# Patient Record
Sex: Male | Born: 1940 | Race: White | Hispanic: No
Health system: Southern US, Community
[De-identification: ages and names within clinical notes are randomized; demographics above are authoritative.]

## PROBLEM LIST (undated history)

## (undated) ENCOUNTER — Emergency Department (HOSPITAL_COMMUNITY): Admission: EM | Payer: Medicare Other

## (undated) DIAGNOSIS — I509 Heart failure, unspecified: Secondary | ICD-10-CM

## (undated) DIAGNOSIS — I82409 Acute embolism and thrombosis of unspecified deep veins of unspecified lower extremity: Secondary | ICD-10-CM

## (undated) DIAGNOSIS — I70229 Atherosclerosis of native arteries of extremities with rest pain, unspecified extremity: Secondary | ICD-10-CM

## (undated) DIAGNOSIS — I998 Other disorder of circulatory system: Secondary | ICD-10-CM

## (undated) DIAGNOSIS — I739 Peripheral vascular disease, unspecified: Secondary | ICD-10-CM

## (undated) DIAGNOSIS — R351 Nocturia: Secondary | ICD-10-CM

## (undated) DIAGNOSIS — M2540 Effusion, unspecified joint: Secondary | ICD-10-CM

## (undated) DIAGNOSIS — I779 Disorder of arteries and arterioles, unspecified: Secondary | ICD-10-CM

## (undated) DIAGNOSIS — I10 Essential (primary) hypertension: Secondary | ICD-10-CM

## (undated) DIAGNOSIS — E785 Hyperlipidemia, unspecified: Secondary | ICD-10-CM

## (undated) DIAGNOSIS — I2699 Other pulmonary embolism without acute cor pulmonale: Secondary | ICD-10-CM

## (undated) HISTORY — DX: Peripheral vascular disease, unspecified: I73.9

## (undated) HISTORY — DX: Disorder of arteries and arterioles, unspecified: I77.9

## (undated) HISTORY — PX: OTHER SURGICAL HISTORY: SHX169

## (undated) HISTORY — DX: Acute embolism and thrombosis of unspecified deep veins of unspecified lower extremity: I82.409

## (undated) HISTORY — DX: Other pulmonary embolism without acute cor pulmonale: I26.99

## (undated) HISTORY — DX: Other disorder of circulatory system: I99.8

## (undated) HISTORY — DX: Atherosclerosis of native arteries of extremities with rest pain, unspecified extremity: I70.229

## (undated) HISTORY — PX: BACK SURGERY: SHX140

---

## 2012-06-13 ENCOUNTER — Ambulatory Visit: Payer: Self-pay

## 2013-07-02 ENCOUNTER — Emergency Department (HOSPITAL_COMMUNITY)
Admission: EM | Admit: 2013-07-02 | Discharge: 2013-07-02 | Disposition: A | Discharge status: Home / Self Care | Payer: Medicare Other | Attending: Emergency Medicine | Admitting: Emergency Medicine

## 2013-07-02 ENCOUNTER — Encounter (HOSPITAL_COMMUNITY): Payer: Self-pay | Admitting: Emergency Medicine

## 2013-07-02 DIAGNOSIS — R42 Dizziness and giddiness: Secondary | ICD-10-CM | POA: Insufficient documentation

## 2013-07-02 DIAGNOSIS — R5381 Other malaise: Secondary | ICD-10-CM | POA: Insufficient documentation

## 2013-07-02 DIAGNOSIS — R5383 Other fatigue: Secondary | ICD-10-CM

## 2013-07-02 DIAGNOSIS — I10 Essential (primary) hypertension: Secondary | ICD-10-CM | POA: Insufficient documentation

## 2013-07-02 DIAGNOSIS — Z79899 Other long term (current) drug therapy: Secondary | ICD-10-CM | POA: Insufficient documentation

## 2013-07-02 DIAGNOSIS — K529 Noninfective gastroenteritis and colitis, unspecified: Secondary | ICD-10-CM

## 2013-07-02 DIAGNOSIS — Z87891 Personal history of nicotine dependence: Secondary | ICD-10-CM | POA: Insufficient documentation

## 2013-07-02 DIAGNOSIS — K5289 Other specified noninfective gastroenteritis and colitis: Secondary | ICD-10-CM | POA: Insufficient documentation

## 2013-07-02 HISTORY — DX: Essential (primary) hypertension: I10

## 2013-07-02 LAB — CBC WITH DIFFERENTIAL/PLATELET
BASOS ABS: 0 10*3/uL (ref 0.0–0.1)
Basophils Relative: 0 % (ref 0–1)
EOS ABS: 0 10*3/uL (ref 0.0–0.7)
EOS PCT: 0 % (ref 0–5)
HCT: 46.1 % (ref 39.0–52.0)
Hemoglobin: 15.3 g/dL (ref 13.0–17.0)
LYMPHS PCT: 5 % — AB (ref 12–46)
Lymphs Abs: 0.8 10*3/uL (ref 0.7–4.0)
MCH: 32.3 pg (ref 26.0–34.0)
MCHC: 33.2 g/dL (ref 30.0–36.0)
MCV: 97.3 fL (ref 78.0–100.0)
Monocytes Absolute: 0.7 10*3/uL (ref 0.1–1.0)
Monocytes Relative: 5 % (ref 3–12)
NEUTROS PCT: 90 % — AB (ref 43–77)
Neutro Abs: 13.9 10*3/uL — ABNORMAL HIGH (ref 1.7–7.7)
PLATELETS: 201 10*3/uL (ref 150–400)
RBC: 4.74 MIL/uL (ref 4.22–5.81)
RDW: 12.7 % (ref 11.5–15.5)
WBC: 15.5 10*3/uL — ABNORMAL HIGH (ref 4.0–10.5)

## 2013-07-02 LAB — URINALYSIS, ROUTINE W REFLEX MICROSCOPIC
BILIRUBIN URINE: NEGATIVE
Glucose, UA: NEGATIVE mg/dL
HGB URINE DIPSTICK: NEGATIVE
Ketones, ur: NEGATIVE mg/dL
Leukocytes, UA: NEGATIVE
Nitrite: NEGATIVE
Protein, ur: 100 mg/dL — AB
Specific Gravity, Urine: 1.028 (ref 1.005–1.030)
Urobilinogen, UA: 0.2 mg/dL (ref 0.0–1.0)
pH: 5 (ref 5.0–8.0)

## 2013-07-02 LAB — COMPREHENSIVE METABOLIC PANEL
ALK PHOS: 49 U/L (ref 39–117)
ALT: 18 U/L (ref 0–53)
AST: 18 U/L (ref 0–37)
Albumin: 4.4 g/dL (ref 3.5–5.2)
BUN: 28 mg/dL — ABNORMAL HIGH (ref 6–23)
CO2: 21 meq/L (ref 19–32)
Calcium: 9.6 mg/dL (ref 8.4–10.5)
Chloride: 105 mEq/L (ref 96–112)
Creatinine, Ser: 1.16 mg/dL (ref 0.50–1.35)
GFR calc Af Amer: 71 mL/min — ABNORMAL LOW (ref >90)
GFR calc non Af Amer: 61 mL/min — ABNORMAL LOW (ref >90)
GLUCOSE: 132 mg/dL — AB (ref 70–99)
POTASSIUM: 4.3 meq/L (ref 3.7–5.3)
Sodium: 141 mEq/L (ref 137–147)
TOTAL PROTEIN: 7.8 g/dL (ref 6.0–8.3)
Total Bilirubin: 0.5 mg/dL (ref 0.3–1.2)

## 2013-07-02 LAB — LIPASE, BLOOD: Lipase: 22 U/L (ref 11–59)

## 2013-07-02 LAB — URINE MICROSCOPIC-ADD ON

## 2013-07-02 MED ORDER — DIPHENOXYLATE-ATROPINE 2.5-0.025 MG PO TABS: 1.0000 | ORAL_TABLET | Freq: Four times a day (QID) | ORAL | Status: DC | PRN

## 2013-07-02 MED ORDER — ONDANSETRON 4 MG PO TBDP: 4.0000 mg | ORAL_TABLET | Freq: Three times a day (TID) | ORAL | Status: DC | PRN

## 2013-07-02 MED ORDER — DIPHENOXYLATE-ATROPINE 2.5-0.025 MG PO TABS
2.0000 | ORAL_TABLET | Freq: Once | ORAL | Status: AC
  Administered 2013-07-02: 2 via ORAL
  Filled 2013-07-02: qty 2
  Filled 2013-07-02: qty 1

## 2013-07-02 MED ORDER — SODIUM CHLORIDE 0.9 % IV BOLUS (SEPSIS)
1000.0000 mL | Freq: Once | INTRAVENOUS | Status: AC
  Administered 2013-07-02: 1000 mL via INTRAVENOUS

## 2013-07-02 MED ORDER — ONDANSETRON HCL 4 MG/2ML IJ SOLN
4.0000 mg | Freq: Once | INTRAMUSCULAR | Status: AC
  Administered 2013-07-02: 4 mg via INTRAVENOUS
  Filled 2013-07-02: qty 2

## 2013-07-02 NOTE — Discharge Instructions (Signed)
Viral Gastroenteritis °Viral gastroenteritis is also called stomach flu. This illness is caused by a certain type of germ (virus). It can cause sudden watery poop (diarrhea) and throwing up (vomiting). This can cause you to lose body fluids (dehydration). This illness usually lasts for 3 to 8 days. It usually goes away on its own. °HOME CARE  °· Drink enough fluids to keep your pee (urine) clear or pale yellow. Drink small amounts of fluids often. °· Ask your doctor how to replace body fluid losses (rehydration). °· Avoid: °· Foods high in sugar. °· Alcohol. °· Bubbly (carbonated) drinks. °· Tobacco. °· Juice. °· Caffeine drinks. °· Very hot or cold fluids. °· Fatty, greasy foods. °· Eating too much at one time. °· Dairy products until 24 to 48 hours after your watery poop stops. °· You may eat foods with active cultures (probiotics). They can be found in some yogurts and supplements. °· Wash your hands well to avoid spreading the illness. °· Only take medicines as told by your doctor. Do not give aspirin to children. Do not take medicines for watery poop (antidiarrheals). °· Ask your doctor if you should keep taking your regular medicines. °· Keep all doctor visits as told. °GET HELP RIGHT AWAY IF:  °· You cannot keep fluids down. °· You do not pee at least once every 6 to 8 hours. °· You are short of breath. °· You see blood in your poop or throw up. This may look like coffee grounds. °· You have belly (abdominal) pain that gets worse or is just in one small spot (localized). °· You keep throwing up or having watery poop. °· You have a fever. °· The patient is a child younger than 3 months, and he or she has a fever. °· The patient is a child older than 3 months, and he or she has a fever and problems that do not go away. °· The patient is a child older than 3 months, and he or she has a fever and problems that suddenly get worse. °· The patient is a baby, and he or she has no tears when crying. °MAKE SURE YOU:    °· Understand these instructions. °· Will watch your condition. °· Will get help right away if you are not doing well or get worse. °Document Released: 09/15/2007 Document Revised: 06/21/2011 Document Reviewed: 01/13/2011 °[employer]® Patient Information ©2014 [employer]. ° °

## 2013-07-02 NOTE — ED Provider Notes (Signed)
CSN: [id number]     Arrival date & time 07/02/13  1247 History   First MD Initiated Contact with Patient 07/02/13 1407     Chief Complaint  Patient presents with  . Vomiting  . Diarrhea  . Weakness  . Dizziness      HPI  Patient presents with nausea vomiting diarrhea. His wife went to a similar episode of the last 2 days. He started last night with nausea vomiting diarrhea. No blood pus or mucus in his stools. Heme-negative nonbilious emesis. No abdominal pain. Minimal abdominal cramping. No travel. He lives in town. He runs a burger joint.  Past Medical History  Diagnosis Date  . Hypertension    History reviewed. No pertinent past surgical history. No family history on file. History  Substance Use Topics  . Smoking status: Former Games developermoker  . Smokeless tobacco: Not on file  . Alcohol Use: No    Review of Systems  Constitutional: Negative for fever, chills, diaphoresis, appetite change and fatigue.  HENT: Negative for mouth sores, sore throat and trouble swallowing.   Eyes: Negative for visual disturbance.  Respiratory: Negative for cough, chest tightness, shortness of breath and wheezing.   Cardiovascular: Negative for chest pain.  Gastrointestinal: Positive for nausea, vomiting and diarrhea. Negative for abdominal distention.  Endocrine: Negative for polydipsia, polyphagia and polyuria.  Genitourinary: Negative for dysuria, frequency and hematuria.  Musculoskeletal: Negative for gait problem.  Skin: Negative for color change, pallor and rash.  Neurological: Negative for dizziness, syncope, light-headedness and headaches.  Hematological: Does not bruise/bleed easily.  Psychiatric/Behavioral: Negative for behavioral problems and confusion.      Allergies  Sulfur  Home Medications   Current Outpatient Rx  Name  Route  Sig  Dispense  Refill  . acetaminophen (TYLENOL) 500 MG tablet   Oral   Take 1,500 mg by mouth every 6 (six) hours as needed.         . ramipril  (ALTACE) 10 MG capsule   Oral   Take 10 mg by mouth daily.         . diphenoxylate-atropine (LOMOTIL) 2.5-0.025 MG per tablet   Oral   Take 1 tablet by mouth 4 (four) times daily as needed for diarrhea or loose stools.   30 tablet   0   . ondansetron (ZOFRAN ODT) 4 MG disintegrating tablet   Oral   Take 1 tablet (4 mg total) by mouth every 8 (eight) hours as needed for nausea.   6 tablet   0    BP 172/55  Pulse 67  Temp(Src) 97.5 F (36.4 C) (Oral)  SpO2 99% Physical Exam  Constitutional: He is oriented to person, place, and time. He appears well-developed and well-nourished. No distress.  HENT:  Head: Normocephalic.  Eyes: Conjunctivae are normal. Pupils are equal, round, and reactive to light. No scleral icterus.  Neck: Normal range of motion. Neck supple. No thyromegaly present.  Cardiovascular: Normal rate and regular rhythm.  Exam reveals no gallop and no friction rub.   No murmur heard. Pulmonary/Chest: Effort normal and breath sounds normal. No respiratory distress. He has no wheezes. He has no rales.  Abdominal: Soft. Bowel sounds are normal. He exhibits no distension. There is no tenderness. There is no rebound.  Abdomen soft and benign.  Musculoskeletal: Normal range of motion.  Neurological: He is alert and oriented to person, place, and time.  Skin: Skin is warm and dry. No rash noted.  Psychiatric: He has a normal mood and affect.  His behavior is normal.    ED Course  Procedures (including critical care time) Labs Review Labs Reviewed  CBC WITH DIFFERENTIAL - Abnormal; Notable for the following:    WBC 15.5 (*)    Neutrophils Relative % 90 (*)    Neutro Abs 13.9 (*)    Lymphocytes Relative 5 (*)    All other components within normal limits  COMPREHENSIVE METABOLIC PANEL - Abnormal; Notable for the following:    Glucose, Bld 132 (*)    BUN 28 (*)    GFR calc non Af Amer 61 (*)    GFR calc Af Amer 71 (*)    All other components within normal limits   URINALYSIS, ROUTINE W REFLEX MICROSCOPIC - Abnormal; Notable for the following:    APPearance CLOUDY (*)    Protein, ur 100 (*)    All other components within normal limits  URINE MICROSCOPIC-ADD ON - Abnormal; Notable for the following:    Bacteria, UA FEW (*)    Casts GRANULAR CAST (*)    All other components within normal limits  LIPASE, BLOOD   Imaging Review No results found.   EKG Interpretation None      MDM   Final diagnoses:  Gastroenteritis    Vision feeling much improved after some hydration, Lomotil, Zofran. Taking by mouth. No additional episodes of diarrhea. Benign abdomen. Plan be home. Clear liquids. Symptomatic treatment Zofran Lomotil. Recheck with bloody stools. Emesis. Decreased urine output. Abdominal pain.    Rolland Porter, MD 07/02/13 [phone]

## 2013-07-02 NOTE — ED Notes (Signed)
Pt c/o n/v/d, weakness and dizziness that started yesterday. Pt states he always has HTN. Pt states that "all I need is an antibiotic and Ill be good".

## 2013-12-12 ENCOUNTER — Ambulatory Visit: Payer: Medicare Other | Admitting: Cardiovascular Disease

## 2013-12-24 ENCOUNTER — Ambulatory Visit (INDEPENDENT_AMBULATORY_CARE_PROVIDER_SITE_OTHER): Payer: Medicare Other | Admitting: Podiatry

## 2013-12-24 ENCOUNTER — Encounter: Payer: Self-pay | Admitting: Podiatry

## 2013-12-24 ENCOUNTER — Telehealth (HOSPITAL_COMMUNITY): Payer: Self-pay | Admitting: *Deleted

## 2013-12-24 ENCOUNTER — Ambulatory Visit (INDEPENDENT_AMBULATORY_CARE_PROVIDER_SITE_OTHER): Payer: Medicare Other

## 2013-12-24 VITALS — BP 179/91 | HR 84 | Resp 16

## 2013-12-24 DIAGNOSIS — L97509 Non-pressure chronic ulcer of other part of unspecified foot with unspecified severity: Secondary | ICD-10-CM

## 2013-12-24 DIAGNOSIS — I739 Peripheral vascular disease, unspecified: Secondary | ICD-10-CM

## 2013-12-24 DIAGNOSIS — M216X9 Other acquired deformities of unspecified foot: Secondary | ICD-10-CM

## 2013-12-24 MED ORDER — HYDROCODONE-ACETAMINOPHEN 10-325 MG PO TABS: 1.0000 | ORAL_TABLET | Freq: Three times a day (TID) | ORAL | Status: DC | PRN

## 2013-12-24 NOTE — Progress Notes (Signed)
   Subjective:    Patient ID: Kevin Reilly, male    DOB: June 21, 1940, 73 y.o.   MRN: [id number]  HPI Comments: "My feet hurt so bad"  Patient c/o throbbing sub 5th MPJ right for about 3 weeks. He had a callus there initially and saw Dr. Elvin So and he numbed and shaved down. Patient states that it was extremely sore afterwards and went back and he cut more skin without numbing. Now red and open sore. Patient asked for antibiotic cream but doc said didn't need it. He hasn't been using anything at home. Both feet get real sore plantar forefoot when walking.     Review of Systems  Musculoskeletal: Positive for gait problem.  All other systems reviewed and are negative.      Objective:   Physical Exam        Assessment & Plan:

## 2013-12-25 NOTE — Progress Notes (Signed)
Subjective:     Patient ID: Kevin Reilly, male   DOB: [birth year], 73 y.o.   MRN: [id number]  HPI patient states that he had a lesion underneath the fifth metatarsal right that has been painful for about 4 weeks and was treated by another physician with injection and trimming and that seems to have made it somewhat worse. Also complains that the forefoot of both feet are hurting him and that it seems like it is getting worse. It can wake him up at night   Review of Systems  All other systems reviewed and are negative.      Objective:   Physical Exam  Nursing note and vitals reviewed. Constitutional: He is oriented to person, place, and time.  Musculoskeletal: Normal range of motion.  Neurological: He is oriented to person, place, and time.  Skin: Skin is warm and dry.   I noted circulatory status to be diminished both DP and PT pulses bilateral with mild clonus to the forefoot of both feet and diminished hair growth. Patient is noted to have normal neurological sensation I did note that there is a small breakdown of tissue around the fifth metatarsal head right measuring about 3 x 3 mm localized in nature with no proximal edema erythema or drainage noted. Patient has irritation around the first metatarsal heads plantar both feet and slightly on the fifth metatarsal head left    Assessment:     Possibility for PVD creating breakdown of tissue versus possibilities of some kind of inflammatory complex    Plan:     H&P and x-rays reviewed. Today I cleaned the area on the fifth metatarsal right did not note any drainage flushed the area and applied a small amount of Iodosorb with padding and gave him pads instructed on padding usage around the areas that are irritated along with soaks and Neosporin for the right fifth metatarsal head. I am sending for vascular evaluation to see whether or not this may be a circulatory issue creating the problems he is experiencing and I gave him strict  instructions if any increased redness swelling or drainage should occur he is to go straight to the emergency room and contact us. If not we will get the results of vascular studies and see him back in 2 weeks

## 2013-12-28 ENCOUNTER — Telehealth: Payer: Self-pay | Admitting: *Deleted

## 2013-12-28 MED ORDER — CEPHALEXIN 500 MG PO CAPS: 500.0000 mg | ORAL_CAPSULE | Freq: Three times a day (TID) | ORAL | Status: DC

## 2013-12-28 NOTE — Telephone Encounter (Signed)
Kevin Reilly, thank you.

## 2013-12-28 NOTE — Telephone Encounter (Signed)
I'm trying to get in touch with Dr. Charlsie Merles.  Thank you!  I attempted to call the patient with no success, he wasn't at work and voicemail was full on his mobile.

## 2013-12-28 NOTE — Telephone Encounter (Signed)
Kevin Reilly, call me back.

## 2013-12-28 NOTE — Telephone Encounter (Signed)
I called and informed the patient that I tried to call him back around 2pm.  He stated, "I had to step out a minute."  I informed him that Dr. Charlsie Merles said he gave you a prescription for pain medication already.  He said he can give you an antibiotic.  I will send it to your pharmacy.  He stated, "So he's cutting me off huh.  I been taking 2 pills at a time sometime because the pain is so bad.  Hopefully the antibiotic will help."

## 2013-12-28 NOTE — Telephone Encounter (Signed)
I'm trying to get in touch with Dr. Charlsie Merles.  What it is, I need an antibiotic and some pain medicine.  My foot is still hurting.  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  Thank you.

## 2013-12-31 ENCOUNTER — Telehealth: Payer: Self-pay | Admitting: *Deleted

## 2013-12-31 NOTE — Telephone Encounter (Signed)
I'm trying to get in touch with Dr. Charlsie Merles to find out if you called something in for me.  I'm fixing to leave from over here.  It don't make no difference to call a doctor.  Call was taken care of on Friday.

## 2014-01-01 ENCOUNTER — Encounter: Payer: Self-pay | Admitting: Cardiovascular Disease

## 2014-01-01 ENCOUNTER — Ambulatory Visit (HOSPITAL_COMMUNITY)
Admission: RE | Admit: 2014-01-01 | Discharge: 2014-01-01 | Disposition: A | Discharge status: Home / Self Care | Payer: Medicare Other | Source: Ambulatory Visit | Attending: Cardiovascular Disease | Admitting: Cardiovascular Disease

## 2014-01-01 ENCOUNTER — Ambulatory Visit (INDEPENDENT_AMBULATORY_CARE_PROVIDER_SITE_OTHER): Payer: Medicare Other | Admitting: Cardiovascular Disease

## 2014-01-01 VITALS — BP 210/67 | HR 50 | Ht 71.0 in | Wt 186.3 lb

## 2014-01-01 DIAGNOSIS — I70229 Atherosclerosis of native arteries of extremities with rest pain, unspecified extremity: Secondary | ICD-10-CM

## 2014-01-01 DIAGNOSIS — I739 Peripheral vascular disease, unspecified: Secondary | ICD-10-CM | POA: Diagnosis not present

## 2014-01-01 DIAGNOSIS — I10 Essential (primary) hypertension: Secondary | ICD-10-CM | POA: Insufficient documentation

## 2014-01-01 DIAGNOSIS — M79609 Pain in unspecified limb: Secondary | ICD-10-CM | POA: Insufficient documentation

## 2014-01-01 DIAGNOSIS — R0989 Other specified symptoms and signs involving the circulatory and respiratory systems: Secondary | ICD-10-CM

## 2014-01-01 DIAGNOSIS — R600 Localized edema: Secondary | ICD-10-CM | POA: Insufficient documentation

## 2014-01-01 DIAGNOSIS — I999 Unspecified disorder of circulatory system: Secondary | ICD-10-CM

## 2014-01-01 DIAGNOSIS — I998 Other disorder of circulatory system: Secondary | ICD-10-CM | POA: Insufficient documentation

## 2014-01-01 DIAGNOSIS — L97509 Non-pressure chronic ulcer of other part of unspecified foot with unspecified severity: Secondary | ICD-10-CM | POA: Diagnosis not present

## 2014-01-01 MED ORDER — HYDROCHLOROTHIAZIDE 12.5 MG PO CAPS: 12.5000 mg | ORAL_CAPSULE | Freq: Every day | ORAL | Status: DC

## 2014-01-01 NOTE — Assessment & Plan Note (Signed)
On Altace. His blood pressure today is 210/67. He does have 2-3+ pitting lower extremity edema. I'm going to start him on low-dose hydrochlorothiazide and with recheck lab work in 2-3 weeks.

## 2014-01-01 NOTE — Telephone Encounter (Signed)
He should be scheduled for vascular study. Confirm that he has appointment

## 2014-01-01 NOTE — Progress Notes (Signed)
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 He was referred by Dr. Charlsie Merles for evaluation of critical limb ischemia. He has a history of 30-60 pack years of tobacco abuse having quit 20 years ago. History of hypertension as well. Never had a heart attack or stroke, denies chest pain or shortness of breath. He developed chronic pain in his right foot up until a month ago with erythema. He had instrumentation of the foot now has a nonhealing ulcer on the lateral plantar surface. Lower extremity arterial Doppler studies performed in our office today revealed an occluded right common femoral artery and right SFA. His left ABI is 0.43 with high-grade left common femoral artery stenosis, common iliac artery stenosis and an occluded SFA at the origin.   Current Outpatient Prescriptions  Medication Sig Dispense Refill  . acetaminophen (TYLENOL) 500 MG tablet Take 1,500 mg by mouth every 6 (six) hours as needed.      . cephALEXin (KEFLEX) 500 MG capsule Take 1 capsule (500 mg total) by mouth 3 (three) times daily.  30 capsule  0  . HYDROcodone-acetaminophen (NORCO) 10-325 MG per tablet Take 1 tablet by mouth every 8 (eight) hours as needed.  30 tablet  0  . ramipril (ALTACE) 10 MG capsule Take 10 mg by mouth daily.      . hydrochlorothiazide (MICROZIDE) 12.5 MG capsule Take 1 capsule (12.5 mg total) by mouth daily.  90 capsule  3   No current facility-administered medications for this visit.    Allergies  Allergen Reactions  . Sulfur Rash    History   Social History  . Marital Status: Married    Spouse Name: N/A    Number of Children: N/A  . Years of Education: N/A   Occupational History  . Not on file.   Social History Main Topics  . Smoking status: Never Smoker    . Smokeless tobacco: Not on file  . Alcohol Use: No  . Drug Use: Not on file  . Sexual Activity: Not on file   Other Topics Concern  . Not on file   Social History Narrative  . No narrative on file     Review of Systems: General: negative for chills, fever, night sweats or weight changes.  Cardiovascular: negative for chest pain, dyspnea on exertion, edema, orthopnea, palpitations, paroxysmal nocturnal dyspnea or shortness of breath Dermatological: negative for rash Respiratory: negative for cough or wheezing Urologic: negative for hematuria Abdominal: negative for nausea, vomiting, diarrhea, bright red blood per rectum, melena, or hematemesis Neurologic: negative for visual changes, syncope, or dizziness All other systems reviewed and are otherwise negative except as noted above.    Blood pressure 210/67, pulse 50, height 5\' 11"  (1.803 m), weight 186 lb 4.8 oz (84.505 kg).  General appearance: alert and no distress Neck: no adenopathy, no JVD, supple, symmetrical, trachea midline, thyroid not enlarged, symmetric, no tenderness/mass/nodules and bilateral carotid bruits Lungs: clear to auscultation bilaterally Heart: regular rate and rhythm, S1, S2 normal, no murmur, click, rub or gallop Extremities: 2+ pitting edema, erythema right foot, ulcer lateral plantar aspect right foot  EKG not performed today  ASSESSMENT AND PLAN:   Essential hypertension On Altace. His blood pressure today is 210/67. He does have 2-3+ pitting lower extremity edema. I'm  going to start him on low-dose hydrochlorothiazide and with recheck lab work in 2-3 weeks.  Critical lower limb ischemia Patient was referred to Dr. Verlan Friends for critical limb ischemia. He is an ulcer on the plantar aspect of his right fifth toe after instrumentation. He has erythema of his right foot and chronic pain for the last one month. Lower extremity until Doppler studies were office today reveal an occluded right common  femoral artery and SFA. He'll be angiography and potential intervention.      Runell Gess MD FACP,FACC,FAHA, FSCAI 01/01/2014 4:09 PM '

## 2014-01-01 NOTE — Patient Instructions (Signed)
  We will see you back in follow up in 6 weeks with Dr Allyson Sabal.   Dr Allyson Sabal has ordered: Carotid Duplex- This test is an ultrasound of the carotid arteries in your neck. It looks at blood flow through these arteries that supply the brain with blood. Allow one hour for this exam. There are no restrictions or special instructions.   Start Hydrocholorothiazide 12.5mg  daily for your swelling and high blood pressure.

## 2014-01-01 NOTE — Assessment & Plan Note (Signed)
Patient was referred to Dr. Verlan Friends for critical limb ischemia. He is an ulcer on the plantar aspect of his right fifth toe after instrumentation. He has erythema of his right foot and chronic pain for the last one month. Lower extremity until Doppler studies were office today reveal an occluded right common femoral artery and SFA. He'll be angiography and potential intervention.

## 2014-01-01 NOTE — Progress Notes (Signed)
Lower Extremity Arterial Duplex Completed. Evidence for moderate to severe arterial insufficiency bilaterally. Brianna L Mazza,RVT

## 2014-01-03 ENCOUNTER — Telehealth: Payer: Self-pay | Admitting: *Deleted

## 2014-01-03 NOTE — Telephone Encounter (Signed)
I called the patient and asked how he was doing per Dr. Charlsie Merles.  He stated, "I'm canceling all of my appointments right now all of them.  I told him that Dr. Charlsie Merles wants to stress the importance of following through with Dr. Allyson Sabal, he's concerned about you losing a foot or leg.  He stated, "I'm canceling all my appointments right now!"  I tried to stress the importance again but he cut me off and said, "I'm canceling all my appointments for now, thanks for calling, good-bye."  I informed Dr. Charlsie Merles.

## 2014-01-04 ENCOUNTER — Telehealth: Payer: Self-pay | Admitting: *Deleted

## 2014-01-04 NOTE — Telephone Encounter (Signed)
I'm going to cancel my appointment for Monday.  Thank you.  I called and asked the patient if he wanted to reschedule his appointment.  He stated, "No I'm scheduled to see Dr. Allyson Sabal on Tuesday so I thought I'd take care of everything with him.  But I would like a refill for my pain medication.  I'm out of it and my feet are killing me plus I got some issues with my legs.  If he can call into my pharmacy."  I told him Dr. Charlsie Merles is not in the office.  I will try to get in touch with him but can't guarantee.  I told him if Dr. Leitha Bleak a prescription it can't be called into the pharmacy, it will have to be picked up from the office because it is a controlled substance.  He stated, "Okay."

## 2014-01-04 NOTE — Telephone Encounter (Signed)
I already left my name and everything.  Y'all ain't call me back back.  I called and informed him I wasn't able to get in touch with Dr. Charlsie Merles not able to prescribe any pain medication.  He stated, "Okay, bye."

## 2014-01-07 ENCOUNTER — Ambulatory Visit: Payer: Medicare Other | Admitting: Podiatry

## 2014-01-07 ENCOUNTER — Telehealth: Payer: Self-pay | Admitting: Cardiovascular Disease

## 2014-01-07 NOTE — Telephone Encounter (Signed)
Closed encounter °

## 2014-01-08 ENCOUNTER — Ambulatory Visit (HOSPITAL_COMMUNITY)
Admission: RE | Admit: 2014-01-08 | Discharge: 2014-01-08 | Disposition: A | Discharge status: Home / Self Care | Payer: Medicare Other | Source: Ambulatory Visit | Attending: Cardiovascular Disease | Admitting: Cardiovascular Disease

## 2014-01-08 DIAGNOSIS — R0989 Other specified symptoms and signs involving the circulatory and respiratory systems: Secondary | ICD-10-CM | POA: Diagnosis present

## 2014-01-08 NOTE — Progress Notes (Signed)
Carotid Duplex Completed. °Brianna L Mazza,RVT °

## 2014-01-18 ENCOUNTER — Telehealth: Payer: Self-pay | Admitting: *Deleted

## 2014-01-18 NOTE — Telephone Encounter (Signed)
I can't remember the doctor's name.  I'm trying to see if he'll put me on an antibiotic because my foot is turning red.  I'd like to get on an antibiotic.  I attempted to call the patient, his line was busy.

## 2014-01-22 NOTE — Telephone Encounter (Signed)
This patient needs to be seen

## 2014-01-22 NOTE — Telephone Encounter (Signed)
I called and informed the patient that Dr. Charlsie Merles wants you to schedule an appointment to see him.  He asked, "When, what do you have?  I told him I was going to transfer him to a scheduler.

## 2014-12-20 ENCOUNTER — Other Ambulatory Visit: Payer: Self-pay | Admitting: *Deleted

## 2014-12-20 DIAGNOSIS — L97513 Non-pressure chronic ulcer of other part of right foot with necrosis of muscle: Secondary | ICD-10-CM

## 2015-01-13 ENCOUNTER — Encounter: Payer: Medicare Other | Admitting: Surgery

## 2015-01-13 ENCOUNTER — Encounter (HOSPITAL_COMMUNITY): Payer: Medicare Other

## 2015-01-21 ENCOUNTER — Encounter: Payer: Self-pay | Admitting: Vascular Surgery

## 2015-01-23 ENCOUNTER — Encounter: Payer: Self-pay | Admitting: Vascular Surgery

## 2015-01-23 ENCOUNTER — Other Ambulatory Visit: Payer: Self-pay | Admitting: *Deleted

## 2015-01-23 ENCOUNTER — Ambulatory Visit (INDEPENDENT_AMBULATORY_CARE_PROVIDER_SITE_OTHER): Payer: Medicare Other | Admitting: Vascular Surgery

## 2015-01-23 ENCOUNTER — Ambulatory Visit (HOSPITAL_COMMUNITY)
Admission: RE | Admit: 2015-01-23 | Discharge: 2015-01-23 | Disposition: A | Discharge status: Home / Self Care | Payer: Medicare Other | Source: Ambulatory Visit | Attending: Vascular Surgery | Admitting: Vascular Surgery

## 2015-01-23 ENCOUNTER — Encounter: Payer: Self-pay | Admitting: *Deleted

## 2015-01-23 VITALS — BP 199/66 | HR 53 | Temp 98.2°F | Ht 71.0 in | Wt 179.0 lb

## 2015-01-23 DIAGNOSIS — I739 Peripheral vascular disease, unspecified: Secondary | ICD-10-CM | POA: Diagnosis not present

## 2015-01-23 DIAGNOSIS — M79671 Pain in right foot: Secondary | ICD-10-CM

## 2015-01-23 DIAGNOSIS — R0989 Other specified symptoms and signs involving the circulatory and respiratory systems: Secondary | ICD-10-CM

## 2015-01-23 DIAGNOSIS — I10 Essential (primary) hypertension: Secondary | ICD-10-CM | POA: Diagnosis not present

## 2015-01-23 DIAGNOSIS — L97513 Non-pressure chronic ulcer of other part of right foot with necrosis of muscle: Secondary | ICD-10-CM | POA: Insufficient documentation

## 2015-01-23 MED ORDER — HYDROCODONE-ACETAMINOPHEN 10-325 MG PO TABS: 1.0000 | ORAL_TABLET | Freq: Three times a day (TID) | ORAL | Status: DC | PRN

## 2015-01-23 NOTE — Progress Notes (Signed)
[facility] [place] HISTORY AND PHYSICAL   History of Present Illness:  Patient is a 74 y.o. year old male who presents for evaluation of a chronic painful right fifth metatarsal ulcer. The patient states he has had this ulcer for several years. It was initially followed by a podiatrist. They told him he may have osteomyelitis. At that point he wished to see Dr. Lajoyce Corners for second opinion. Dr. Lajoyce Corners recently saw him in the office and referred him to [place] due to concerns over decreased perfusion in the right leg. The patient currently controls the pain in his right foot with a Lidoderm patch. He has pain right over the right fifth metatarsal. He does not really complain of drainage from this. He actively works daily at [employer]. He denies claudication symptoms. He denies prior myocardial infarction or stroke. He does have a history of hypertension which is controlled. He is a former tobacco abuser but quit 30 years ago. He also has chronic lower extremity edema. 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    Past Medical History  Diagnosis Date  . Hypertension   . Critical lower limb ischemia   . Peripheral arterial disease [facility])     Past Surgical History  Procedure Laterality Date  . Back surgery      Social History Social History  Substance Use Topics  . Smoking status: Former Smoker    Types: Cigarettes    Quit date: 01/22/1985  . Smokeless tobacco: None  . Alcohol Use: No    Family History No family history on file.  Allergies  Allergies  Allergen Reactions  . Sulfur Rash     Current Outpatient Prescriptions  Medication Sig Dispense Refill  . acetaminophen (TYLENOL) 500 MG tablet Take 1,500 mg by mouth every 6 (six) hours as needed.    Marland Kitchen HYDROcodone-acetaminophen (NORCO) 10-325 MG per tablet Take 1 tablet by mouth every 8 (eight) hours as needed. 30 tablet 0  . ramipril (ALTACE) 10 MG capsule Take 10 mg by mouth daily.    . cephALEXin (KEFLEX) 500  MG capsule Take 1 capsule (500 mg total) by mouth 3 (three) times daily. (Patient not taking: Reported on 01/23/2015) 30 capsule 0  . hydrochlorothiazide (MICROZIDE) 12.5 MG capsule Take 1 capsule (12.5 mg total) by mouth daily. (Patient not taking: Reported on 01/23/2015) 90 capsule 3   No current facility-administered medications for this visit.    ROS:   General:  No weight loss, Fever, chills  HEENT: No recent headaches, no nasal bleeding, no visual changes, no sore throat  Neurologic: No dizziness, blackouts, seizures. No recent symptoms of stroke or mini- stroke. No recent episodes of slurred speech, or temporary blindness.  Cardiac: No recent episodes of chest pain/pressure, no shortness of breath at rest.  No shortness of breath with exertion.  Denies history of atrial fibrillation or irregular heartbeat  Vascular: No history of rest pain in feet.  No history of claudication.  + history of non-healing ulcer, No history of DVT   Pulmonary: No home oxygen, no productive cough, no hemoptysis,  No asthma or wheezing  Musculoskeletal:  [x ] Arthritis, [ ]  Low back pain,  [ ]  Joint pain  Hematologic:No history of hypercoagulable state.  No history of easy bleeding.  No history of anemia  Gastrointestinal: No hematochezia or melena,  No gastroesophageal reflux, no trouble swallowing  Urinary: [ ]  chronic Kidney disease, [ ]  on HD - [ ]  MWF or [ ]   TTHS, [ ] Burning with urination, [ ] Frequent urination, [ ] Difficulty urinating;   Skin: No rashes  Psychological: No history of anxiety,  No history of depression   Physical Examination  Filed Vitals:   01/23/15 1517 01/23/15 1521  BP: 198/73 199/66  Pulse: 53   Temp: 98.2 F (36.8 C)   TempSrc: Oral   Height: 5' 11" (1.803 m)   Weight: 179 lb (81.194 kg)   SpO2: 99%     Body mass index is 24.98 kg/(m^2).  General:  Alert and oriented, no acute distress HEENT: Normal Neck: Bilateral carotid bruits left greater than  right Pulmonary: Clear to auscultation bilaterally Cardiac: Regular Rate and Rhythm without murmur Abdomen: Soft, non-tender, non-distended, no mass Skin: Diffuse circumferential hemosiderin staining gaiter area bilateral lower extremities, 2+ edema right lower extremity 1+ edema left lower extremity, punctate ulcer over right fifth metatarsal head plantar aspect of foot no drainage no erythema Extremity Pulses:  2+ radial, brachial, femoral, absent popliteal dorsalis pedis, posterior tibial pulses bilaterally Musculoskeletal:  edema as mentioned above  Neurologic: Upper and lower extremity motor 5/5 and symmetric  DATA:   Patient had bilateral ABIs performed today which were 0.3 on the right 0.52 on the left. I reviewed and interpreted this study. The patient also recently had a duplex ultrasound on September 22 at [facility]. This showed bilateral superficial femoral artery occlusions. There was also common femoral disease on the right side as well as the left   ASSESSMENT:  Severe bilateral lower extremity arterial occlusive disease. Possible osteomyelitis right foot #2 bilateral asymptomatic carotid bruits   PLAN:  #1 aortogram with bilateral lower extremity runoff possible intervention tomorrow. Risks benefits possible complications and procedure details of the arteriogram or intervention were discussed with the patient today. #2 possible osteomyelitis right foot. We will try to obtain an MRI exam of his right foot tomorrow while he is on his bed rest. #3 bilateral carotid bruits the patient will need a carotid duplex scan to evaluate to make sure he does not have high-grade carotid stenosis. Patient was given a prescription today for Vicodin No. 40 dispensed. He will also need to start aspirin daily. He would also benefit from a statin.  Charles Fields, MD [facility] [place] Office: [phone] Pager: [phone]   

## 2015-01-24 ENCOUNTER — Other Ambulatory Visit: Payer: Self-pay | Admitting: *Deleted

## 2015-01-24 ENCOUNTER — Ambulatory Visit (HOSPITAL_BASED_OUTPATIENT_CLINIC_OR_DEPARTMENT_OTHER): Payer: Medicare Other

## 2015-01-24 ENCOUNTER — Ambulatory Visit (HOSPITAL_COMMUNITY)
Admission: RE | Admit: 2015-01-24 | Discharge: 2015-01-24 | Disposition: A | Discharge status: Home / Self Care | Payer: Medicare Other | Source: Ambulatory Visit | Attending: Vascular Surgery | Admitting: Vascular Surgery

## 2015-01-24 ENCOUNTER — Encounter (HOSPITAL_COMMUNITY)
Admission: RE | Disposition: A | Discharge status: Home / Self Care | Payer: Self-pay | Source: Ambulatory Visit | Attending: Vascular Surgery

## 2015-01-24 DIAGNOSIS — I10 Essential (primary) hypertension: Secondary | ICD-10-CM | POA: Insufficient documentation

## 2015-01-24 DIAGNOSIS — I70229 Atherosclerosis of native arteries of extremities with rest pain, unspecified extremity: Secondary | ICD-10-CM

## 2015-01-24 DIAGNOSIS — Z01818 Encounter for other preprocedural examination: Secondary | ICD-10-CM

## 2015-01-24 DIAGNOSIS — I998 Other disorder of circulatory system: Secondary | ICD-10-CM

## 2015-01-24 DIAGNOSIS — I6523 Occlusion and stenosis of bilateral carotid arteries: Secondary | ICD-10-CM | POA: Diagnosis not present

## 2015-01-24 DIAGNOSIS — I739 Peripheral vascular disease, unspecified: Secondary | ICD-10-CM

## 2015-01-24 DIAGNOSIS — Z882 Allergy status to sulfonamides status: Secondary | ICD-10-CM | POA: Insufficient documentation

## 2015-01-24 DIAGNOSIS — R600 Localized edema: Secondary | ICD-10-CM | POA: Diagnosis not present

## 2015-01-24 DIAGNOSIS — L97819 Non-pressure chronic ulcer of other part of right lower leg with unspecified severity: Secondary | ICD-10-CM | POA: Diagnosis not present

## 2015-01-24 DIAGNOSIS — Z87891 Personal history of nicotine dependence: Secondary | ICD-10-CM | POA: Diagnosis not present

## 2015-01-24 DIAGNOSIS — L97519 Non-pressure chronic ulcer of other part of right foot with unspecified severity: Secondary | ICD-10-CM | POA: Diagnosis not present

## 2015-01-24 HISTORY — PX: PERIPHERAL VASCULAR CATHETERIZATION: SHX172C

## 2015-01-24 LAB — POCT I-STAT, CHEM 8
BUN: 27 mg/dL — ABNORMAL HIGH (ref 6–20)
CREATININE: 0.9 mg/dL (ref 0.61–1.24)
Calcium, Ion: 1.26 mmol/L (ref 1.13–1.30)
Chloride: 106 mmol/L (ref 101–111)
Glucose, Bld: 102 mg/dL — ABNORMAL HIGH (ref 65–99)
HEMATOCRIT: 39 % (ref 39.0–52.0)
HEMOGLOBIN: 13.3 g/dL (ref 13.0–17.0)
POTASSIUM: 4 mmol/L (ref 3.5–5.1)
Sodium: 142 mmol/L (ref 135–145)
TCO2: 24 mmol/L (ref 0–100)

## 2015-01-24 SURGERY — ABDOMINAL AORTOGRAM
Anesthesia: LOCAL

## 2015-01-24 MED ORDER — HEPARIN (PORCINE) IN NACL 2-0.9 UNIT/ML-% IJ SOLN
INTRAMUSCULAR | Status: DC | PRN
  Administered 2015-01-24: 11:00:00

## 2015-01-24 MED ORDER — OXYCODONE HCL 5 MG PO TABS
ORAL_TABLET | ORAL | Status: AC
  Filled 2015-01-24: qty 2

## 2015-01-24 MED ORDER — ACETAMINOPHEN 325 MG PO TABS: 325.0000 mg | ORAL_TABLET | ORAL | Status: DC | PRN

## 2015-01-24 MED ORDER — HEPARIN (PORCINE) IN NACL 2-0.9 UNIT/ML-% IJ SOLN
INTRAMUSCULAR | Status: AC
  Filled 2015-01-24: qty 1000

## 2015-01-24 MED ORDER — MORPHINE SULFATE (PF) 10 MG/ML IV SOLN: 2.0000 mg | INTRAVENOUS | Status: DC | PRN

## 2015-01-24 MED ORDER — ACETAMINOPHEN 325 MG RE SUPP: 325.0000 mg | RECTAL | Status: DC | PRN

## 2015-01-24 MED ORDER — MORPHINE SULFATE (PF) 2 MG/ML IV SOLN
INTRAVENOUS | Status: AC
  Administered 2015-01-24: 2 mg via INTRAVENOUS
  Filled 2015-01-24: qty 1

## 2015-01-24 MED ORDER — OXYCODONE HCL 5 MG PO TABS
5.0000 mg | ORAL_TABLET | ORAL | Status: DC | PRN
  Administered 2015-01-24: 10 mg via ORAL

## 2015-01-24 MED ORDER — HYDRALAZINE HCL 20 MG/ML IJ SOLN
INTRAMUSCULAR | Status: AC
  Filled 2015-01-24: qty 1

## 2015-01-24 MED ORDER — HYDRALAZINE HCL 20 MG/ML IJ SOLN: 5.0000 mg | INTRAMUSCULAR | Status: DC | PRN

## 2015-01-24 MED ORDER — SODIUM CHLORIDE 0.9 % IV SOLN
INTRAVENOUS | Status: DC
  Administered 2015-01-24: 10:00:00 via INTRAVENOUS

## 2015-01-24 MED ORDER — LABETALOL HCL 5 MG/ML IV SOLN: 10.0000 mg | INTRAVENOUS | Status: DC | PRN

## 2015-01-24 MED ORDER — SODIUM CHLORIDE 0.45 % IV SOLN
INTRAVENOUS | Status: DC
  Administered 2015-01-24: 12:00:00 via INTRAVENOUS

## 2015-01-24 MED ORDER — LIDOCAINE HCL (PF) 1 % IJ SOLN
INTRAMUSCULAR | Status: AC
  Filled 2015-01-24: qty 30

## 2015-01-24 MED ORDER — METOPROLOL TARTRATE 1 MG/ML IV SOLN: 2.0000 mg | INTRAVENOUS | Status: DC | PRN

## 2015-01-24 MED ORDER — HYDRALAZINE HCL 20 MG/ML IJ SOLN
INTRAMUSCULAR | Status: DC | PRN
  Administered 2015-01-24: 10 mg via INTRAVENOUS

## 2015-01-24 MED ORDER — IODIXANOL 320 MG/ML IV SOLN
INTRAVENOUS | Status: DC | PRN
  Administered 2015-01-24: 127 mL via INTRA_ARTERIAL

## 2015-01-24 MED ORDER — ONDANSETRON HCL 4 MG/2ML IJ SOLN: 4.0000 mg | Freq: Four times a day (QID) | INTRAMUSCULAR | Status: DC | PRN

## 2015-01-24 SURGICAL SUPPLY — 9 items
CATH ANGIO 5F PIGTAIL 65CM (CATHETERS) ×2 IMPLANT
COVER PRB 48X5XTLSCP FOLD TPE (BAG) ×1 IMPLANT
COVER PROBE 5X48 (BAG) ×1
KIT PV (KITS) ×2 IMPLANT
SHEATH PINNACLE 5F 10CM (SHEATH) ×2 IMPLANT
SYR MEDRAD MARK V 150ML (SYRINGE) ×2 IMPLANT
TRANSDUCER W/STOPCOCK (MISCELLANEOUS) ×2 IMPLANT
TRAY PV CATH (CUSTOM PROCEDURE TRAY) ×2 IMPLANT
WIRE HITORQ VERSACORE ST 145CM (WIRE) ×2 IMPLANT

## 2015-01-24 NOTE — Progress Notes (Signed)
VASCULAR LAB PRELIMINARY  PRELIMINARY  PRELIMINARY  PRELIMINARY  Carotid duplex completed.    Preliminary report:  Bilateral:  40-59% internal carotid artery stenosis upper end of scale. Acoustic shadowing may obscure higher velocities. Turbubelt flow noted. ECA stenosis. Vertebral artery flow is antegrade.  Kimya Mccahill, RVS 01/24/2015, 7:51 PM

## 2015-01-24 NOTE — Interval H&P Note (Signed)
History and Physical Interval Note:  01/24/2015 9:23 AM  Kevin Reilly  has presented today for surgery, with the diagnosis of pvd  The various methods of treatment have been discussed with the patient and family. After consideration of risks, benefits and other options for treatment, the patient has consented to  Procedure(s): Abdominal Aortogram (N/A) as a surgical intervention .  The patient's history has been reviewed, patient examined, no change in status, stable for surgery.  I have reviewed the patient's chart and labs.  Questions were answered to the patient's satisfaction.     Fabienne Bruns

## 2015-01-24 NOTE — Discharge Instructions (Signed)
Angiogram, Care After These instructions give you information about caring for yourself after your procedure. Your doctor may also give you more specific instructions. Call your doctor if you have any problems or questions after your procedure.  HOME CARE  Take medicines only as told by your doctor.  Follow your doctor's instructions about:  Care of the area where the tube was inserted.  Bandage (dressing) changes and removal.  You may shower 24-48 hours after the procedure or as told by your doctor.  Do not take baths, swim, or use a hot tub until your doctor approves.  Every day, check the area where the tube was inserted. Watch for:  Redness, swelling, or pain.  Fluid, blood, or pus.  Do not apply powder or lotion to the site.  Do not lift anything that is heavier than 10 lb (4.5 kg) for 5 days or as told by your doctor.  Do not drive or operate heavy machinery for 24 hours or as told by your doctor.  Have someone with you for the first 24 hours after the procedure.  Keep all follow-up visits as told by your doctor. This is important. GET HELP IF:  You have a fever.   You have chills.   You have more bleeding from the area where the tube was inserted. Hold pressure on the area and call 911.  You have redness, swelling, or pain in the area where the tube was inserted.  You have fluid or pus coming from the area. GET HELP RIGHT AWAY IF:   You have a lot of pain in the area where the tube was inserted.  The area where the tube was inserted is bleeding, and the bleeding does not stop after 30 minutes of holding steady pressure on the area.  The area near or just beyond the insertion site becomes pale, cool, tingly, or numb.   This information is not intended to replace advice given to you by your health care provider. Make sure you discuss any questions you have with your health care provider.   Document Released: 06/25/2008 Document Revised: 04/19/2014 Document  Reviewed: 08/30/2012 Elsevier Interactive Patient Education [employer]

## 2015-01-24 NOTE — Progress Notes (Signed)
Pt d/c'd from short stay. Taken to radiology via wheelchair for MRI and then to be taken to vascular lab for doppler studies.

## 2015-01-24 NOTE — H&P (View-Only) (Signed)
[facility] [place] HISTORY AND PHYSICAL   History of Present Illness:  Patient is a 74 y.o. year old male who presents for evaluation of a chronic painful right fifth metatarsal ulcer. The patient states he has had this ulcer for several years. It was initially followed by a podiatrist. They told him he may have osteomyelitis. At that point he wished to see Dr. Lajoyce Corners for second opinion. Dr. Lajoyce Corners recently saw him in the office and referred him to [place] due to concerns over decreased perfusion in the right leg. The patient currently controls the pain in his right foot with a Lidoderm patch. He has pain right over the right fifth metatarsal. He does not really complain of drainage from this. He actively works daily at [employer]. He denies claudication symptoms. He denies prior myocardial infarction or stroke. He does have a history of hypertension which is controlled. He is a former tobacco abuser but quit 30 years ago. He also has chronic lower extremity edema. 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    Past Medical History  Diagnosis Date  . Hypertension   . Critical lower limb ischemia   . Peripheral arterial disease [facility])     Past Surgical History  Procedure Laterality Date  . Back surgery      Social History Social History  Substance Use Topics  . Smoking status: Former Smoker    Types: Cigarettes    Quit date: 01/22/1985  . Smokeless tobacco: None  . Alcohol Use: No    Family History No family history on file.  Allergies  Allergies  Allergen Reactions  . Sulfur Rash     Current Outpatient Prescriptions  Medication Sig Dispense Refill  . acetaminophen (TYLENOL) 500 MG tablet Take 1,500 mg by mouth every 6 (six) hours as needed.    Marland Kitchen HYDROcodone-acetaminophen (NORCO) 10-325 MG per tablet Take 1 tablet by mouth every 8 (eight) hours as needed. 30 tablet 0  . ramipril (ALTACE) 10 MG capsule Take 10 mg by mouth daily.    . cephALEXin (KEFLEX) 500  MG capsule Take 1 capsule (500 mg total) by mouth 3 (three) times daily. (Patient not taking: Reported on 01/23/2015) 30 capsule 0  . hydrochlorothiazide (MICROZIDE) 12.5 MG capsule Take 1 capsule (12.5 mg total) by mouth daily. (Patient not taking: Reported on 01/23/2015) 90 capsule 3   No current facility-administered medications for this visit.    ROS:   General:  No weight loss, Fever, chills  HEENT: No recent headaches, no nasal bleeding, no visual changes, no sore throat  Neurologic: No dizziness, blackouts, seizures. No recent symptoms of stroke or mini- stroke. No recent episodes of slurred speech, or temporary blindness.  Cardiac: No recent episodes of chest pain/pressure, no shortness of breath at rest.  No shortness of breath with exertion.  Denies history of atrial fibrillation or irregular heartbeat  Vascular: No history of rest pain in feet.  No history of claudication.  + history of non-healing ulcer, No history of DVT   Pulmonary: No home oxygen, no productive cough, no hemoptysis,  No asthma or wheezing  Musculoskeletal:  [x ] Arthritis, [ ]  Low back pain,  [ ]  Joint pain  Hematologic:No history of hypercoagulable state.  No history of easy bleeding.  No history of anemia  Gastrointestinal: No hematochezia or melena,  No gastroesophageal reflux, no trouble swallowing  Urinary: [ ]  chronic Kidney disease, [ ]  on HD - [ ]  MWF or [ ]   TTHS,  Burning with urination,  Frequent urination,  Difficulty urinating;   Skin: No rashes  Psychological: No history of anxiety,  No history of depression   Physical Examination  Filed Vitals:   01/23/15 1517 01/23/15 1521  BP: 198/73 199/66  Pulse: 53   Temp: 98.2 F (36.8 C)   TempSrc: Oral   Height:  (1.803 m)   Weight: 179 lb (81.194 kg)   SpO2: 99%     Body mass index is 24.98 kg/(m^2).  General:  Alert and oriented, no acute distress HEENT: Normal Neck: Bilateral carotid bruits left greater than  right Pulmonary: Clear to auscultation bilaterally Cardiac: Regular Rate and Rhythm without murmur Abdomen: Soft, non-tender, non-distended, no mass Skin: Diffuse circumferential hemosiderin staining gaiter area bilateral lower extremities, 2+ edema right lower extremity 1+ edema left lower extremity, punctate ulcer over right fifth metatarsal head plantar aspect of foot no drainage no erythema Extremity Pulses:  2+ radial, brachial, femoral, absent popliteal dorsalis pedis, posterior tibial pulses bilaterally Musculoskeletal:  edema as mentioned above  Neurologic: Upper and lower extremity motor 5/5 and symmetric  DATA:   Patient had bilateral ABIs performed today which were 0.3 on the right 0.52 on the left. I reviewed and interpreted this study. The patient also recently had a duplex ultrasound on September 22 at [facility]. This showed bilateral superficial femoral artery occlusions. There was also common femoral disease on the right side as well as the left   ASSESSMENT:  Severe bilateral lower extremity arterial occlusive disease. Possible osteomyelitis right foot #2 bilateral asymptomatic carotid bruits   PLAN:  #1 aortogram with bilateral lower extremity runoff possible intervention tomorrow. Risks benefits possible complications and procedure details of the arteriogram or intervention were discussed with the patient today. #2 possible osteomyelitis right foot. We will try to obtain an MRI exam of his right foot tomorrow while he is on his bed rest. #3 bilateral carotid bruits the patient will need a carotid duplex scan to evaluate to make sure he does not have high-grade carotid stenosis. Patient was given a prescription today for Vicodin No. 40 dispensed. He will also need to start aspirin daily. He would also benefit from a statin.  Fabienne Bruns, MD [facility] [place] Office: [phone] Pager: [phone]

## 2015-01-24 NOTE — Progress Notes (Signed)
VASCULAR LAB PRELIMINARY  PRELIMINARY  PRELIMINARY  PRELIMINARY  Right Lower Extremity Vein Map    Right Great Saphenous Vein   Segment Diameter Comment  1. Origin 7.31mm   2. High Thigh 4.63mm   3. Mid Thigh 5.25mm   4. Low Thigh 6.4mm   5. At Knee 5.65mm   6. High Calf 4.3mm branch  7. Low Calf 2.19mm   8. Ankle 3.50mm varicosity    All areas of the Greater Saphenous Vein were compressible and patent    Eavan Gonterman, RVS 01/24/2015, 7:47 PM

## 2015-01-27 ENCOUNTER — Encounter (HOSPITAL_COMMUNITY): Payer: Self-pay | Admitting: Vascular Surgery

## 2015-01-28 ENCOUNTER — Telehealth: Payer: Self-pay | Admitting: Vascular Surgery

## 2015-01-28 NOTE — Telephone Encounter (Signed)
-----   Message from Sharee Pimple, RN sent at 01/24/2015 12:32 PM EDT ----- Regarding: schedule   ----- Message -----    From: Sherren Kerns, MD    Sent: 01/24/2015  11:28 AM      To: V[facility] Charge Pool  Aortogram with bilat runoff [place] left groin  He needs cardiology eval asap followed by Right femoral endarterectomy right fem pop.  Fabienne Bruns

## 2015-01-28 NOTE — Telephone Encounter (Signed)
Patient is established with Dr Erlene Quan- I have left a message for his scheduler Billie. I have also faxed the request for cardiac clearance as well.

## 2015-01-29 ENCOUNTER — Other Ambulatory Visit: Payer: Self-pay

## 2015-01-29 NOTE — Telephone Encounter (Signed)
-----   Message from Sharee Pimple, RN sent at 01/29/2015  9:28 AM EDT ----- Regarding: RE: schedule Per CEF, if the clearance is less than 4 weeks then no visit, if more than 4 weeks he wants to see him in the office.  ----- Message -----    From: Fredrich Birks    Sent: 01/28/2015  12:15 PM      To: Sharee Pimple, RN Subject: RE: schedule                                   Will Mr Lallo also need to see CEF after cardiac clearance?  Thanks, Annabelle Harman ----- Message -----    From: Sharee Pimple, RN    Sent: 01/24/2015  12:32 PM      To: [facility] Admin Pool Subject: schedule                                         ----- Message -----    From: Sherren Kerns, MD    Sent: 01/24/2015  11:28 AM      To: Vvs Charge Pool  Aortogram with bilat runoff [place] left groin  He needs cardiology eval asap followed by Right femoral endarterectomy right fem pop.  Fabienne Bruns

## 2015-01-29 NOTE — Telephone Encounter (Signed)
Spoke with Kevin Reilly to inform of appt with Dr Allyson Sabal on 02/05/15 @ 1:30 for cardiac clearance.

## 2015-01-30 ENCOUNTER — Telehealth: Payer: Self-pay

## 2015-01-30 NOTE — Telephone Encounter (Signed)
Kevin Reilly, patient's emergency contact, left message on this nurses voicemail yesterday afternoon stating that patient is unable to keep surgery date of 11/7. Called patient to confirm the need for date change. 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 Patient states he would like to move surgery date to the following Tuesday, 11/15. Instructed patient to call our office with any further questions and/or if he decided to move surgery up. Patient verbalized understanding. Left message on Heather Beane's voicemail to return this nurse's call regarding change in date and to give surgical instructions.

## 2015-02-05 ENCOUNTER — Ambulatory Visit (INDEPENDENT_AMBULATORY_CARE_PROVIDER_SITE_OTHER): Payer: Medicare Other | Admitting: Cardiovascular Disease

## 2015-02-05 ENCOUNTER — Other Ambulatory Visit: Payer: Self-pay | Admitting: Cardiovascular Disease

## 2015-02-05 ENCOUNTER — Encounter: Payer: Self-pay | Admitting: Cardiovascular Disease

## 2015-02-05 DIAGNOSIS — Z01818 Encounter for other preprocedural examination: Secondary | ICD-10-CM | POA: Diagnosis not present

## 2015-02-05 DIAGNOSIS — I10 Essential (primary) hypertension: Secondary | ICD-10-CM

## 2015-02-05 DIAGNOSIS — I739 Peripheral vascular disease, unspecified: Secondary | ICD-10-CM

## 2015-02-05 DIAGNOSIS — I779 Disorder of arteries and arterioles, unspecified: Secondary | ICD-10-CM | POA: Insufficient documentation

## 2015-02-05 MED ORDER — AMLODIPINE BESYLATE 5 MG PO TABS: 5.0000 mg | ORAL_TABLET | Freq: Every day | ORAL | Status: DC

## 2015-02-05 NOTE — Assessment & Plan Note (Signed)
Bilateral moderate internal carotid artery stenosis by duplex ultrasound 01/09/15. We will continue to follow this on an annual basis.

## 2015-02-05 NOTE — Progress Notes (Signed)
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 He was referred by Dr. Charlsie Merles for evaluation of critical limb ischemia. I last saw him in the office 01/01/14. He has a history of 30-60 pack years of tobacco abuse having quit 20 years ago. History of hypertension as well. Never had a heart attack or stroke, denies chest pain or shortness of breath. He developed chronic pain in his right foot up until a month ago with erythema. He had instrumentation of the foot now has a nonhealing ulcer on the lateral plantar surface. Lower extremity arterial Doppler studies performed in our office today revealed an occluded right common femoral artery and right SFA. His left ABI is 0.43 with high-grade left common femoral artery stenosis, common iliac artery stenosis and an occluded SFA at the origin. He underwent angiography by Dr. Leonette Most fields 01/24/15 revealing high-grade bilateral common femoral and SFA disease. The intent was to perform right common femoral endarterectomy with poor than the bypass grafting for limb salvage. The patient presents back today for cardiac clearance and risk stratification.   Current Outpatient Prescriptions  Medication Sig Dispense Refill  . acetaminophen (TYLENOL) 500 MG tablet Take 1,500 mg by mouth every 6 (six) hours as needed.    Marland Kitchen amLODipine (NORVASC) 5 MG tablet Take 1 tablet (5 mg total) by mouth daily. 180 tablet 3  . hydrochlorothiazide (MICROZIDE) 12.5 MG capsule Take 12.5 mg by mouth daily.  0  . HYDROcodone-acetaminophen (NORCO) 10-325 MG tablet Take 1 tablet by mouth every 8 (eight) hours as needed. 40 tablet 0  . ramipril (ALTACE) 10 MG capsule Take 10 mg by mouth daily.     No current facility-administered medications for this visit.     Allergies  Allergen Reactions  . Chocolate Other (See Comments)    "welps"  . Sulfur Rash    Social History   Social History  . Marital Status: Married    Spouse Name: N/A  . Number of Children: N/A  . Years of Education: N/A   Occupational History  . Not on file.   Social History Main Topics  . Smoking status: Former Smoker    Types: Cigarettes    Quit date: 01/22/1985  . Smokeless tobacco: Not on file  . Alcohol Use: No  . Drug Use: No  . Sexual Activity: Not on file   Other Topics Concern  . Not on file   Social History Narrative     Review of Systems: General: negative for chills, fever, night sweats or weight changes.  Cardiovascular: negative for chest pain, dyspnea on exertion, edema, orthopnea, palpitations, paroxysmal nocturnal dyspnea or shortness of breath Dermatological: negative for rash Respiratory: negative for cough or wheezing Urologic: negative for hematuria Abdominal: negative for nausea, vomiting, diarrhea, bright red blood per rectum, melena, or hematemesis Neurologic: negative for visual changes, syncope, or dizziness All other systems reviewed and are otherwise negative except as noted above.    Blood pressure 180/62, pulse 50, height 5\' 9"  (1.753 m), weight 184 lb 12.8 oz (83.825 kg).  General appearance: alert and no distress Neck: no adenopathy, no JVD, supple, symmetrical, trachea midline, thyroid not enlarged, symmetric, no tenderness/mass/nodules and bbilateral carotid bruits Lungs: clear to auscultation bilaterally Heart: regular rate and rhythm, S1, S2 normal, no murmur,  click, rub or gallop Extremities: extremities normal, atraumatic, no cyanosis or edema  EKG not performed today  ASSESSMENT AND PLAN:   Essential hypertension History of hypertension with blood pressure measured at 180/62. He is on ramipril. Continue current meds at current dosing  Critical lower limb ischemia History of peripheral arterial disease with  critical limb ischemia. The patient was initially sent to me by Dr. Charlsie Merles for this. Dopplers suggested occluded right common femoral artery and right SFA with high-grade left common femoral artery stenosis and, iliac artery stenosis with an occluded SFA as well. Patient also underwent angiography by Dr. [name] on 01/24/15 revealing high-grade common femoral and SFA disease with the plan to perform right common femoral endarterectomy with them below the knee popliteal bypass grafting. Patient is referred back for cardiac clearance prior to his vascular surgery.  Bilateral carotid artery disease (HCC) Bilateral moderate internal carotid artery stenosis by duplex ultrasound 01/09/15. We will continue to follow this on an annual basis.      Runell Gess MD FACP,FACC,FAHA, [facility] 02/05/2015 1:57 PM

## 2015-02-05 NOTE — Patient Instructions (Signed)
Medication Instructions:  Your physician has recommended you make the following change in your medication:  1) START Norvac 5 mg by mouth daily   Labwork: Your physician recommends that you return for lab work in: FASTING Lipid/Liver The lab can be found on the FIRST FLOOR of out building in [address]   Testing/Procedures: Your physician has requested that you have a lexiscan myoview. For further information please visit [email or web address] Please follow instruction sheet, as given. NEXT WEEK  Your physician has requested that you have a renal artery duplex. During this test, an ultrasound is used to evaluate blood flow to the kidneys. Allow one hour for this exam. Do not eat after midnight the day before and avoid carbonated beverages. Take your medications as you usually do. NEXT WEEK   Follow-Up: Your physician wants you to follow-up in: 6 months with Dr. Allyson Sabal. You will receive a reminder letter in the mail two months in advance. If you don't receive a letter, please call our office to schedule the follow-up appointment.   Any Other Special Instructions Will Be Listed Below (If Applicable).     If you need a refill on your cardiac medications before your next appointment, please call your pharmacy.

## 2015-02-05 NOTE — Assessment & Plan Note (Signed)
History of peripheral arterial disease with critical limb ischemia. The patient was initially sent to me by Dr. Charlsie Merles for this. Dopplers suggested occluded right common femoral artery and right SFA with high-grade left common femoral artery stenosis and, iliac artery stenosis with an occluded SFA as well. Patient also underwent angiography by Dr. [name] on 01/24/15 revealing high-grade common femoral and SFA disease with the plan to perform right common femoral endarterectomy with them below the knee popliteal bypass grafting. Patient is referred back for cardiac clearance prior to his vascular surgery.

## 2015-02-05 NOTE — Assessment & Plan Note (Signed)
History of hypertension with blood pressure measured at 180/62. He is on ramipril. Continue current meds at current dosing

## 2015-02-07 ENCOUNTER — Telehealth: Payer: Self-pay | Admitting: Cardiovascular Disease

## 2015-02-07 NOTE — Telephone Encounter (Signed)
[name] is calling to get cardiac clearance for Mr. Kevin Reilly for an upcoming procedure(Femoral Endarterectomy and right FEMPOP Bypass)  on 02/25/15 .Marland Kitchen  Thanks

## 2015-02-07 NOTE — Telephone Encounter (Signed)
Returned call to [place] with [facility].Dr.Berry out of office this afternoon will send message to him for surgical clearance.Patient scheduled for [facility] 02/20/15.

## 2015-02-08 NOTE — Telephone Encounter (Signed)
Clearance will be based on results of myoview

## 2015-02-10 NOTE — Telephone Encounter (Signed)
Returned call to [place] with [facility].Dr.Berry advised clearance will be based on myoview scheduled 02/20/15.

## 2015-02-11 ENCOUNTER — Other Ambulatory Visit (HOSPITAL_COMMUNITY): Payer: Medicare Other

## 2015-02-11 LAB — HEPATIC FUNCTION PANEL
ALBUMIN: 4.1 g/dL (ref 3.6–5.1)
ALK PHOS: 45 U/L (ref 40–115)
ALT: 11 U/L (ref 9–46)
AST: 13 U/L (ref 10–35)
Bilirubin, Direct: 0.1 mg/dL (ref <=0.2)
Indirect Bilirubin: 0.5 mg/dL (ref 0.2–1.2)
TOTAL PROTEIN: 7.2 g/dL (ref 6.1–8.1)
Total Bilirubin: 0.6 mg/dL (ref 0.2–1.2)

## 2015-02-11 LAB — LIPID PANEL
Cholesterol: 174 mg/dL (ref 125–200)
HDL: 42 mg/dL (ref >=40)
LDL Cholesterol: 105 mg/dL (ref <130)
Total CHOL/HDL Ratio: 4.1 Ratio (ref <=5.0)
Triglycerides: 137 mg/dL (ref <150)
VLDL: 27 mg/dL (ref <30)

## 2015-02-13 ENCOUNTER — Telehealth: Payer: Self-pay

## 2015-02-13 MED ORDER — ATORVASTATIN CALCIUM 20 MG PO TABS: 20.0000 mg | ORAL_TABLET | Freq: Every day | ORAL | Status: DC

## 2015-02-13 MED ORDER — CO Q10 100 MG PO CAPS: 200.0000 mg | ORAL_CAPSULE | Freq: Every day | ORAL | Status: DC

## 2015-02-13 NOTE — Telephone Encounter (Signed)
-----   Message from Runell Gess, MD sent at 02/11/2015  8:10 AM EDT ----- Not at goal for secondary prevention. We'll start atorvastatin 20 mg a day and recheck in 2 months. Also begin on Co Q 10 200 mg a day

## 2015-02-13 NOTE — Telephone Encounter (Signed)
Pt made aware of results no questions at this time.   

## 2015-02-17 ENCOUNTER — Ambulatory Visit (HOSPITAL_COMMUNITY)
Admission: RE | Admit: 2015-02-17 | Discharge: 2015-02-17 | Disposition: A | Discharge status: Home / Self Care | Payer: Medicare Other | Source: Ambulatory Visit | Attending: Cardiovascular Disease | Admitting: Cardiovascular Disease

## 2015-02-17 DIAGNOSIS — I10 Essential (primary) hypertension: Secondary | ICD-10-CM

## 2015-02-18 ENCOUNTER — Telehealth (HOSPITAL_COMMUNITY): Payer: Self-pay

## 2015-02-18 NOTE — Telephone Encounter (Signed)
Encounter complete. 

## 2015-02-20 ENCOUNTER — Inpatient Hospital Stay (HOSPITAL_COMMUNITY): Admission: RE | Admit: 2015-02-20 | Payer: Medicare Other | Source: Ambulatory Visit

## 2015-02-20 ENCOUNTER — Ambulatory Visit (HOSPITAL_COMMUNITY)
Admission: RE | Admit: 2015-02-20 | Discharge: 2015-02-20 | Disposition: A | Discharge status: Home / Self Care | Payer: Medicare Other | Source: Ambulatory Visit | Attending: Cardiology | Admitting: Cardiology

## 2015-02-20 DIAGNOSIS — Z79899 Other long term (current) drug therapy: Secondary | ICD-10-CM

## 2015-02-20 DIAGNOSIS — Z01818 Encounter for other preprocedural examination: Secondary | ICD-10-CM | POA: Diagnosis not present

## 2015-02-20 DIAGNOSIS — Z87891 Personal history of nicotine dependence: Secondary | ICD-10-CM | POA: Insufficient documentation

## 2015-02-20 DIAGNOSIS — I739 Peripheral vascular disease, unspecified: Secondary | ICD-10-CM | POA: Insufficient documentation

## 2015-02-20 DIAGNOSIS — I517 Cardiomegaly: Secondary | ICD-10-CM | POA: Insufficient documentation

## 2015-02-20 DIAGNOSIS — I779 Disorder of arteries and arterioles, unspecified: Secondary | ICD-10-CM | POA: Insufficient documentation

## 2015-02-20 DIAGNOSIS — I10 Essential (primary) hypertension: Secondary | ICD-10-CM | POA: Insufficient documentation

## 2015-02-20 MED ORDER — TECHNETIUM TC 99M SESTAMIBI GENERIC - CARDIOLITE
31.9000 | Freq: Once | INTRAVENOUS | Status: AC | PRN
  Administered 2015-02-20: 31.9 via INTRAVENOUS

## 2015-02-20 MED ORDER — TECHNETIUM TC 99M SESTAMIBI GENERIC - CARDIOLITE
10.4000 | Freq: Once | INTRAVENOUS | Status: AC | PRN
  Administered 2015-02-20: 10.4 via INTRAVENOUS

## 2015-02-20 MED ORDER — REGADENOSON 0.4 MG/5ML IV SOLN
0.4000 mg | Freq: Once | INTRAVENOUS | Status: AC
  Administered 2015-02-20: 0.4 mg via INTRAVENOUS

## 2015-02-21 ENCOUNTER — Encounter (HOSPITAL_COMMUNITY)
Admission: RE | Admit: 2015-02-21 | Discharge: 2015-02-21 | Disposition: A | Discharge status: Home / Self Care | Payer: Medicare Other | Source: Ambulatory Visit | Attending: Vascular Surgery | Admitting: Vascular Surgery

## 2015-02-21 ENCOUNTER — Encounter (HOSPITAL_COMMUNITY): Payer: Self-pay

## 2015-02-21 ENCOUNTER — Telehealth: Payer: Self-pay | Admitting: Cardiovascular Disease

## 2015-02-21 DIAGNOSIS — Z0183 Encounter for blood typing: Secondary | ICD-10-CM | POA: Insufficient documentation

## 2015-02-21 DIAGNOSIS — E785 Hyperlipidemia, unspecified: Secondary | ICD-10-CM | POA: Insufficient documentation

## 2015-02-21 DIAGNOSIS — Z01818 Encounter for other preprocedural examination: Secondary | ICD-10-CM | POA: Diagnosis not present

## 2015-02-21 DIAGNOSIS — I739 Peripheral vascular disease, unspecified: Secondary | ICD-10-CM | POA: Diagnosis not present

## 2015-02-21 DIAGNOSIS — Z87891 Personal history of nicotine dependence: Secondary | ICD-10-CM | POA: Insufficient documentation

## 2015-02-21 DIAGNOSIS — I10 Essential (primary) hypertension: Secondary | ICD-10-CM | POA: Insufficient documentation

## 2015-02-21 DIAGNOSIS — Z01812 Encounter for preprocedural laboratory examination: Secondary | ICD-10-CM | POA: Insufficient documentation

## 2015-02-21 HISTORY — DX: Nocturia: R35.1

## 2015-02-21 HISTORY — DX: Hyperlipidemia, unspecified: E78.5

## 2015-02-21 HISTORY — DX: Effusion, unspecified joint: M25.40

## 2015-02-21 LAB — COMPREHENSIVE METABOLIC PANEL
ALBUMIN: 4 g/dL (ref 3.5–5.0)
ALT: 14 U/L — AB (ref 17–63)
AST: 17 U/L (ref 15–41)
Alkaline Phosphatase: 41 U/L (ref 38–126)
Anion gap: 7 (ref 5–15)
BUN: 34 mg/dL — ABNORMAL HIGH (ref 6–20)
CALCIUM: 9.5 mg/dL (ref 8.9–10.3)
CHLORIDE: 110 mmol/L (ref 101–111)
CO2: 24 mmol/L (ref 22–32)
CREATININE: 1.34 mg/dL — AB (ref 0.61–1.24)
GFR calc Af Amer: 59 mL/min — ABNORMAL LOW (ref >60)
GFR calc non Af Amer: 51 mL/min — ABNORMAL LOW (ref >60)
GLUCOSE: 86 mg/dL (ref 65–99)
Potassium: 4.9 mmol/L (ref 3.5–5.1)
SODIUM: 141 mmol/L (ref 135–145)
Total Bilirubin: 0.7 mg/dL (ref 0.3–1.2)
Total Protein: 7.2 g/dL (ref 6.5–8.1)

## 2015-02-21 LAB — URINALYSIS, ROUTINE W REFLEX MICROSCOPIC
BILIRUBIN URINE: NEGATIVE
GLUCOSE, UA: NEGATIVE mg/dL
Hgb urine dipstick: NEGATIVE
KETONES UR: NEGATIVE mg/dL
NITRITE: NEGATIVE
PH: 5 (ref 5.0–8.0)
PROTEIN: NEGATIVE mg/dL
Specific Gravity, Urine: 1.021 (ref 1.005–1.030)
Urobilinogen, UA: 0.2 mg/dL (ref 0.0–1.0)

## 2015-02-21 LAB — MYOCARDIAL PERFUSION IMAGING
CSEPPHR: 80 {beats}/min
LVDIAVOL: 183 mL
LVSYSVOL: 93 mL
NUC STRESS TID: 1.06
Rest HR: 50 {beats}/min
SDS: 0
SRS: 1
SSS: 1

## 2015-02-21 LAB — URINE MICROSCOPIC-ADD ON

## 2015-02-21 LAB — TYPE AND SCREEN
ABO/RH(D): A POS
Antibody Screen: NEGATIVE

## 2015-02-21 LAB — CBC
HCT: 34.8 % — ABNORMAL LOW (ref 39.0–52.0)
Hemoglobin: 11.3 g/dL — ABNORMAL LOW (ref 13.0–17.0)
MCH: 31.8 pg (ref 26.0–34.0)
MCHC: 32.5 g/dL (ref 30.0–36.0)
MCV: 98 fL (ref 78.0–100.0)
PLATELETS: 208 10*3/uL (ref 150–400)
RBC: 3.55 MIL/uL — AB (ref 4.22–5.81)
RDW: 13.7 % (ref 11.5–15.5)
WBC: 6.6 10*3/uL (ref 4.0–10.5)

## 2015-02-21 LAB — ABO/RH: ABO/RH(D): A POS

## 2015-02-21 LAB — PROTIME-INR
INR: 1.23 (ref 0.00–1.49)
Prothrombin Time: 15.7 seconds — ABNORMAL HIGH (ref 11.6–15.2)

## 2015-02-21 LAB — SURGICAL PCR SCREEN
MRSA, PCR: NEGATIVE
STAPHYLOCOCCUS AUREUS: NEGATIVE

## 2015-02-21 LAB — APTT: APTT: 38 s — AB (ref 24–37)

## 2015-02-21 MED ORDER — CHLORHEXIDINE GLUCONATE CLOTH 2 % EX PADS: 6.0000 | MEDICATED_PAD | Freq: Once | CUTANEOUS | Status: DC

## 2015-02-21 NOTE — Progress Notes (Addendum)
Cardiologist is Dr.Berry with last visit in epic from 02/05/15  Medical MD denies having one  Stress test done 02/20/2015  Heart cath denies ever having one  Echo denies ever having one  EKG in epic from 01-24-15  Denies having a CXR in past yr

## 2015-02-21 NOTE — Telephone Encounter (Signed)
Pt need to know results of his stress test asap. If he can not have surgery,he needs to know this today before he goes to Pre-Admission at 2:00 please.

## 2015-02-21 NOTE — Telephone Encounter (Signed)
Will forward to Dr Hazle Coca nurse and Dr Allyson Sabal as this is time sensative

## 2015-02-21 NOTE — Telephone Encounter (Signed)
Cleared for surgery at low CV risk

## 2015-02-21 NOTE — Pre-Procedure Instructions (Signed)
Kevin Reilly  02/21/2015      [id number] [address] - [place], [place] - [address] [place] [address] Phone: [phone] Fax: [phone]  [id number] - [place], [place] - [address] [place] [address] Phone: [phone] Fax: [phone]    Your procedure is scheduled on Tues, Oct 15 @ 7:30 AM  Report to [facility] Admitting at 5:30 AM  Call this number if you have problems the morning of surgery:  [phone]   Remember:  Do not eat food or drink liquids after midnight.  Take these medicines the morning of surgery with A SIP OF WATER Amlodipine(Norvasc) and Pain Pill(if needed)               Stop taking your CO Q10. No Goody's,BC's,Aleve,Aspirin,Ibuprofen,Motrin,Advil,Fish Oil,or any Herbal Medications.    Do not wear jewelry.  Do not wear lotions, powders, or colognes.  You may wear deodorant.  Men may shave face and neck.  Do not bring valuables to the hospital.  [facility] is not responsible for any belongings or valuables.  Contacts, dentures or bridgework may not be worn into surgery.  Leave your suitcase in the car.  After surgery it may be brought to your room.  For patients admitted to the hospital, discharge time will be determined by your treatment team.  Patients discharged the day of surgery will not be allowed to drive home.    Special instructions:  [place] - Preparing for Surgery  Before surgery, you can play an important role.  Because skin is not sterile, your skin needs to be as free of germs as possible.  You can reduce the number of germs on you skin by washing with CHG (chlorahexidine gluconate) soap before surgery.  CHG is an antiseptic cleaner which kills germs and bonds with the skin to continue killing germs even after washing.  Please DO NOT use if you have an allergy to CHG or antibacterial soaps.  If your skin  becomes reddened/irritated stop using the CHG and inform your nurse when you arrive at Short Stay.  Do not shave (including legs and underarms) for at least 48 hours prior to the first CHG shower.  You may shave your face.  Please follow these instructions carefully:   1.  Shower with CHG Soap the night before surgery and the                                morning of Surgery.  2.  If you choose to wash your hair, wash your hair first as usual with your       normal shampoo.  3.  After you shampoo, rinse your hair and body thoroughly to remove the                      Shampoo.  4.  Use CHG as you would any other liquid soap.  You can apply chg directly       to the skin and wash gently with scrungie or a clean washcloth.  5.  Apply the CHG Soap to your body ONLY FROM THE NECK DOWN.        Do not use on open wounds or open sores.  Avoid contact with your eyes,       ears, mouth and genitals (private parts).  Wash genitals (private  parts)       with your normal soap.  6.  Wash thoroughly, paying special attention to the area where your surgery        will be performed.  7.  Thoroughly rinse your body with warm water from the neck down.  8.  DO NOT shower/wash with your normal soap after using and rinsing off       the CHG Soap.  9.  Pat yourself dry with a clean towel.            10.  Wear clean pajamas.            11.  Place clean sheets on your bed the night of your first shower and do not        sleep with pets.  Day of Surgery  Do not apply any lotions/deoderants the morning of surgery.  Please wear clean clothes to the hospital/surgery center.    Please read over the following fact sheets that you were given. Pain Booklet, Coughing and Deep Breathing, Blood Transfusion Information, MRSA Information and Surgical Site Infection Prevention

## 2015-02-21 NOTE — Telephone Encounter (Signed)
Spoke to [facility] Informed her, that  Patient's test was reviewed - low risk study-still awaiting Dr Allyson Sabal to sign off on test. But patient can go have pre-admission Work up done  SHE verbalized understanding

## 2015-02-21 NOTE — Telephone Encounter (Signed)
Pt is calling back to see if he is cleared for surgery that is scheduled on 11/15. He is scheduled to have pre admissions today at 2:00pm  Thanks

## 2015-02-24 MED ORDER — DEXTROSE 5 % IV SOLN
1.5000 g | INTRAVENOUS | Status: AC
  Administered 2015-02-25: 1.5 g via INTRAVENOUS
  Filled 2015-02-24: qty 1.5

## 2015-02-24 NOTE — Progress Notes (Signed)
Anesthesia Chart Review: Patient is a 74 year old male scheduled for right femoral endarterectomy, right femoral to popliteal artery bypass on 11/151/6 by Dr. [name].  History includes former smoker, PAD, HTN, HLD, carotid occlusive disease, back surgery.  No PCP.   Cardiologist is Dr. Allyson Sabal who cleared patient for surgery at "low CV risk."  01/24/15 EKG: Marked SB at 42 bpm, non-specific IVCD.  02/20/15 Nuclear stress test:  The left ventricular ejection fraction is mildly decreased (45-54%).  Nuclear stress EF: 49%.  There was no ST segment deviation noted during stress.  No T wave inversion was noted during stress.  The study is normal.  This is a low risk study. Low risk stress nuclear study with normal perfusion and mildly depressed left ventricular global systolic function. Findings suggest nonischemic cardiomyopathy. (Reviewed by Dr. Allyson Sabal, "Non ischemic/low risk MV. Cleared for Vasc surgery at low CV risk.")  01/24/15 Carotid duplex: Summary: - Technically diffiuclt due to tortuosity and turbulent flow. - Bilateral - 40% to 59% ICA stenosis upper end of scale. Acoustic  shadowing may obscure higher velocities. Turbulent flow noted.  ECA stenosis. vertebral artey flow is antegrade.  Preoperative labs noted.   If no acute changes then I anticipate that he can proceed as planned.  Velna Ochs [facility] Short Stay Center/Anesthesiology Phone [phone] 02/24/2015 9:46 AM

## 2015-02-25 ENCOUNTER — Encounter (HOSPITAL_COMMUNITY): Payer: Self-pay | Admitting: Certified Registered Nurse Anesthetist

## 2015-02-25 ENCOUNTER — Encounter (HOSPITAL_COMMUNITY)
Admission: RE | Disposition: A | Discharge status: Home Health | Payer: Self-pay | Source: Ambulatory Visit | Attending: Vascular Surgery

## 2015-02-25 ENCOUNTER — Inpatient Hospital Stay (HOSPITAL_COMMUNITY): Payer: Medicare Other

## 2015-02-25 ENCOUNTER — Inpatient Hospital Stay (HOSPITAL_COMMUNITY)
Admission: RE | Admit: 2015-02-25 | Discharge: 2015-02-27 | DRG: 253 | Disposition: A | Discharge status: Home Health | Payer: Medicare Other | Source: Ambulatory Visit | Attending: Vascular Surgery | Admitting: Vascular Surgery

## 2015-02-25 ENCOUNTER — Inpatient Hospital Stay (HOSPITAL_COMMUNITY): Payer: Medicare Other | Admitting: Vascular Surgery

## 2015-02-25 ENCOUNTER — Inpatient Hospital Stay (HOSPITAL_COMMUNITY): Payer: Medicare Other | Admitting: Anesthesiology

## 2015-02-25 DIAGNOSIS — I10 Essential (primary) hypertension: Secondary | ICD-10-CM | POA: Diagnosis present

## 2015-02-25 DIAGNOSIS — M869 Osteomyelitis, unspecified: Secondary | ICD-10-CM | POA: Diagnosis present

## 2015-02-25 DIAGNOSIS — R001 Bradycardia, unspecified: Secondary | ICD-10-CM | POA: Diagnosis not present

## 2015-02-25 DIAGNOSIS — L97429 Non-pressure chronic ulcer of left heel and midfoot with unspecified severity: Secondary | ICD-10-CM | POA: Diagnosis present

## 2015-02-25 DIAGNOSIS — Z95828 Presence of other vascular implants and grafts: Secondary | ICD-10-CM

## 2015-02-25 DIAGNOSIS — Z87891 Personal history of nicotine dependence: Secondary | ICD-10-CM

## 2015-02-25 DIAGNOSIS — L97519 Non-pressure chronic ulcer of other part of right foot with unspecified severity: Secondary | ICD-10-CM | POA: Diagnosis present

## 2015-02-25 DIAGNOSIS — Z419 Encounter for procedure for purposes other than remedying health state, unspecified: Secondary | ICD-10-CM

## 2015-02-25 DIAGNOSIS — I70244 Atherosclerosis of native arteries of left leg with ulceration of heel and midfoot: Secondary | ICD-10-CM | POA: Diagnosis present

## 2015-02-25 DIAGNOSIS — I739 Peripheral vascular disease, unspecified: Secondary | ICD-10-CM | POA: Diagnosis present

## 2015-02-25 DIAGNOSIS — M86571 Other chronic hematogenous osteomyelitis, right ankle and foot: Secondary | ICD-10-CM

## 2015-02-25 HISTORY — PX: ENDARTERECTOMY FEMORAL: SHX5804

## 2015-02-25 HISTORY — PX: FEMORAL-POPLITEAL BYPASS GRAFT: SHX937

## 2015-02-25 LAB — COMPREHENSIVE METABOLIC PANEL
ALBUMIN: 3.4 g/dL — AB (ref 3.5–5.0)
ALT: 13 U/L — ABNORMAL LOW (ref 17–63)
ANION GAP: 6 (ref 5–15)
AST: 19 U/L (ref 15–41)
Alkaline Phosphatase: 37 U/L — ABNORMAL LOW (ref 38–126)
BUN: 27 mg/dL — ABNORMAL HIGH (ref 6–20)
CHLORIDE: 112 mmol/L — AB (ref 101–111)
CO2: 22 mmol/L (ref 22–32)
Calcium: 9.1 mg/dL (ref 8.9–10.3)
Creatinine, Ser: 1 mg/dL (ref 0.61–1.24)
GFR calc non Af Amer: >60 mL/min (ref >60)
GLUCOSE: 107 mg/dL — AB (ref 65–99)
POTASSIUM: 4.5 mmol/L (ref 3.5–5.1)
SODIUM: 140 mmol/L (ref 135–145)
Total Bilirubin: 0.6 mg/dL (ref 0.3–1.2)
Total Protein: 6.2 g/dL — ABNORMAL LOW (ref 6.5–8.1)

## 2015-02-25 LAB — CBC
HEMATOCRIT: 31.1 % — AB (ref 39.0–52.0)
HEMOGLOBIN: 10 g/dL — AB (ref 13.0–17.0)
MCH: 31.3 pg (ref 26.0–34.0)
MCHC: 32.2 g/dL (ref 30.0–36.0)
MCV: 97.2 fL (ref 78.0–100.0)
Platelets: 191 10*3/uL (ref 150–400)
RBC: 3.2 MIL/uL — AB (ref 4.22–5.81)
RDW: 13.5 % (ref 11.5–15.5)
WBC: 10.5 10*3/uL (ref 4.0–10.5)

## 2015-02-25 LAB — TROPONIN I

## 2015-02-25 LAB — CK TOTAL AND CKMB (NOT AT ARMC)
CK TOTAL: 124 U/L (ref 49–397)
CK, MB: 2.7 ng/mL (ref 0.5–5.0)
Relative Index: 2.2 (ref 0.0–2.5)

## 2015-02-25 SURGERY — ENDARTERECTOMY, FEMORAL
Anesthesia: General | Site: Leg Upper | Laterality: Right

## 2015-02-25 MED ORDER — ALUM & MAG HYDROXIDE-SIMETH 200-200-20 MG/5ML PO SUSP: 15.0000 mL | ORAL | Status: DC | PRN

## 2015-02-25 MED ORDER — HYDRALAZINE HCL 20 MG/ML IJ SOLN
INTRAMUSCULAR | Status: AC
  Administered 2015-02-25: 5 mg
  Filled 2015-02-25: qty 1

## 2015-02-25 MED ORDER — DOCUSATE SODIUM 100 MG PO CAPS
100.0000 mg | ORAL_CAPSULE | Freq: Every day | ORAL | Status: DC
  Administered 2015-02-26 – 2015-02-27 (×2): 100 mg via ORAL
  Filled 2015-02-25 (×2): qty 1

## 2015-02-25 MED ORDER — GUAIFENESIN-DM 100-10 MG/5ML PO SYRP: 15.0000 mL | ORAL_SOLUTION | ORAL | Status: DC | PRN

## 2015-02-25 MED ORDER — OXYCODONE HCL 5 MG/5ML PO SOLN: 5.0000 mg | Freq: Once | ORAL | Status: DC | PRN

## 2015-02-25 MED ORDER — LIDOCAINE HCL (CARDIAC) 20 MG/ML IV SOLN
INTRAVENOUS | Status: DC | PRN
  Administered 2015-02-25: 40 mg via INTRAVENOUS

## 2015-02-25 MED ORDER — HEPARIN SODIUM (PORCINE) 1000 UNIT/ML IJ SOLN
INTRAMUSCULAR | Status: DC | PRN
  Administered 2015-02-25: 8000 [IU] via INTRAVENOUS
  Administered 2015-02-25: 5000 [IU] via INTRAVENOUS

## 2015-02-25 MED ORDER — GLYCOPYRROLATE 0.2 MG/ML IJ SOLN
INTRAMUSCULAR | Status: DC | PRN
  Administered 2015-02-25: 0.4 mg via INTRAVENOUS
  Administered 2015-02-25: .8 mg via INTRAVENOUS

## 2015-02-25 MED ORDER — FENTANYL CITRATE (PF) 250 MCG/5ML IJ SOLN
INTRAMUSCULAR | Status: AC
Start: 2015-02-25 — End: 2015-02-25
  Filled 2015-02-25: qty 5

## 2015-02-25 MED ORDER — MIDAZOLAM HCL 5 MG/5ML IJ SOLN
INTRAMUSCULAR | Status: DC | PRN
  Administered 2015-02-25: 1 mg via INTRAVENOUS

## 2015-02-25 MED ORDER — FENTANYL CITRATE (PF) 100 MCG/2ML IJ SOLN
INTRAMUSCULAR | Status: DC | PRN
  Administered 2015-02-25: 25 ug via INTRAVENOUS
  Administered 2015-02-25 (×2): 50 ug via INTRAVENOUS
  Administered 2015-02-25: 25 ug via INTRAVENOUS
  Administered 2015-02-25 (×3): 50 ug via INTRAVENOUS

## 2015-02-25 MED ORDER — HEPARIN SODIUM (PORCINE) 1000 UNIT/ML IJ SOLN
INTRAMUSCULAR | Status: AC
Start: 2015-02-25 — End: 2015-02-25
  Filled 2015-02-25: qty 1

## 2015-02-25 MED ORDER — POTASSIUM CHLORIDE CRYS ER 20 MEQ PO TBCR: 20.0000 meq | EXTENDED_RELEASE_TABLET | Freq: Every day | ORAL | Status: DC | PRN

## 2015-02-25 MED ORDER — ATORVASTATIN CALCIUM 20 MG PO TABS
20.0000 mg | ORAL_TABLET | Freq: Every day | ORAL | Status: DC
  Administered 2015-02-25 – 2015-02-27 (×3): 20 mg via ORAL
  Filled 2015-02-25 (×4): qty 1

## 2015-02-25 MED ORDER — MIDAZOLAM HCL 2 MG/2ML IJ SOLN
INTRAMUSCULAR | Status: AC
  Filled 2015-02-25: qty 4

## 2015-02-25 MED ORDER — FENTANYL CITRATE (PF) 250 MCG/5ML IJ SOLN
INTRAMUSCULAR | Status: AC
  Filled 2015-02-25: qty 5

## 2015-02-25 MED ORDER — HEPARIN SODIUM (PORCINE) 5000 UNIT/ML IJ SOLN
5000.0000 [IU] | Freq: Three times a day (TID) | INTRAMUSCULAR | Status: DC
  Administered 2015-02-26 – 2015-02-27 (×2): 5000 [IU] via SUBCUTANEOUS
  Filled 2015-02-25 (×6): qty 1

## 2015-02-25 MED ORDER — ATROPINE SULFATE 0.4 MG/ML IJ SOLN
INTRAMUSCULAR | Status: DC | PRN
  Administered 2015-02-25 (×2): 0.4 mg via INTRAVENOUS

## 2015-02-25 MED ORDER — EPHEDRINE SULFATE 50 MG/ML IJ SOLN
INTRAMUSCULAR | Status: DC | PRN
  Administered 2015-02-25 (×2): 5 mg via INTRAVENOUS
  Administered 2015-02-25: 10 mg via INTRAVENOUS
  Administered 2015-02-25: 5 mg via INTRAVENOUS

## 2015-02-25 MED ORDER — PROPOFOL 10 MG/ML IV BOLUS
INTRAVENOUS | Status: DC | PRN
  Administered 2015-02-25: 160 mg via INTRAVENOUS

## 2015-02-25 MED ORDER — HEPARIN SODIUM (PORCINE) 1000 UNIT/ML IJ SOLN
INTRAMUSCULAR | Status: AC
  Filled 2015-02-25: qty 1

## 2015-02-25 MED ORDER — ACETAMINOPHEN 325 MG PO TABS: 325.0000 mg | ORAL_TABLET | ORAL | Status: DC | PRN

## 2015-02-25 MED ORDER — OXYCODONE HCL 5 MG PO TABS: 5.0000 mg | ORAL_TABLET | Freq: Once | ORAL | Status: DC | PRN

## 2015-02-25 MED ORDER — DEXTROSE 5 % IV SOLN
1.5000 g | Freq: Two times a day (BID) | INTRAVENOUS | Status: AC
  Administered 2015-02-25 – 2015-02-26 (×2): 1.5 g via INTRAVENOUS
  Filled 2015-02-25 (×2): qty 1.5

## 2015-02-25 MED ORDER — FENTANYL CITRATE (PF) 100 MCG/2ML IJ SOLN
INTRAMUSCULAR | Status: AC
Start: 2015-02-25 — End: 2015-02-26
  Filled 2015-02-25: qty 2

## 2015-02-25 MED ORDER — FENTANYL CITRATE (PF) 100 MCG/2ML IJ SOLN
25.0000 ug | INTRAMUSCULAR | Status: DC | PRN
  Administered 2015-02-25: 25 ug via INTRAVENOUS
  Administered 2015-02-25: 50 ug via INTRAVENOUS

## 2015-02-25 MED ORDER — NEOSTIGMINE METHYLSULFATE 10 MG/10ML IV SOLN
INTRAVENOUS | Status: AC
  Filled 2015-02-25: qty 1

## 2015-02-25 MED ORDER — IOHEXOL 300 MG/ML  SOLN
INTRAMUSCULAR | Status: DC | PRN
  Administered 2015-02-25: 26 mL via INTRAVENOUS

## 2015-02-25 MED ORDER — ONDANSETRON HCL 4 MG/2ML IJ SOLN: 4.0000 mg | Freq: Four times a day (QID) | INTRAMUSCULAR | Status: DC | PRN

## 2015-02-25 MED ORDER — HYDRALAZINE HCL 20 MG/ML IJ SOLN
5.0000 mg | INTRAMUSCULAR | Status: AC | PRN
  Administered 2015-02-25 (×2): 5 mg via INTRAVENOUS

## 2015-02-25 MED ORDER — LACTATED RINGERS IV SOLN
INTRAVENOUS | Status: DC | PRN
  Administered 2015-02-25 (×3): via INTRAVENOUS

## 2015-02-25 MED ORDER — NEOSTIGMINE METHYLSULFATE 10 MG/10ML IV SOLN
INTRAVENOUS | Status: DC | PRN
Start: 2015-02-25 — End: 2015-02-25
  Administered 2015-02-25: 4 mg via INTRAVENOUS

## 2015-02-25 MED ORDER — MORPHINE SULFATE (PF) 2 MG/ML IV SOLN
2.0000 mg | INTRAVENOUS | Status: DC | PRN
  Administered 2015-02-25 – 2015-02-26 (×3): 2 mg via INTRAVENOUS
  Filled 2015-02-25 (×3): qty 1

## 2015-02-25 MED ORDER — EPHEDRINE SULFATE 50 MG/ML IJ SOLN
INTRAMUSCULAR | Status: AC
  Filled 2015-02-25: qty 1

## 2015-02-25 MED ORDER — LABETALOL HCL 5 MG/ML IV SOLN
INTRAVENOUS | Status: AC
  Filled 2015-02-25: qty 4

## 2015-02-25 MED ORDER — THROMBIN 20000 UNITS EX SOLR
CUTANEOUS | Status: AC
  Filled 2015-02-25: qty 20000

## 2015-02-25 MED ORDER — ROCURONIUM BROMIDE 100 MG/10ML IV SOLN
INTRAVENOUS | Status: DC | PRN
  Administered 2015-02-25: 50 mg via INTRAVENOUS
  Administered 2015-02-25: 20 mg via INTRAVENOUS

## 2015-02-25 MED ORDER — PANTOPRAZOLE SODIUM 40 MG PO TBEC
40.0000 mg | DELAYED_RELEASE_TABLET | Freq: Every day | ORAL | Status: DC
  Administered 2015-02-25 – 2015-02-27 (×3): 40 mg via ORAL
  Filled 2015-02-25 (×3): qty 1

## 2015-02-25 MED ORDER — ROCURONIUM BROMIDE 50 MG/5ML IV SOLN
INTRAVENOUS | Status: AC
  Filled 2015-02-25: qty 1

## 2015-02-25 MED ORDER — HYDROCODONE-ACETAMINOPHEN 10-325 MG PO TABS
1.0000 | ORAL_TABLET | Freq: Four times a day (QID) | ORAL | Status: DC | PRN
  Administered 2015-02-26 (×2): 2 via ORAL
  Administered 2015-02-26 – 2015-02-27 (×3): 1 via ORAL
  Filled 2015-02-25: qty 2
  Filled 2015-02-25: qty 1
  Filled 2015-02-25: qty 2
  Filled 2015-02-25 (×2): qty 1

## 2015-02-25 MED ORDER — ACETAMINOPHEN 650 MG RE SUPP: 325.0000 mg | RECTAL | Status: DC | PRN

## 2015-02-25 MED ORDER — ONDANSETRON HCL 4 MG/2ML IJ SOLN: 4.0000 mg | Freq: Once | INTRAMUSCULAR | Status: DC | PRN

## 2015-02-25 MED ORDER — BISACODYL 10 MG RE SUPP
10.0000 mg | Freq: Every day | RECTAL | Status: DC | PRN
Start: 2015-02-25 — End: 2015-02-27

## 2015-02-25 MED ORDER — METOPROLOL TARTRATE 1 MG/ML IV SOLN
2.0000 mg | INTRAVENOUS | Status: DC | PRN
Start: 2015-02-25 — End: 2015-02-27

## 2015-02-25 MED ORDER — ONDANSETRON HCL 4 MG/2ML IJ SOLN
INTRAMUSCULAR | Status: AC
  Filled 2015-02-25: qty 2

## 2015-02-25 MED ORDER — AMLODIPINE BESYLATE 5 MG PO TABS
5.0000 mg | ORAL_TABLET | Freq: Every day | ORAL | Status: DC
  Administered 2015-02-25 – 2015-02-26 (×2): 5 mg via ORAL
  Filled 2015-02-25 (×3): qty 1

## 2015-02-25 MED ORDER — GLYCOPYRROLATE 0.2 MG/ML IJ SOLN
INTRAMUSCULAR | Status: AC
  Filled 2015-02-25: qty 3

## 2015-02-25 MED ORDER — GLYCOPYRROLATE 0.2 MG/ML IJ SOLN
INTRAMUSCULAR | Status: AC
  Filled 2015-02-25: qty 2

## 2015-02-25 MED ORDER — SODIUM CHLORIDE 0.9 % IJ SOLN
INTRAMUSCULAR | Status: AC
  Filled 2015-02-25: qty 10

## 2015-02-25 MED ORDER — LABETALOL HCL 5 MG/ML IV SOLN
10.0000 mg | INTRAVENOUS | Status: DC | PRN
  Administered 2015-02-25 (×3): 10 mg via INTRAVENOUS

## 2015-02-25 MED ORDER — LIDOCAINE HCL (CARDIAC) 20 MG/ML IV SOLN
INTRAVENOUS | Status: AC
  Filled 2015-02-25: qty 5

## 2015-02-25 MED ORDER — HYDROCHLOROTHIAZIDE 12.5 MG PO CAPS
12.5000 mg | ORAL_CAPSULE | Freq: Every day | ORAL | Status: DC
  Administered 2015-02-25 – 2015-02-27 (×3): 12.5 mg via ORAL
  Filled 2015-02-25 (×4): qty 1

## 2015-02-25 MED ORDER — SODIUM CHLORIDE 0.9 % IV SOLN: 500.0000 mL | Freq: Once | INTRAVENOUS | Status: DC | PRN

## 2015-02-25 MED ORDER — PROPOFOL 10 MG/ML IV BOLUS
INTRAVENOUS | Status: AC
  Filled 2015-02-25: qty 20

## 2015-02-25 MED ORDER — ARTIFICIAL TEARS OP OINT
TOPICAL_OINTMENT | OPHTHALMIC | Status: DC | PRN
  Administered 2015-02-25: 1 via OPHTHALMIC

## 2015-02-25 MED ORDER — SODIUM CHLORIDE 0.9 % IV SOLN
INTRAVENOUS | Status: DC
  Administered 2015-02-25: 18:00:00 via INTRAVENOUS

## 2015-02-25 MED ORDER — SENNOSIDES-DOCUSATE SODIUM 8.6-50 MG PO TABS: 1.0000 | ORAL_TABLET | Freq: Every evening | ORAL | Status: DC | PRN

## 2015-02-25 MED ORDER — ONDANSETRON HCL 4 MG/2ML IJ SOLN
INTRAMUSCULAR | Status: DC | PRN
  Administered 2015-02-25: 4 mg via INTRAVENOUS

## 2015-02-25 MED ORDER — PHENOL 1.4 % MT LIQD
1.0000 | OROMUCOSAL | Status: DC | PRN
  Administered 2015-02-25: 1 via OROMUCOSAL
  Filled 2015-02-25: qty 177

## 2015-02-25 MED ORDER — SODIUM CHLORIDE 0.9 % IV SOLN: INTRAVENOUS | Status: DC

## 2015-02-25 MED ORDER — PAPAVERINE HCL 30 MG/ML IJ SOLN
INTRAMUSCULAR | Status: AC
  Filled 2015-02-25: qty 2

## 2015-02-25 MED ORDER — ARTIFICIAL TEARS OP OINT
TOPICAL_OINTMENT | OPHTHALMIC | Status: AC
  Filled 2015-02-25: qty 3.5

## 2015-02-25 MED ORDER — SODIUM CHLORIDE 0.9 % IV SOLN
INTRAVENOUS | Status: DC | PRN
  Administered 2015-02-25: 500 mL

## 2015-02-25 MED ORDER — 0.9 % SODIUM CHLORIDE (POUR BTL) OPTIME
TOPICAL | Status: DC | PRN
  Administered 2015-02-25: 1000 mL

## 2015-02-25 SURGICAL SUPPLY — 62 items
BANDAGE ESMARK 6X9 LF (GAUZE/BANDAGES/DRESSINGS) IMPLANT
BNDG ESMARK 6X9 LF (GAUZE/BANDAGES/DRESSINGS)
CANISTER SUCTION 2500CC (MISCELLANEOUS) ×3 IMPLANT
CANNULA VESSEL 3MM 2 BLNT TIP (CANNULA) ×3 IMPLANT
CLIP TI MEDIUM 24 (CLIP) ×3 IMPLANT
CLIP TI WIDE RED SMALL 24 (CLIP) ×3 IMPLANT
CLIP TI WIDE RED SMALL 6 (CLIP) ×3 IMPLANT
CUFF TOURNIQUET SINGLE 24IN (TOURNIQUET CUFF) IMPLANT
CUFF TOURNIQUET SINGLE 34IN LL (TOURNIQUET CUFF) IMPLANT
CUFF TOURNIQUET SINGLE 44IN (TOURNIQUET CUFF) IMPLANT
DRAIN SNY WOU (WOUND CARE) IMPLANT
DRAPE PROXIMA HALF (DRAPES) IMPLANT
DRAPE X-RAY CASS 24X20 (DRAPES) ×3 IMPLANT
ELECT REM PT RETURN 9FT ADLT (ELECTROSURGICAL) ×3
ELECTRODE REM PT RTRN 9FT ADLT (ELECTROSURGICAL) ×2 IMPLANT
EVACUATOR SILICONE 100CC (DRAIN) IMPLANT
GAUZE SPONGE 4X4 16PLY XRAY LF (GAUZE/BANDAGES/DRESSINGS) ×3 IMPLANT
GLOVE BIO SURGEON STRL SZ 6.5 (GLOVE) ×15 IMPLANT
GLOVE BIO SURGEON STRL SZ7.5 (GLOVE) ×6 IMPLANT
GLOVE BIOGEL PI IND STRL 6.5 (GLOVE) ×6 IMPLANT
GLOVE BIOGEL PI IND STRL 7.0 (GLOVE) ×4 IMPLANT
GLOVE BIOGEL PI IND STRL 7.5 (GLOVE) ×4 IMPLANT
GLOVE BIOGEL PI IND STRL 8 (GLOVE) ×2 IMPLANT
GLOVE BIOGEL PI INDICATOR 6.5 (GLOVE) ×3
GLOVE BIOGEL PI INDICATOR 7.0 (GLOVE) ×2
GLOVE BIOGEL PI INDICATOR 7.5 (GLOVE) ×2
GLOVE BIOGEL PI INDICATOR 8 (GLOVE) ×1
GLOVE ECLIPSE 6.5 STRL STRAW (GLOVE) ×6 IMPLANT
GLOVE ECLIPSE 7.0 STRL STRAW (GLOVE) ×6 IMPLANT
GOWN STRL REUS W/ TWL LRG LVL3 (GOWN DISPOSABLE) ×16 IMPLANT
GOWN STRL REUS W/TWL LRG LVL3 (GOWN DISPOSABLE) ×8
KIT BASIN OR (CUSTOM PROCEDURE TRAY) ×3 IMPLANT
KIT ROOM TURNOVER OR (KITS) ×3 IMPLANT
LIQUID BAND (GAUZE/BANDAGES/DRESSINGS) ×3 IMPLANT
LOOP VESSEL MAXI BLUE (MISCELLANEOUS) ×3 IMPLANT
LOOP VESSEL MINI RED (MISCELLANEOUS) ×3 IMPLANT
NS IRRIG 1000ML POUR BTL (IV SOLUTION) ×9 IMPLANT
PACK PERIPHERAL VASCULAR (CUSTOM PROCEDURE TRAY) ×3 IMPLANT
PAD ARMBOARD 7.5X6 YLW CONV (MISCELLANEOUS) ×6 IMPLANT
PADDING CAST COTTON 6X4 STRL (CAST SUPPLIES) IMPLANT
SET COLLECT BLD 21X3/4 12 (NEEDLE) ×3 IMPLANT
SPONGE LAP 18X18 X RAY DECT (DISPOSABLE) ×3 IMPLANT
SPONGE SURGIFOAM ABS GEL 100 (HEMOSTASIS) IMPLANT
STAPLER VISISTAT 35W (STAPLE) IMPLANT
STOPCOCK 4 WAY LG BORE MALE ST (IV SETS) ×3 IMPLANT
SUT ETHILON 3 0 PS 1 (SUTURE) IMPLANT
SUT PROLENE 5 0 C 1 24 (SUTURE) ×3 IMPLANT
SUT PROLENE 6 0 CC (SUTURE) ×18 IMPLANT
SUT PROLENE 7 0 BV 1 (SUTURE) IMPLANT
SUT PROLENE 7 0 BV1 MDA (SUTURE) ×3 IMPLANT
SUT SILK 2 0 SH (SUTURE) ×3 IMPLANT
SUT SILK 3 0 (SUTURE) ×2
SUT SILK 3-0 18XBRD TIE 12 (SUTURE) ×4 IMPLANT
SUT VIC AB 2-0 CTX 36 (SUTURE) ×3 IMPLANT
SUT VIC AB 3-0 SH 27 (SUTURE) ×9
SUT VIC AB 3-0 SH 27X BRD (SUTURE) ×18 IMPLANT
SUT VIC AB 4-0 PS2 27 (SUTURE) ×12 IMPLANT
TAPE UMBILICAL COTTON 1/8X30 (MISCELLANEOUS) IMPLANT
TRAY FOLEY W/METER SILVER 16FR (SET/KITS/TRAYS/PACK) ×3 IMPLANT
TUBING EXTENTION W/L.L. (IV SETS) ×3 IMPLANT
UNDERPAD 30X30 INCONTINENT (UNDERPADS AND DIAPERS) ×3 IMPLANT
WATER STERILE IRR 1000ML POUR (IV SOLUTION) ×3 IMPLANT

## 2015-02-25 NOTE — H&P (Signed)
[facility] [place] HISTORY AND PHYSICAL    History of Present Illness:  Patient is a 74 y.o. year old male who presents for evaluation of a chronic painful right fifth metatarsal ulcer. The patient states he has had this ulcer for several years. It was initially followed by a podiatrist. They told him he may have osteomyelitis. At that point he wished to see Dr. Lajoyce Corners for second opinion. Dr. Lajoyce Corners recently saw him in the office and referred him to [place] due to concerns over decreased perfusion in the right leg. The patient currently controls the pain in his right foot with a Lidoderm patch. He has pain right over the right fifth metatarsal. He does not really complain of drainage from this. He actively works daily at [employer]. He denies claudication symptoms. He denies prior myocardial infarction or stroke. He does have a history of hypertension which is controlled. He is a former tobacco abuser but quit 30 years ago. He also has chronic lower extremity edema. 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      Past Medical History   Diagnosis  Date   .  Hypertension     .  Critical lower limb ischemia     .  Peripheral arterial disease [facility])         Past Surgical History   Procedure  Laterality  Date   .  Back surgery         Social History Social History   Substance Use Topics   .  Smoking status:  Former Smoker       Types:  Cigarettes       Quit date:  01/22/1985   .  Smokeless tobacco:  None   .  Alcohol Use:  No     Family History No family history on file.  Allergies    Allergies   Allergen  Reactions   .  Sulfur  Rash        Current Outpatient Prescriptions   Medication  Sig  Dispense  Refill   .  acetaminophen (TYLENOL) 500 MG tablet  Take 1,500 mg by mouth every 6 (six) hours as needed.       Marland Kitchen  HYDROcodone-acetaminophen (NORCO) 10-325 MG per tablet  Take 1 tablet by mouth every 8 (eight) hours as needed.  30 tablet  0   .  ramipril (ALTACE)  10 MG capsule  Take 10 mg by mouth daily.       .  cephALEXin (KEFLEX) 500 MG capsule  Take 1 capsule (500 mg total) by mouth 3 (three) times daily. (Patient not taking: Reported on 01/23/2015)  30 capsule  0   .  hydrochlorothiazide (MICROZIDE) 12.5 MG capsule  Take 1 capsule (12.5 mg total) by mouth daily. (Patient not taking: Reported on 01/23/2015)  90 capsule  3      No current facility-administered medications for this visit.     ROS:    General:  No weight loss, Fever, chills  HEENT: No recent headaches, no nasal bleeding, no visual changes, no sore throat  Neurologic: No dizziness, blackouts, seizures. No recent symptoms of stroke or mini- stroke. No recent episodes of slurred speech, or temporary blindness.  Cardiac: No recent episodes of chest pain/pressure, no shortness of breath at rest.  No shortness of breath with exertion.  Denies history of atrial fibrillation or irregular heartbeat  Vascular: No history of rest pain in feet.  No history of claudication.  +  history of non-healing ulcer, No history of DVT    Pulmonary: No home oxygen, no productive cough, no hemoptysis,  No asthma or wheezing  Musculoskeletal:  [x ] Arthritis, [ ]  Low back pain,  [ ]  Joint pain  Hematologic:No history of hypercoagulable state.  No history of easy bleeding.  No history of anemia  Gastrointestinal: No hematochezia or melena,  No gastroesophageal reflux, no trouble swallowing  Urinary: [ ]  chronic Kidney disease, [ ]  on HD - [ ]  MWF or [ ]  TTHS, [ ]  Burning with urination, [ ]  Frequent urination, [ ]  Difficulty urinating;    Skin: No rashes  Psychological: No history of anxiety,  No history of depression   Physical Examination    Filed Vitals:   02/25/15 0551  BP: 168/49  Pulse: 51  Temp: 97.5 F (36.4 C)  TempSrc: Oral  Resp: 16  SpO2: 100%    Body mass index is 24.98 kg/(m^2).  General:  Alert and oriented, no acute distress HEENT: Normal Neck: Bilateral carotid bruits  left greater than right Pulmonary: Clear to auscultation bilaterally Cardiac: Regular Rate and Rhythm without murmur Abdomen: Soft, non-tender, non-distended, no mass Skin: Diffuse circumferential hemosiderin staining gaiter area bilateral lower extremities, 2+ edema right lower extremity 1+ edema left lower extremity, punctate ulcer over right fifth metatarsal head plantar aspect of foot no drainage no erythema Extremity Pulses:  2+ radial, brachial, femoral, absent popliteal dorsalis pedis, posterior tibial pulses bilaterally Musculoskeletal:  edema as mentioned above         Neurologic: Upper and lower extremity motor 5/5 and symmetric  DATA:   Patient had bilateral ABIs performed today which were 0.3 on the right 0.52 on the left. I reviewed and interpreted this study. The patient also recently had a duplex ultrasound on September 22 at [facility]. This showed bilateral superficial femoral artery occlusions. There was also common femoral disease on the right side as well as the left   ASSESSMENT:  Severe bilateral lower extremity arterial occlusive disease. Possible osteomyelitis right foot #2 bilateral asymptomatic carotid bruits   PLAN:  Right fem pop today Fabienne Bruns, MD [facility] [place] Office: [phone] Pager: [phone]

## 2015-02-25 NOTE — Progress Notes (Signed)
Dr. Myra Gianotti notified of patient going from NSR rate in the 70-80s to junctional rhythm in the 40s.  New orders received and placed in record.

## 2015-02-25 NOTE — Transfer of Care (Signed)
Immediate Anesthesia Transfer of Care Note  Patient: Kevin Reilly  Procedure(s) Performed: Procedure(s): ENDARTERECTOMYRight  FEMORAL Endarterectomy with profundaplasty. (Right) BYPASS GRAFT FEMORAL-POPLITEAL ARTERY using non-reversed saphenous vein. (Right)  Patient Location: PACU  Anesthesia Type:General  Level of Consciousness: awake, oriented, patient cooperative and lethargic  Airway & Oxygen Therapy: Patient Spontanous Breathing and Patient connected to nasal cannula oxygen  Post-op Assessment: Report given to RN, Post -op Vital signs reviewed and stable and Patient moving all extremities  Post vital signs: Reviewed and stable  Last Vitals:  Filed Vitals:   02/25/15 1339  BP: 191/62  Pulse:   Temp: 36.8 C  Resp: 10    Complications: No apparent anesthesia complications

## 2015-02-25 NOTE — Anesthesia Preprocedure Evaluation (Addendum)
Anesthesia Evaluation  Patient identified by MRN, date of birth, ID band Patient awake    Reviewed: Allergy & Precautions, NPO status , Patient's Chart, lab work & pertinent test results  Airway Mallampati: II  TM Distance: >3 FB Neck ROM: Full    Dental  (+) Teeth Intact, Dental Advisory Given   Pulmonary former smoker,    breath sounds clear to auscultation       Cardiovascular hypertension,  Rhythm:Regular Rate:Normal     Neuro/Psych    GI/Hepatic   Endo/Other    Renal/GU      Musculoskeletal   Abdominal   Peds  Hematology   Anesthesia Other Findings   Reproductive/Obstetrics                            Anesthesia Physical Anesthesia Plan  ASA: III  Anesthesia Plan: General   Post-op Pain Management:    Induction: Intravenous  Airway Management Planned: Oral ETT  Additional Equipment:   Intra-op Plan:   Post-operative Plan: Extubation in OR  Informed Consent: I have reviewed the patients History and Physical, chart, labs and discussed the procedure including the risks, benefits and alternatives for the proposed anesthesia with the patient or authorized representative who has indicated his/her understanding and acceptance.   Dental advisory given  Plan Discussed with: CRNA and Anesthesiologist  Anesthesia Plan Comments: (PVD Hypertension Former smoker  Plan GA   Kipp Brood)       Anesthesia Quick Evaluation

## 2015-02-25 NOTE — Op Note (Signed)
Procedure: Right femoral to below knee popliteal bypass with non-reversed ipsilateral great saphenous vein, right common femoral endarterectomy with profundaplasty (vein patch)  Preoperative diagnosis: osteomyelitis right foot  Postoperative diagnosis: Same  Anesthesia: General  Asst.: Lianne Cure, PA-C Newton Pigg, PA-C  Operative findings:     varicose saphenous vein 6 mm tapering to 3 mm  Operative details: After obtaining informed consent, the patient was taken to the operating room. The patient was placed in supine position on the operating room table. After induction of general anesthesia and endotracheal intubation, a Foley catheter was placed. Next, the patient's entire right lower extremity was prepped and draped in the usual sterile fashion. A longitudinal incision was then made in the right groin and carried down through the subcutaneous tissues to expose the right common femoral artery.   The common femoral artery was dissected free circumferentially. There was no pulse within the common femoral artery consistent with preop angio. The distal external iliac artery was dissected free circumferentially underneath the inguinal ligament. There was a good pulse within this.   A vessel loop was also placed around the distal external iliac artery. The profunda was dissected free circumferentially at its second branch point where the plaque in the profunda seemed to thin out.  The SFA was also dissected free circumferentially and a vessel loop placed around this.  Next the saphenofemoral junction was identified in the medial portion of the groin incision and this was harvested through several skip incisions on the medial aspect of the leg.  Side branches were ligated and divided between silk ties or clips.  The vein had some varicosities and was dilated to 6 mm but it tapered to 3 mm diameter in the distal below knee segment.  The vein harvest incision was deepened into the fascia at  the below knee segment and the below knee popliteal space was entered.  The popliteal artery was dissected free circumferentially.  It was soft on palpation.  A tunnel was then created between the heads of the gastrocnemius muscle subsartorial up to the groin.  The vein was ligated distally and at the saphenofemoral junction with a 2 0 silk tie.  The vein was gently distended with heparinized saline and inspected for hemostasis.  Two large varicosities were oversewn with a 7 0 prolene running suture.  The patient was given 8000 units of heparin.  After appropriate circulation time, the distal right external iliac artery was controlled with a small Cooley clamp. The distal common femoral artery was controlled with a peripheral Debakey clamp.   A longitudinal opening was made in the common femoral artery on its anterior surface.  There was a large amount of calcified plaque and an endarterectomy was made from the proximal common femoral down to the proximal profunda and into the SFA which was everted.  A good endpoint was obtained in the profunda. A vein patch was then created from a segment of saphenous vein which was reversed and opened longitudinally.  At completion of the patch flow was restored to the profunda and there was good doppler flow.  There were 3 holes on the posterior wall of the common femoral repaired with figure of 8 6 0 prolene sutures.  The common femoral was then controlled proximally and distally with vessel loops and a longitudinal opening was made in the patch adjacent to the femoral bifurcation. The vein was placed in a non reversed configuration.    The vein was spatulated and sewn end to side to  the artery using a running 6 0 Prolene.  Just prior to completion anastomosis everything was forebled backbled and thoroughly flushed. Proximal clamp and distal clamps were removed and there was good pulsatile flow in the profunda femoris artery immediately. The SFA was chronically occluded. The  vein was varicose but there were some competent valves distally and these were lysed with a valvulotome.   The graft was then brought through the subsartorial tunnel down to the below-knee popliteal artery after marking for orientation. The below-knee popliteal artery was controlled proximally and distally with a fine bulldog clamp. A longitudinal opening was made in the distal below-knee popliteal artery in an area that was fairly free of calcification. The graft was then cut to length and spatulated and sewn end of graft to side of artery using running 6-0 Prolene suture.  This was a the portion of the vein where there was a transition from 6 to 3 mm diameter.  At completion of the anastomosis everything was forebled backbled and thoroughly flushed. The remainder of the anastomosis was completed and all clamps were removed restoring pulsatile flow to the below-knee popliteal artery. An intraoperative arteriogram was obtained with a 21 gauge butterfly in the proximal vein graft.  This showed a patent distal anastomosis with 3 vessel runoff.  The patient had monophasic to biphasic Doppler flow in the posterior tibial and dorsalis pedis areas of the foot.  One repair stitch was placed in the lateral wall of the proximal anastomosis of the patch on the profunda and at the posterior wall of the common femoral at a prior area of bleeding.  The patient had been given an additional 5000 units of heparin during the case.    After hemostasis was obtained, the deep layers and subcutaneous layers of the below-knee popliteal incision were closed with running 3-0 Vicryl suture. The skin was closed with a 4 0 vicryl subcuticular stitch.   The saphenectomy incisions were closed with running 3 0 vicryl follow by 4 0 vicryl subcuticular.  The groin was inspected and found to be hemostatic. This was then closed in multiple layers of running 2 0 and 3-0 Vicryl suture and 4-0 subcuticular stitch. The patient tolerated the  procedure well and there were no complications. Instrument sponge and needle counts were correct at the end of the case. Patient was taken to the recovery room in stable condition.  Fabienne Bruns, MD [facility] [place] Office: [phone] Pager: [phone]

## 2015-02-25 NOTE — Progress Notes (Signed)
Anesthesiology Follow-up:  Kevin Reilly is a 74 year old male with  bilateral lower limb arterial occlusive disease and hypertension. He has no previous history of heart disease and prior to surgery he was seen by Dr. Nanetta Batty. He underwent a nuclear stress test which was read as low risk with an ejection fraction of 49%. Dr. Allyson Sabal considered him a low cardiovascular risk for surgery.   Today he underwent right femoral to below knee popliteal bypass with vein graft. Intraoperatively he had episodes of bradycardia with an apparent junctional rhythm with a rate in the 30-40.The BP was stable while he was in this rhythm.  Following surgery he was evaluated in the PACU and denied chest pain. He remained hemodynamically stable but again had short episodes of what appeared to be junctional rhythm with a narrow complex and a rate in the 40s. A twelve-lead EKG was negative for ST-T changes and showed sinus rhythm with rate of 65.  Impression intermittent junctional rhythm in 74 year old male with hypertension on verapamil. He is hemodynamically stable in NSR at present   1. Repeat ECG in a.m. 2. Check troponins now and in a.m. 3. Check electrolytes and CBC 4 follow-up in a.m.  Kipp Brood,

## 2015-02-25 NOTE — Progress Notes (Signed)
  Day of Surgery Note    Subjective:  Sleeping -- denies pain  Filed Vitals:   02/25/15 1339  BP: 191/62  Pulse: 70  Temp: 98.2 F (36.8 C)  Resp: 10    Incisions:   Right groin is without hematoma; other incisions look good Extremities:  Brisk right DP doppler signal;  Cardiac:  regular Lungs:  Non labored   Assessment/Plan:  This is a 74 y.o. male who is s/p right femoral to below knee popliteal bypass grafting with non reversed saphenous vein.  -patent bypass graft with brisk doppler flow right DP -IVF for hydration given pre-op creatinine is 1.34.  ACEI held post operatively. -keep legs elevated to help with swelling -transfer to 3 south when bed available -hypertensive with systolic BP 180's-will give hydralazine prn   Doreatha Massed, PA-C 02/25/2015 1:56 PM

## 2015-02-25 NOTE — Anesthesia Procedure Notes (Signed)
Procedure Name: Intubation Date/Time: 02/25/2015 7:43 AM Performed by: Roney Mans P Pre-anesthesia Checklist: Patient identified, Timeout performed, Emergency Drugs available, Suction available and Patient being monitored Patient Re-evaluated:Patient Re-evaluated prior to inductionOxygen Delivery Method: Circle system utilized Preoxygenation: Pre-oxygenation with 100% oxygen Intubation Type: IV induction Ventilation: Mask ventilation without difficulty and Oral airway inserted - appropriate to patient size Laryngoscope Size: Mac and 4 Grade View: Grade I Tube size: 7.5 mm Number of attempts: 1 Airway Equipment and Method: Stylet Placement Confirmation: ETT inserted through vocal cords under direct vision,  breath sounds checked- equal and bilateral and positive ETCO2 Secured at: 23 cm Tube secured with: Tape

## 2015-02-26 ENCOUNTER — Encounter (HOSPITAL_COMMUNITY): Payer: Self-pay | Admitting: Vascular Surgery

## 2015-02-26 ENCOUNTER — Encounter (HOSPITAL_COMMUNITY): Payer: Medicare Other

## 2015-02-26 LAB — CBC
HEMATOCRIT: 33.3 % — AB (ref 39.0–52.0)
Hemoglobin: 10.7 g/dL — ABNORMAL LOW (ref 13.0–17.0)
MCH: 31.7 pg (ref 26.0–34.0)
MCHC: 32.1 g/dL (ref 30.0–36.0)
MCV: 98.5 fL (ref 78.0–100.0)
Platelets: 192 10*3/uL (ref 150–400)
RBC: 3.38 MIL/uL — ABNORMAL LOW (ref 4.22–5.81)
RDW: 13.8 % (ref 11.5–15.5)
WBC: 9.2 10*3/uL (ref 4.0–10.5)

## 2015-02-26 LAB — BASIC METABOLIC PANEL
ANION GAP: 9 (ref 5–15)
BUN: 22 mg/dL — ABNORMAL HIGH (ref 6–20)
CALCIUM: 9.2 mg/dL (ref 8.9–10.3)
CO2: 21 mmol/L — ABNORMAL LOW (ref 22–32)
CREATININE: 1.1 mg/dL (ref 0.61–1.24)
Chloride: 111 mmol/L (ref 101–111)
Glucose, Bld: 111 mg/dL — ABNORMAL HIGH (ref 65–99)
Potassium: 4.4 mmol/L (ref 3.5–5.1)
SODIUM: 141 mmol/L (ref 135–145)

## 2015-02-26 LAB — CK TOTAL AND CKMB (NOT AT ARMC)
CK TOTAL: 144 U/L (ref 49–397)
CK, MB: 2.3 ng/mL (ref 0.5–5.0)
RELATIVE INDEX: 1.6 (ref 0.0–2.5)

## 2015-02-26 LAB — TROPONIN I: Troponin I: <0.03 ng/mL (ref <0.031)

## 2015-02-26 NOTE — Care Management Note (Signed)
Case Management Note  Patient Details  Name: VIDUR ARCOS MRN: [id number] Date of Birth: [birth year]  Subjective/Objective:         Right femoral to below knee popliteal bypass            Action/Plan: NCM spoke to pt and wanted NCM to speak to niece, Victorino Dike Hippert # [phone]. States he does want RW for home but not bedside commode. Contacted niece, Victorino Dike. Offered choice for [facility]. States she prefers [facility]. States she has friend's that she plans to private pay to stay with him at night and some during the day. Contacted [facility] for HHPT. Message left for attending for orders for HHPT and F2F. Contacted [facility] DME rep for RW for home.   Expected Discharge Date:  02/28/2015               Expected Discharge Plan:  Home w Home Health Services  In-House Referral:     Discharge planning Services  CM Consult     DME Arranged:  Walker rolling DME Agency:  [facility]  HH Arranged:  PT [facility] Agency:  [facility]  Status of Service:  Completed, signed off  Medicare Important Message Given:    Date Medicare IM Given:    Medicare IM give by:    Date Additional Medicare IM Given:    Additional Medicare Important Message give by:     If discussed at Long Length of Stay Meetings, dates discussed:    Additional Comments:  Elmore, Stophel, RN 02/26/2015, 11:48 AM

## 2015-02-26 NOTE — Progress Notes (Signed)
Utilization review completed.  

## 2015-02-26 NOTE — Progress Notes (Addendum)
[facility] [place]  Subjective  - He states he is doing well.     Objective 137/50 80 98.8 F (37.1 C) (Oral) 19 87%  Intake/Output Summary (Last 24 hours) at 02/26/15 0726 Last data filed at 02/26/15 4166  Gross per 24 hour  Intake 2451.67 ml  Output   2730 ml  Net -278.33 ml    Incisions are clean and dry without hematomas Doppler signal PT/DP cap refill brisk on digits Heart RRR Lungs non labored breathing  Assessment/Planning: POD # 1 Right femoral to below knee popliteal bypass with non-reversed ipsilateral great saphenous vein, right common femoral endarterectomy with profundaplasty (vein patch)  Transfer to 2W PT ambulation, advance diet as tolerates Possible discharge in 1-2 days Intraoperatively he had episodes of bradycardia with an apparent junctional rhythm with a rate in the 30-40.The BP was stable while he was in this rhythm.  A twelve-lead EKG was negative for ST-T changes and showed sinus rhythm with rate of 65.  Clinton Gall[facility] 02/26/2015 7:26 AM -- Brisk doppler flow right foot No hematoma Ambulate today, transfer 2w Most likely d/c tomorrow  Fabienne Bruns, MD [facility] [place] Office: [phone] Pager: [phone]  Laboratory Lab Results:  Recent Labs  02/25/15 1842 02/26/15 0317  WBC 10.5 9.2  HGB 10.0* 10.7*  HCT 31.1* 33.3*  PLT 191 192   BMET  Recent Labs  02/25/15 1842 02/26/15 0317  NA 140 141  K 4.5 4.4  CL 112* 111  CO2 22 21*  GLUCOSE 107* 111*  BUN 27* 22*  CREATININE 1.00 1.10  CALCIUM 9.1 9.2    COAG Lab Results  Component Value Date   INR 1.23 02/21/2015   No results found for: PTT

## 2015-02-26 NOTE — Evaluation (Signed)
Physical Therapy Evaluation Patient Details Name: Kevin Reilly MRN: [id number] DOB: Jul 16, 1940 Today's Date: 02/26/2015   History of Present Illness  Pt is a 74 y/o M who presented for evaluation of chronic painful Rt 5th metatarsal ulcer w/ osteomyelitis and now s/p Rt fem pop bypass.  Pt's PMH includes back surgery.   Clinical Impression  Patient is s/p above surgery resulting in functional limitations due to the deficits listed below (see PT Problem List). Kevin Reilly lives alone and does not have any assist available at d/c.  Discussed w/ pt concerns for fall if pt returns home as he is currently unstable even w/ use of RW.  Discussed ST SNF as an option at d/c prior to returning home to increase functional independence and safety.  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      Follow Up Recommendations SNF;Supervision for mobility/OOB    Equipment Recommendations  Rolling walker with 5" wheels;3in1 (PT)    Recommendations for Other Services OT consult     Precautions / Restrictions Precautions Precautions: Fall Restrictions Weight Bearing Restrictions: No      Mobility  Bed Mobility               General bed mobility comments: Pt sitting in recliner upon PT arrival  Transfers Overall transfer level: Needs assistance Equipment used: Rolling walker (2 wheeled) Transfers: Sit to/from Stand Sit to Stand: Min assist         General transfer comment: Min assist stabilizing RW, pt is slow to stand 2/2 Rt LE tightness.    Ambulation/Gait Ambulation/Gait assistance: Min guard Ambulation Distance (Feet): 90 Feet Assistive device: Rolling walker (2 wheeled) Gait Pattern/deviations: Step-through pattern;Decreased step length - right;Decreased step length - left;Decreased stride length;Antalgic;Trunk flexed   Gait velocity interpretation: <1.8 ft/sec, indicative of risk for recurrent falls General Gait Details: Cues for  standing upright and walking within the RW as pt demonstrates trunk flexion.  Close min guard assist, especially during turns as pt demonstrates increased instability.  Very slow gait speed and min foot clearance Bil.  Stairs            Wheelchair Mobility    Modified Rankin (Stroke Patients Only)       Balance Overall balance assessment: Needs assistance Sitting-balance support: Bilateral upper extremity supported;Feet supported Sitting balance-Leahy Scale: Good     Standing balance support: Bilateral upper extremity supported;During functional activity Standing balance-Leahy Scale: Poor Standing balance comment: Relies on RW for support                             Pertinent Vitals/Pain Pain Assessment: Faces Faces Pain Scale: Hurts even more Pain Location: Rt groin Pain Descriptors / Indicators: Aching;Sore;Tightness Pain Intervention(s): Limited activity within patient's tolerance;Monitored during session    Home Living Family/patient expects to be discharged to:: Private residence Living Arrangements: Alone Available Help at Discharge:  (pt reports no assist from friends, family available) Type of Home: House Home Access: Stairs to enter Entrance Stairs-Rails: None Entrance Stairs-Number of Steps: 2 Home Layout: Two level Home Equipment: None Additional Comments: Pt lives alone and his bedroom is located on the second floor.  He reports he has a pull out couch that he could utilize temporarily on the first floor.    Prior Function Level of Independence: Independent  Hand Dominance        Extremity/Trunk Assessment   Upper Extremity Assessment: Overall WFL for tasks assessed           Lower Extremity Assessment: RLE deficits/detail RLE Deficits / Details: limited ROM and weakness as expected following surgery       Communication   Communication: No difficulties  Cognition Arousal/Alertness: Awake/alert Behavior  During Therapy: WFL for tasks assessed/performed Overall Cognitive Status: Within Functional Limits for tasks assessed                      General Comments General comments (skin integrity, edema, etc.): Friend in room who expresses concern over pt not having assist at home.  Discussed w/ pt concerns for fall if pt returns home as he is currently unstable even w/ use of RW.  Discussed ST SNF as an option at d/c prior to returning home to increase functional independence and safety.  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  Notified CM about pt's situation.    Exercises General Exercises - Lower Extremity Ankle Circles/Pumps: AROM;Both;10 reps;Seated Long Arc Quad: AROM;Both;10 reps;Seated Other Exercises Other Exercises: Encouraged pt to ambulate in hallway w/ nursing staff at least 2 more times today      Assessment/Plan    PT Assessment Patient needs continued PT services  PT Diagnosis Difficulty walking;Acute pain   PT Problem List Decreased strength;Decreased activity tolerance;Decreased range of motion;Decreased balance;Decreased mobility;Decreased knowledge of use of DME;Decreased safety awareness;Decreased knowledge of precautions;Pain  PT Treatment Interventions DME instruction;Gait training;Stair training;Functional mobility training;Therapeutic activities;Therapeutic exercise;Balance training;Neuromuscular re-education;Modalities;Patient/family education   PT Goals (Current goals can be found in the Care Plan section) Acute Rehab PT Goals Patient Stated Goal: to go home PT Goal Formulation: With patient Time For Goal Achievement: 03/05/15 Potential to Achieve Goals: Good    Frequency Min 3X/week   Barriers to discharge Inaccessible home environment;Decreased caregiver support No assist available at d/c and 2 steps to enter home    Co-evaluation               End of Session Equipment Utilized During Treatment:  Gait belt Activity Tolerance: Patient limited by pain Patient left: in chair;with call bell/phone within reach;with family/visitor present Nurse Communication: Mobility status;Precautions         Time: 1610-9604 PT Time Calculation (min) (ACUTE ONLY): 22 min   Charges:   PT Evaluation $Initial PT Evaluation Tier I: 1 Procedure     PT G CodesMichail Jewels PT, DPT [phone] Pager: [phone] 02/26/2015, 11:43 AM

## 2015-02-26 NOTE — Evaluation (Signed)
Occupational Therapy Evaluation Patient Details Name: Kevin Reilly MRN: [id number] DOB: 01-07-1941 Today's Date: 02/26/2015    History of Present Illness Pt is a 74 y/o M who presented for evaluation of chronic painful Rt 5th metatarsal ulcer w/ osteomyelitis and now s/p Rt fem pop bypass.  Pt's PMH includes back surgery.    Clinical Impression   Pt was independent prior to admission.  Generalized weakness, 9/10 pain and impaired balance interfere with pt's ability to perform at his baseline.  Pt is declining ST rehab in SNF. Brother reports he can stay with pt and per CM note, niece to also pay for pt to have assistance upon discharge.  Will follow acutely.    Follow Up Recommendations  Home health OT;Supervision/Assistance - 24 hour    Equipment Recommendations  3 in 1 bedside comode    Recommendations for Other Services       Precautions / Restrictions Precautions Precautions: Fall Restrictions Weight Bearing Restrictions: No      Mobility Bed Mobility Overal bed mobility: Needs Assistance Bed Mobility: Sit to Supine       Sit to supine: Min assist   General bed mobility comments: assist for R LE  Transfers Overall transfer level: Needs assistance Equipment used: Rolling walker (2 wheeled) Transfers: Sit to/from Stand Sit to Stand: Min assist              Balance                                            ADL Overall ADL's : Needs assistance/impaired Eating/Feeding: Set up;Bed level   Grooming: Wash/dry hands;Min guard;Standing   Upper Body Bathing: Set up;Sitting   Lower Body Bathing: Minimal assistance;Sit to/from stand   Upper Body Dressing : Set up;Sitting   Lower Body Dressing: Minimal assistance;Sit to/from stand   Toilet Transfer: Minimal assistance;Ambulation;RW           Functional mobility during ADLs: Minimal assistance;Rolling walker General ADL Comments: Educated pt in multiple uses of 3 in1.  Pt had  initially refused it according to CM note, pt now agreeable, CM notified.     Vision     Perception     Praxis      Pertinent Vitals/Pain Pain Assessment: 0-10 Pain Score: 9  Pain Location: R LE Pain Descriptors / Indicators: Aching Pain Intervention(s): Limited activity within patient's tolerance;Monitored during session;Repositioned;Patient requesting pain meds-RN notified     Hand Dominance Left   Extremity/Trunk Assessment Upper Extremity Assessment Upper Extremity Assessment: Overall WFL for tasks assessed   Lower Extremity Assessment Lower Extremity Assessment: Defer to PT evaluation       Communication Communication Communication: No difficulties   Cognition Arousal/Alertness: Awake/alert Behavior During Therapy: WFL for tasks assessed/performed Overall Cognitive Status: Within Functional Limits for tasks assessed                     General Comments       Exercises       Shoulder Instructions      Home Living Family/patient expects to be discharged to:: Private residence Living Arrangements: Alone Available Help at Discharge: Family;Available 24 hours/day (brother can stay with pt, family to hire assistance) Type of Home: House Home Access: Stairs to enter [employer] of Steps: 2 Entrance Stairs-Rails: None Home Layout: Two level (bedroom upstairs, full bath downstairs) Alternate  Level Stairs-Number of Steps: 13 Alternate Level Stairs-Rails: Left Bathroom Shower/Tub: Walk-in shower (on first floor)   Bathroom Toilet: Standard     Home Equipment: None   Additional Comments: Pt plans to use his pull out couch and remain on the first floor intiallly.      Prior Functioning/Environment Level of Independence: Independent             OT Diagnosis: Generalized weakness;Acute pain   OT Problem List: Decreased strength;Decreased activity tolerance;Impaired balance (sitting and/or standing);Decreased knowledge of use of DME  or AE;Pain   OT Treatment/Interventions: Self-care/ADL training;DME and/or AE instruction;Therapeutic activities;Patient/family education;Balance training    OT Goals(Current goals can be found in the care plan section) Acute Rehab OT Goals Patient Stated Goal: to go home OT Goal Formulation: With patient Time For Goal Achievement: 03/05/15 Potential to Achieve Goals: Good ADL Goals Pt Will Perform Grooming: with supervision;standing Pt Will Perform Lower Body Bathing: with supervision;with adaptive equipment;sit to/from stand Pt Will Perform Lower Body Dressing: with supervision;with adaptive equipment;sit to/from stand Pt Will Transfer to Toilet: with supervision;ambulating;bedside commode (over toilet) Pt Will Perform Toileting - Clothing Manipulation and hygiene: with supervision;sit to/from stand Pt Will Perform Tub/Shower Transfer: Shower transfer;with supervision;ambulating;3 in 1;rolling walker  OT Frequency: Min 2X/week   Barriers to D/C:            Co-evaluation              End of Session Equipment Utilized During Treatment: Rolling walker;Gait belt Nurse Communication: Patient requests pain meds  Activity Tolerance: Patient limited by pain Patient left: in bed;with call bell/phone within reach;with family/visitor present   Time: 1610-9604 OT Time Calculation (min): 17 min Charges:  OT General Charges $OT Visit: 1 Procedure OT Evaluation $Initial OT Evaluation Tier I: 1 Procedure G-Codes:    Evern Bio 02/26/2015, 2:37 PM  [phone]

## 2015-02-27 ENCOUNTER — Other Ambulatory Visit: Payer: Self-pay

## 2015-02-27 ENCOUNTER — Encounter (HOSPITAL_COMMUNITY): Payer: Medicare Other

## 2015-02-27 DIAGNOSIS — I739 Peripheral vascular disease, unspecified: Secondary | ICD-10-CM

## 2015-02-27 DIAGNOSIS — Z95828 Presence of other vascular implants and grafts: Secondary | ICD-10-CM

## 2015-02-27 MED ORDER — OXYCODONE HCL 5 MG PO TABS: 5.0000 mg | ORAL_TABLET | Freq: Four times a day (QID) | ORAL | Status: DC | PRN

## 2015-02-27 NOTE — Progress Notes (Signed)
Patient given d/c instructions. Educated on follow up appointments and medications. Home health supplies given to patient. No questions or concerns at this time.

## 2015-02-27 NOTE — Progress Notes (Addendum)
  Progress Note    02/27/2015 7:47 AM 2 Days Post-Op  Subjective:  Ready to go home  Tm 99.6 now afebrile  Filed Vitals:   02/27/15 0424  BP: 165/57  Pulse: 87  Temp: 98.6 F (37 C)  Resp: 19    Physical Exam: Cardiac:  regular Lungs:  Non labored Incisions:  All incisions are c/d/i Extremities:  2+ palpable right DP   CBC    Component Value Date/Time   WBC 9.2 02/26/2015 0317   RBC 3.38* 02/26/2015 0317   HGB 10.7* 02/26/2015 0317   HCT 33.3* 02/26/2015 0317   PLT 192 02/26/2015 0317   MCV 98.5 02/26/2015 0317   MCH 31.7 02/26/2015 0317   MCHC 32.1 02/26/2015 0317   RDW 13.8 02/26/2015 0317   LYMPHSABS 0.8 07/02/2013 1430   MONOABS 0.7 07/02/2013 1430   EOSABS 0.0 07/02/2013 1430   BASOSABS 0.0 07/02/2013 1430    BMET    Component Value Date/Time   NA 141 02/26/2015 0317   K 4.4 02/26/2015 0317   CL 111 02/26/2015 0317   CO2 21* 02/26/2015 0317   GLUCOSE 111* 02/26/2015 0317   BUN 22* 02/26/2015 0317   CREATININE 1.10 02/26/2015 0317   CALCIUM 9.2 02/26/2015 0317   GFRNONAA >60 02/26/2015 0317   GFRAA >60 02/26/2015 0317    INR    Component Value Date/Time   INR 1.23 02/21/2015 1554     Intake/Output Summary (Last 24 hours) at 02/27/15 0747 Last data filed at 02/26/15 2300  Gross per 24 hour  Intake   1480 ml  Output    400 ml  Net   1080 ml     Assessment:  74 y.o. male is s/p:  Right femoral to below knee popliteal bypass with non-reversed ipsilateral great saphenous vein, right common femoral endarterectomy with profundaplasty (vein patch)  2 Days Post-Op  Plan: -pt doing well this am with palpable right DP pulse -states he has walked in the halls with a a walker and will have a nurse with him at home -needs 3n1 toilet -I did order HH PT for pt -will d/c him home later this morning -discussed groin wound care with him. -pt is on a statin.  He is not on aspirin-will d/w Dr. Bynum Bellows, PA-C [facility] [phone] 02/27/2015 7:47 AM   1+ Right DP pulse, some swelling of leg, incisions overall clean Pt wants to go to his home.  He has set up day and evening nurse.  He is ambulating some although sore. Need to d/c on aspirin Needs follow up in 2 weeks Plan for d/c late this afternoon   Fabienne Bruns, MD [facility] [place] Office: [phone] Pager: [phone]

## 2015-02-27 NOTE — Care Management Note (Addendum)
Case Management Note  Patient Details  Name: Kevin Reilly MRN: [id number] Date of Birth: 02-04-1941  Subjective/Objective:         Right femoral to below knee popliteal bypass            Action/Plan: NCM spoke to pt and wanted NCM to speak to niece, Victorino Dike Hippert # [phone]. States he does want RW for home but not bedside commode. Contacted niece, Victorino Dike. Offered choice for [facility]. States she prefers [facility]. States she has friend's that she plans to private pay to stay with him at night and some during the day. Contacted [facility] for HHPT. Message left for attending for orders for HHPT and F2F. Contacted [facility] DME rep for RW for home.   Expected Discharge Date:  02/28/2015               Expected Discharge Plan:  Home w Home Health Services  In-House Referral:     Discharge planning Services  CM Consult  Patient  DME Arranged:  Walker rolling, 3:1 DME Agency:  [facility]  HH Arranged:  PT,OT HH Agency:  [facility]  Status of Service:  Completed, signed off  Medicare Important Message Given:    Date Medicare IM Given:    Medicare IM give by:    Date Additional Medicare IM Given:    Additional Medicare Important Message give by:     If discussed at Long Length of Stay Meetings, dates discussed:    Additional Comments: CM assessed pt.  CM asked bedside nurse to obtain [facility] OT order from MD if deemed necessary prior to discharge.  Agency informed that pt is discharging today.  Pt refused SNF as recommended and stated he would have supervision as recommended.  CM spoke with niece Victorino Dike, arrangements have been made for 24 hour supervison Cherylann Parr, RN 02/27/2015, 10:00 AM

## 2015-02-27 NOTE — Discharge Summary (Signed)
Discharge Summary     Kevin Reilly 1940-08-16 74 y.o. male  [id number]  Admission Date: 02/25/2015  Discharge Date: 02/27/15  Physician: Sherren Kerns, MD  Admission Diagnosis: Peripheral arterial disease I70.92   HPI:   This is a 74 y.o. male who presents for evaluation of a chronic painful right fifth metatarsal ulcer. The patient states he has had this ulcer for several years. It was initially followed by a podiatrist. They told him he may have osteomyelitis. At that point he wished to see Dr. Lajoyce Corners for second opinion. Dr. Lajoyce Corners recently saw him in the office and referred him to [place] due to concerns over decreased perfusion in the right leg. The patient currently controls the pain in his right foot with a Lidoderm patch. He has pain right over the right fifth metatarsal. He does not really complain of drainage from this. He actively works daily at [employer]. He denies claudication symptoms. He denies prior myocardial infarction or stroke. He does have a history of hypertension which is controlled. He is a former tobacco abuser but quit 30 years ago. He also has chronic lower extremity edema. 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   By POD 1, he was doing well and transferred to the telemetry floor.  Intraoperatively, he had episodes of bradycardia with an apparent junctional rhythm with a rate in the 30's-40's.  BP remained stable.  A 12 lead EKG was negative for ST-T changes and showed sinus rhythm with rate of 65.  He had brisk doppler flow right foot.  Pt is discharged home on POD 2 and a  day and evening nurse is set up.  At the time this discharge summary is completed, the pt was not sent home on an aspirin.  The pt will be called and advised to start a daily aspirin daily.  The remainder of the hospital course consisted of increasing mobilization and increasing intake of solids without difficulty.  CBC    Component Value Date/Time   WBC 9.2 02/26/2015 0317   RBC 3.38* 02/26/2015 0317   HGB 10.7* 02/26/2015 0317   HCT 33.3* 02/26/2015 0317   PLT 192 02/26/2015 0317   MCV 98.5 02/26/2015 0317   MCH 31.7 02/26/2015 0317   MCHC 32.1 02/26/2015 0317   RDW 13.8 02/26/2015 0317   LYMPHSABS 0.8 07/02/2013 1430   MONOABS 0.7 07/02/2013 1430   EOSABS 0.0 07/02/2013 1430   BASOSABS 0.0 07/02/2013 1430    BMET    Component Value Date/Time   NA 141 02/26/2015 0317   K 4.4 02/26/2015 0317   CL 111 02/26/2015 0317   CO2 21* 02/26/2015 0317   GLUCOSE 111* 02/26/2015 0317   BUN 22* 02/26/2015 0317   CREATININE 1.10 02/26/2015 0317   CALCIUM 9.2 02/26/2015 0317   GFRNONAA >60 02/26/2015 0317   GFRAA >60 02/26/2015 0317     Discharge Instructions    Call MD for:  redness, tenderness, or signs of infection (pain, swelling, bleeding, redness, odor or green/yellow discharge around incision site)    Complete by:  As directed      Call  MD for:  severe or increased pain, loss or decreased feeling  in affected limb(s)    Complete by:  As directed      Call MD for:  temperature >100.5    Complete by:  As directed      Discharge wound care:    Complete by:  As directed   Wash the groin wound with soap and water daily and pat dry. (No tub bath-only shower)  Then put a dry gauze or washcloth there to keep this area dry daily and as needed.  Do not use Vaseline or neosporin on your incisions.  Only use soap and water on your incisions and then protect and keep dry.     Driving Restrictions    Complete by:  As directed   No driving for 2 weeks     Lifting restrictions    Complete  by:  As directed   No lifting for 4 weeks     Resume previous diet    Complete by:  As directed            Discharge Diagnosis:  Peripheral arterial disease I70.92  Secondary Diagnosis: Patient Active Problem List   Diagnosis Date Noted  . PAD (peripheral artery disease) (HCC) 02/25/2015  . Bilateral carotid artery disease (HCC) 02/05/2015  . Essential hypertension 01/01/2014  . Critical lower limb ischemia 01/01/2014  . Bilateral lower extremity edema 01/01/2014   Past Medical History  Diagnosis Date  . Critical lower limb ischemia   . Peripheral arterial disease (HCC)   . Bilateral carotid artery disease (HCC)   . Hypertension     takes Amlodipine and Ramipril daily  . Hyperlipidemia     takes Atorvastatin daily  . Joint swelling   . Nocturia        Medication List    TAKE these medications        acetaminophen 500 MG tablet  Commonly known as:  TYLENOL  Take 1,500-2,000 mg by mouth daily as needed (pain).     amLODipine 5 MG tablet  Commonly known as:  NORVASC  Take 1 tablet (5 mg total) by mouth daily.     atorvastatin 20 MG tablet  Commonly known as:  LIPITOR  Take 1 tablet (20 mg total) by mouth daily.     Co Q10 100 MG Caps  Take 200 mg by mouth daily.     hydrochlorothiazide 12.5 MG capsule  Commonly known as:  MICROZIDE  take 1 capsule by mouth once daily     HYDROcodone-acetaminophen 10-325 MG tablet  Commonly known as:  NORCO  Take 1 tablet by mouth every 8 (eight) hours as needed.     oxyCODONE 5 MG immediate release tablet  Commonly known as:  ROXICODONE  Take 1 tablet (5 mg total) by mouth every 6 (six) hours as needed.     ramipril 10 MG capsule  Commonly known as:  ALTACE  Take 10 mg by mouth at bedtime.        Prescriptions given: 1.  Roxicodone #30 No Refill  Instructions: 1.  Wash the groin wound with soap and water daily and pat dry. (No tub bath-only shower)  Then put a dry gauze or washcloth there to keep this area  dry daily and as needed.  Do not use Vaseline or neosporin on your incisions.  Only use soap and water on your incisions and then protect and keep dry.  Disposition: home  Patient's condition: is Good  Follow up: 1.  Dr. [name] in 2 weeks   Doreatha Massed, PA-C [facility] [phone] 02/27/2015  7:53 AM  - For VQI Registry use --- Instructions: Press F2 to tab through selections.  Delete question if not applicable.   Post-op:  Wound infection: No  Graft infection: No  Transfusion: No  If yes, n/a units given New Arrhythmia: No Ipsilateral amputation: No,  Minor,  BKA,  AKA Discharge patency: [x ] Primary,  Primary assisted,  Secondary,  Occluded Patency judged by:  Dopper only,  Palpable graft pulse,  Palpable distal pulse,  ABI inc. > 0.15,  Duplex Discharge ABI: R not done, L not done D/C Ambulatory Status: Ambulatory  Complications: MI: No,  Troponin only,  EKG or Clinical CHF: No Resp failure:No,  Pneumonia,  Ventilator Chg in renal function: No,  Inc. Cr > 0.5,  Temp. Dialysis,  Permanent dialysis Stroke: No,  Minor,  Major Return to OR: No  Reason for return to OR:  Bleeding,  Infection,  Thrombosis,  Revision  Discharge medications: Statin use:  yes ASA use:  no Plavix use:  no Beta blocker use: no ACEI use:   no ARB use:  no Coumadin use: no

## 2015-02-28 ENCOUNTER — Telehealth: Payer: Self-pay

## 2015-02-28 DIAGNOSIS — I10 Essential (primary) hypertension: Secondary | ICD-10-CM

## 2015-02-28 MED ORDER — RAMIPRIL 10 MG PO CAPS: 10.0000 mg | ORAL_CAPSULE | Freq: Every day | ORAL | Status: DC

## 2015-02-28 NOTE — Telephone Encounter (Signed)
Pt. called to request a refill on his Ramipril.  Reported he didn't realize when discharged from hospital, that he was down to 1 pill.  Stated his PCP is out of the office.  Advised will refill this medication, and he should contact his PCP for further refills on it.  Verb. Understanding.

## 2015-03-03 ENCOUNTER — Telehealth: Payer: Self-pay | Admitting: Vascular Surgery

## 2015-03-03 NOTE — Telephone Encounter (Deleted)
-----   Message from Phillips Odor, RN sent at 02/27/2015 10:08 AM EST ----- Regarding: Joyce Gross log; also needs 2 week f/u with CEF with ABI's FYI:  It will be easier to sched. the ABI's, and then cancel if they get done prior to discharge, than not to sched. it.    ----- Message -----    From: Dara Lords, PA-C    Sent: 02/27/2015   7:54 AM      To: [facility] Charge Pool  S/p right fem pop 02/25/15. F/u with Dr. [name] in 2 weeks.  If he doesn't have ABI's when he leaves, he will need them.  Thanks, Lelon Mast

## 2015-03-03 NOTE — Telephone Encounter (Addendum)
-----   Message from Phillips Odor, RN sent at 02/27/2015 10:08 AM EST ----- Regarding: Joyce Gross log; also needs 2 week f/u with CEF with ABI's FYI:  It will be easier to sched. the ABI's, and then cancel if they get done prior to discharge, than not to sched. it.    ----- Message -----    From: Dara Lords, PA-C    Sent: 02/27/2015   7:54 AM      To: [facility] Charge Pool  S/p right fem pop 02/25/15. F/u with Dr. [name] in 2 weeks.  If he doesn't have ABI's when he leaves, he will need them.  Thanks, Samantha  notified patient of post op appt. on 03-19-15 at 9:30 for lab and 10:00 with dr. [name]

## 2015-03-04 ENCOUNTER — Telehealth: Payer: Self-pay

## 2015-03-04 NOTE — Telephone Encounter (Signed)
-----   Message from Dara Lords, [place] sent at 03/02/2015  8:51 AM EST ----- I am doing his d/c summary and I was in the OR when he discharged.  He needs to be on a daily aspirin.  Can you call him and let him know to take a daily 81mg  aspirin.    Thanks [name]!  Lelon Mast

## 2015-03-04 NOTE — Telephone Encounter (Signed)
Rec'd phone call back from [place], Armed forces technical officer.  Advised of order for ASA 81 mg qd.  Verb. Understanding.  Stated that the nursing is provided by Private duty nurses, and that she would be sure the ASA order is carried out.

## 2015-03-04 NOTE — Telephone Encounter (Signed)
Phone call to pt.  Advised of recommendation by Dr. [name]' PA to start ASA 81 mg, qd.  The patient requested that I speak to his CMA; spoke with Narda Rutherford, CMA.  Advised of new order for ASA.  Verb. Understanding.  Was given the nursing supervisor's phone number to send an order.  Attempted to contact, "Jasmine", nursing supervisor, @ [phone].  Left voice message to call [facility] re: new order on a current patient.

## 2015-03-05 ENCOUNTER — Emergency Department (EMERGENCY_DEPARTMENT_HOSPITAL): Payer: Medicare Other

## 2015-03-05 ENCOUNTER — Encounter (HOSPITAL_COMMUNITY): Payer: Self-pay | Admitting: Emergency Medicine

## 2015-03-05 ENCOUNTER — Emergency Department (HOSPITAL_COMMUNITY)
Admission: EM | Admit: 2015-03-05 | Discharge: 2015-03-05 | Disposition: A | Discharge status: Home / Self Care | Payer: Medicare Other | Attending: Emergency Medicine | Admitting: Emergency Medicine

## 2015-03-05 ENCOUNTER — Telehealth: Payer: Self-pay

## 2015-03-05 DIAGNOSIS — M79604 Pain in right leg: Secondary | ICD-10-CM

## 2015-03-05 DIAGNOSIS — M79609 Pain in unspecified limb: Secondary | ICD-10-CM

## 2015-03-05 DIAGNOSIS — R1031 Right lower quadrant pain: Secondary | ICD-10-CM | POA: Diagnosis present

## 2015-03-05 DIAGNOSIS — L03115 Cellulitis of right lower limb: Secondary | ICD-10-CM

## 2015-03-05 DIAGNOSIS — Z79899 Other long term (current) drug therapy: Secondary | ICD-10-CM | POA: Diagnosis not present

## 2015-03-05 DIAGNOSIS — R2241 Localized swelling, mass and lump, right lower limb: Secondary | ICD-10-CM | POA: Diagnosis not present

## 2015-03-05 DIAGNOSIS — Z9889 Other specified postprocedural states: Secondary | ICD-10-CM | POA: Diagnosis not present

## 2015-03-05 DIAGNOSIS — I872 Venous insufficiency (chronic) (peripheral): Secondary | ICD-10-CM | POA: Diagnosis not present

## 2015-03-05 DIAGNOSIS — Z7982 Long term (current) use of aspirin: Secondary | ICD-10-CM | POA: Insufficient documentation

## 2015-03-05 DIAGNOSIS — E785 Hyperlipidemia, unspecified: Secondary | ICD-10-CM | POA: Diagnosis not present

## 2015-03-05 DIAGNOSIS — Z87891 Personal history of nicotine dependence: Secondary | ICD-10-CM | POA: Diagnosis not present

## 2015-03-05 DIAGNOSIS — G8918 Other acute postprocedural pain: Secondary | ICD-10-CM | POA: Insufficient documentation

## 2015-03-05 DIAGNOSIS — I10 Essential (primary) hypertension: Secondary | ICD-10-CM | POA: Insufficient documentation

## 2015-03-05 DIAGNOSIS — M7989 Other specified soft tissue disorders: Secondary | ICD-10-CM | POA: Diagnosis not present

## 2015-03-05 LAB — BASIC METABOLIC PANEL
Anion gap: 9 (ref 5–15)
BUN: 35 mg/dL — AB (ref 6–20)
CHLORIDE: 107 mmol/L (ref 101–111)
CO2: 25 mmol/L (ref 22–32)
CREATININE: 1 mg/dL (ref 0.61–1.24)
Calcium: 9.3 mg/dL (ref 8.9–10.3)
GFR calc Af Amer: >60 mL/min (ref >60)
GFR calc non Af Amer: >60 mL/min (ref >60)
GLUCOSE: 115 mg/dL — AB (ref 65–99)
POTASSIUM: 4.8 mmol/L (ref 3.5–5.1)
SODIUM: 141 mmol/L (ref 135–145)

## 2015-03-05 LAB — CBC WITH DIFFERENTIAL/PLATELET
Basophils Absolute: 0 10*3/uL (ref 0.0–0.1)
Basophils Relative: 1 %
EOS PCT: 4 %
Eosinophils Absolute: 0.4 10*3/uL (ref 0.0–0.7)
HCT: 31.2 % — ABNORMAL LOW (ref 39.0–52.0)
HEMOGLOBIN: 10 g/dL — AB (ref 13.0–17.0)
LYMPHS ABS: 1.9 10*3/uL (ref 0.7–4.0)
LYMPHS PCT: 23 %
MCH: 31.7 pg (ref 26.0–34.0)
MCHC: 32.1 g/dL (ref 30.0–36.0)
MCV: 99 fL (ref 78.0–100.0)
MONOS PCT: 9 %
Monocytes Absolute: 0.7 10*3/uL (ref 0.1–1.0)
Neutro Abs: 5.3 10*3/uL (ref 1.7–7.7)
Neutrophils Relative %: 63 %
PLATELETS: 275 10*3/uL (ref 150–400)
RBC: 3.15 MIL/uL — AB (ref 4.22–5.81)
RDW: 13.5 % (ref 11.5–15.5)
WBC: 8.3 10*3/uL (ref 4.0–10.5)

## 2015-03-05 MED ORDER — OXYCODONE-ACETAMINOPHEN 5-325 MG PO TABS
1.0000 | ORAL_TABLET | Freq: Once | ORAL | Status: AC
  Administered 2015-03-05: 1 via ORAL
  Filled 2015-03-05: qty 1

## 2015-03-05 MED ORDER — CEPHALEXIN 500 MG PO CAPS
500.0000 mg | ORAL_CAPSULE | Freq: Once | ORAL | Status: AC
  Administered 2015-03-05: 500 mg via ORAL
  Filled 2015-03-05: qty 1

## 2015-03-05 MED ORDER — OXYCODONE HCL 5 MG PO TABS: 5.0000 mg | ORAL_TABLET | Freq: Four times a day (QID) | ORAL | Status: DC | PRN

## 2015-03-05 MED ORDER — CEPHALEXIN 500 MG PO CAPS: 500.0000 mg | ORAL_CAPSULE | Freq: Four times a day (QID) | ORAL | Status: DC

## 2015-03-05 NOTE — ED Notes (Signed)
Per pt, states he had vascular surgery on the 15th-now having right groin pain radiating down right leg

## 2015-03-05 NOTE — ED Provider Notes (Signed)
CSN: [id number]     Arrival date & time 03/05/15  1735 History   First MD Initiated Contact with Patient 03/05/15 1829     Chief Complaint  Patient presents with  . Groin Pain     (Consider location/radiation/quality/duration/timing/severity/associated sxs/prior Treatment) Patient is a 75 y.o. male presenting with groin pain. The history is provided by the patient.  Groin Pain  He is status post right femoral popliteal bypass surgery on November 15. Over the last 3 days, he has had pain in his right groin and swelling in his right lower leg. He currently rates pain at 8/10. Oxycodone has given slight relief of pain. He denies fever or chills. He denies dyspnea or chest pain. He denies cough. He called the office today and he was advised to come in to have it evaluated.  Past Medical History  Diagnosis Date  . Critical lower limb ischemia   . Peripheral arterial disease (HCC)   . Bilateral carotid artery disease (HCC)   . Hypertension     takes Amlodipine and Ramipril daily  . Hyperlipidemia     takes Atorvastatin daily  . Joint swelling   . Nocturia    Past Surgical History  Procedure Laterality Date  . Back surgery    . Peripheral vascular catheterization N/A 01/24/2015    Procedure: Abdominal Aortogram;  Surgeon: Sherren Kerns, MD;  Location: [facility] INVASIVE CV LAB;  Service: Cardiovascular;  Laterality: N/A;  . Colonosocpy    . Endarterectomy femoral Right 02/25/2015    Procedure: ENDARTERECTOMYRight  FEMORAL Endarterectomy with profundaplasty.;  Surgeon: Sherren Kerns, MD;  Location: [facility] OR;  Service: Vascular;  Laterality: Right;  . Femoral-popliteal bypass graft Right 02/25/2015    Procedure: BYPASS GRAFT FEMORAL-POPLITEAL ARTERY using non-reversed saphenous vein.;  Surgeon: Sherren Kerns, MD;  Location: [facility] OR;  Service: Vascular;  Laterality: Right;   No family history on file. Social History  Substance Use Topics  . Smoking status: Former Smoker    Types:  Cigarettes  . Smokeless tobacco: None  . Alcohol Use: No    Review of Systems  All other systems reviewed and are negative.     Allergies  Chocolate and Sulfur  Home Medications   Prior to Admission medications   Medication Sig Start Date End Date Taking? Authorizing Provider  acetaminophen (TYLENOL) 500 MG tablet Take 1,500-2,000 mg by mouth daily as needed (pain).    Yes Historical Provider, MD  amLODipine (NORVASC) 5 MG tablet Take 1 tablet (5 mg total) by mouth daily. Patient taking differently: Take 5 mg by mouth at bedtime.  02/05/15  Yes Runell Gess, MD  aspirin 81 MG chewable tablet Chew 81 mg by mouth daily.   Yes Historical Provider, MD  atorvastatin (LIPITOR) 20 MG tablet Take 1 tablet (20 mg total) by mouth daily. 02/13/15  Yes Runell Gess, MD  Coenzyme Q10 (CO Q10) 100 MG CAPS Take 200 mg by mouth daily. 02/13/15  Yes Runell Gess, MD  hydrochlorothiazide (MICROZIDE) 12.5 MG capsule take 1 capsule by mouth once daily 02/06/15  Yes Runell Gess, MD  HYDROcodone-acetaminophen [facility]) 10-325 MG tablet Take 1 tablet by mouth every 8 (eight) hours as needed. Patient taking differently: Take 1 tablet by mouth every 8 (eight) hours as needed for moderate pain.  01/23/15  Yes Sherren Kerns, MD  oxyCODONE (ROXICODONE) 5 MG immediate release tablet Take 1 tablet (5 mg total) by mouth every 6 (six) hours as needed. 02/27/15  Yes Samantha J Rhyne, PA-C  ramipril (ALTACE) 10 MG capsule Take 1 capsule (10 mg total) by mouth at bedtime. 02/28/15  Yes Sherren Kerns, MD   BP 139/70 mmHg  Pulse 51  Temp(Src) 97.9 F (36.6 C) (Oral)  Resp 18  SpO2 100% Physical Exam  Nursing note and vitals reviewed.  73 year old male, resting comfortably and in no acute distress. Vital signs are significant for bradycardia. Oxygen saturation is 100%, which is normal. Head is normocephalic and atraumatic. PERRLA, EOMI. Oropharynx is clear. Neck is nontender and supple without  adenopathy or JVD. Back is nontender and there is no CVA tenderness. Lungs are clear without rales, wheezes, or rhonchi. Chest is nontender. Heart has regular rate and rhythm without murmur. Abdomen is soft, flat, nontender without masses or hepatosplenomegaly and peristalsis is normoactive. Extremities: There is moderate swelling of the right lower leg compared with the left. There is also erythema and warmth. Moderate bilateral venous stasis changes are present. The right foot is warm with capillary refill of 4 seconds which is approximately symmetric with the left leg. Right foot is definitely warmer than the left. Dorsalis pedis pulses not definitely palpable. Incision along the medial aspect of the right lower leg and thigh appears to be healing well without signs of infection. There is only mild tenderness to palpation. There is an incision above the inguinal fold which is moderately indurated and significantly more tender than the other incisions. There is no erythema or drainage from this incision and picture seems most consistent with hematoma at the incision site. Skin is warm and dry without other rash. Neurologic: Mental status is normal, cranial nerves are intact, there are no motor or sensory deficits.  ED Course  Procedures (including critical care time) Labs Review Results for orders placed or performed during the hospital encounter of 03/05/15  Basic metabolic panel  Result Value Ref Range   Sodium 141 135 - 145 mmol/L   Potassium 4.8 3.5 - 5.1 mmol/L   Chloride 107 101 - 111 mmol/L   CO2 25 22 - 32 mmol/L   Glucose, Bld 115 (H) 65 - 99 mg/dL   BUN 35 (H) 6 - 20 mg/dL   Creatinine, Ser 1.61 0.61 - 1.24 mg/dL   Calcium 9.3 8.9 - 09.6 mg/dL   GFR calc non Af Amer >60 >60 mL/min   GFR calc Af Amer >60 >60 mL/min   Anion gap 9 5 - 15  CBC with Differential  Result Value Ref Range   WBC 8.3 4.0 - 10.5 K/uL   RBC 3.15 (L) 4.22 - 5.81 MIL/uL   Hemoglobin 10.0 (L) 13.0 - 17.0  g/dL   HCT 04.5 (L) 40.9 - 81.1 %   MCV 99.0 78.0 - 100.0 fL   MCH 31.7 26.0 - 34.0 pg   MCHC 32.1 30.0 - 36.0 g/dL   RDW 91.4 78.2 - 95.6 %   Platelets 275 150 - 400 K/uL   Neutrophils Relative % 63 %   Neutro Abs 5.3 1.7 - 7.7 K/uL   Lymphocytes Relative 23 %   Lymphs Abs 1.9 0.7 - 4.0 K/uL   Monocytes Relative 9 %   Monocytes Absolute 0.7 0.1 - 1.0 K/uL   Eosinophils Relative 4 %   Eosinophils Absolute 0.4 0.0 - 0.7 K/uL   Basophils Relative 1 %   Basophils Absolute 0.0 0.0 - 0.1 K/uL   I have personally reviewed and evaluated these images and lab results as part of my  medical decision-making.   MDM   Final diagnoses:  Pain and swelling of right lower extremity  Post-operative pain  Cellulitis of right lower leg    Right groin pain which seems to be consistent with hematoma at operative incision. No evidence of infection. Swelling, erythema, warmth of the right lower leg. He will be sent for vascular ultrasound rule out DVT. Because I cannot definitely feel a pulse, and also get arterial vascular studies. Findings could also be consistent with cellulitis. Old records were reviewed confirming recent right femoral-popliteal bypass.  Venous ultrasound shows no evidence of DVT. Also, arterial flow is good and there is no evidence of pseudoaneurysm. Patient will be treated for cellulitis even though WBC is normal and WC differential is normal. He is discharged with prescription for cephalexin. He has a follow-up appointment with his vascular surgeon scheduled for 1 week and is to keep that appointment. He states he is running low on his oxycodone tablets and is given a prescription for another 10 tablets.  Dione Booze, MD 03/05/15 [phone]

## 2015-03-05 NOTE — Telephone Encounter (Signed)
Phone call from pt's private duty nurse.  Reported pt. Has had increased swelling, redness, and warmth of right lower extremity up to the calf x 2 days.  Reported the pt. also c/o pain and swelling in the right groin.   Stated the pt. will not allow the CMA to check to right groin.  Reported the right leg incision "looks fine"; denied any redness in incisional area.  Denied fever/ chills.  Discussed pt's symptoms with Dr. Myra Gianotti.  Advised that pt. should go to the ER, since there is no provider in the office to evaluate him this afternoon.  Notified pt. and private duty nurse of recommendation to go to the ER, for further eval.  Verb. Understanding.

## 2015-03-05 NOTE — Progress Notes (Signed)
VASCULAR LAB PRELIMINARY  PRELIMINARY  PRELIMINARY  PRELIMINARY  Right lower extremity venous duplex completed.    Preliminary report:  Right:  No evidence of DVT, superficial thrombosis, or Baker's cyst.  Meghana Tullo, RVT 03/05/2015, 7:37 PM

## 2015-03-05 NOTE — Discharge Instructions (Signed)
Keep your scheduled appointment with Dr. [name]. Return if you start running a fever, or if pain is getting worse.   Cellulitis Cellulitis is an infection of the skin and the tissue beneath it. The infected area is usually red and tender. Cellulitis occurs most often in the arms and lower legs.  CAUSES  Cellulitis is caused by bacteria that enter the skin through cracks or cuts in the skin. The most common types of bacteria that cause cellulitis are staphylococci and streptococci. SIGNS AND SYMPTOMS   Redness and warmth.  Swelling.  Tenderness or pain.  Fever. DIAGNOSIS  Your health care provider can usually determine what is wrong based on a physical exam. Blood tests may also be done. TREATMENT  Treatment usually involves taking an antibiotic medicine. HOME CARE INSTRUCTIONS   Take your antibiotic medicine as directed by your health care provider. Finish the antibiotic even if you start to feel better.  Keep the infected arm or leg elevated to reduce swelling.  Apply a warm cloth to the affected area up to 4 times per day to relieve pain.  Take medicines only as directed by your health care provider.  Keep all follow-up visits as directed by your health care provider. SEEK MEDICAL CARE IF:   You notice red streaks coming from the infected area.  Your red area gets larger or turns dark in color.  Your bone or joint underneath the infected area becomes painful after the skin has healed.  Your infection returns in the same area or another area.  You notice a swollen bump in the infected area.  You develop new symptoms.  You have a fever. SEEK IMMEDIATE MEDICAL CARE IF:   You feel very sleepy.  You develop vomiting or diarrhea.  You have a general ill feeling (malaise) with muscle aches and pains.   This information is not intended to replace advice given to you by your health care provider. Make sure you discuss any questions you have with your health care  provider.   Document Released: 01/06/2005 Document Revised: 12/18/2014 Document Reviewed: 06/14/2011 Elsevier Interactive Patient Education 2016 Elsevier Inc.  Cephalexin tablets or capsules What is this medicine? CEPHALEXIN (sef a LEX in) is a cephalosporin antibiotic. It is used to treat certain kinds of bacterial infections It will not work for colds, flu, or other viral infections. This medicine may be used for other purposes; ask your health care provider or pharmacist if you have questions. What should I tell my health care provider before I take this medicine? They need to know if you have any of these conditions: -kidney disease -stomach or intestine problems, especially colitis -an unusual or allergic reaction to cephalexin, other cephalosporins, penicillins, other antibiotics, medicines, foods, dyes or preservatives -pregnant or trying to get pregnant -breast-feeding How should I use this medicine? Take this medicine by mouth with a full glass of water. Follow the directions on the prescription label. This medicine can be taken with or without food. Take your medicine at regular intervals. Do not take your medicine more often than directed. Take all of your medicine as directed even if you think you are better. Do not skip doses or stop your medicine early. Talk to your pediatrician regarding the use of this medicine in children. While this drug may be prescribed for selected conditions, precautions do apply. Overdosage: If you think you have taken too much of this medicine contact a poison control center or emergency room at once. NOTE: This medicine is only  for you. Do not share this medicine with others. What if I miss a dose? If you miss a dose, take it as soon as you can. If it is almost time for your next dose, take only that dose. Do not take double or extra doses. There should be at least 4 to 6 hours between doses. What may interact with this medicine? -probenecid -some  other antibiotics This list may not describe all possible interactions. Give your health care provider a list of all the medicines, herbs, non-prescription drugs, or dietary supplements you use. Also tell them if you smoke, drink alcohol, or use illegal drugs. Some items may interact with your medicine. What should I watch for while using this medicine? Tell your doctor or health care professional if your symptoms do not begin to improve in a few days. Do not treat diarrhea with over the counter products. Contact your doctor if you have diarrhea that lasts more than 2 days or if it is severe and watery. If you have diabetes, you may get a false-positive result for sugar in your urine. Check with your doctor or health care professional. What side effects may I notice from receiving this medicine? Side effects that you should report to your doctor or health care professional as soon as possible: -allergic reactions like skin rash, itching or hives, swelling of the face, lips, or tongue -breathing problems -pain or trouble passing urine -redness, blistering, peeling or loosening of the skin, including inside the mouth -severe or watery diarrhea -unusually weak or tired -yellowing of the eyes, skin Side effects that usually do not require medical attention (report to your doctor or health care professional if they continue or are bothersome): -gas or heartburn -genital or anal irritation -headache -joint or muscle pain -nausea, vomiting This list may not describe all possible side effects. Call your doctor for medical advice about side effects. You may report side effects to FDA at 1-800-FDA-1088. Where should I keep my medicine? Keep out of the reach of children. Store at room temperature between 59 and 86 degrees F (15 and 30 degrees C). Throw away any unused medicine after the expiration date. NOTE: This sheet is a summary. It may not cover all possible information. If you have questions  about this medicine, talk to your doctor, pharmacist, or health care provider.    2016, Elsevier/Gold Standard. (2007-07-03 17:09:13)

## 2015-03-14 ENCOUNTER — Encounter: Payer: Self-pay | Admitting: Vascular Surgery

## 2015-03-17 ENCOUNTER — Telehealth: Payer: Self-pay

## 2015-03-17 DIAGNOSIS — G8918 Other acute postprocedural pain: Secondary | ICD-10-CM

## 2015-03-17 MED ORDER — OXYCODONE HCL 5 MG PO TABS: 5.0000 mg | ORAL_TABLET | Freq: Four times a day (QID) | ORAL | Status: DC | PRN

## 2015-03-17 NOTE — Telephone Encounter (Signed)
Pt. Called to request refill on pain medication.  Reported pain is @ 7/10, when it is at the worst.  Stated he is taking the pain medication about q 6 hrs.  Reported the pain is in the right leg incisions.  Denied any increased redness, or drainage.  Stated there is swelling from foot to above knee in right LE, that improves with elevation.  Denied any fever/ chills.  Stated the right groin swelling has improved and feels better.  Discussed with Dr. Myra Gianotti.  Authorized to refill Oxycodone IR 5 mg tab; # 30; no refills.  Pt. Notified of Rx to be picked up at the front desk.

## 2015-03-18 ENCOUNTER — Telehealth: Payer: Self-pay

## 2015-03-18 NOTE — Telephone Encounter (Signed)
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 He is demanding an afternoon appointment. The next available pm appointment for a lab and to see [facility] is Jan 12. The daughter said that he has a wound infection and has to be seen before then. I explained that with the type of appointment he needs, I do not have an afternoon until January. 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  FYI- we ended keeping the appointment as is, but she is insist that he will not come, but didn't want to reschedule.   Annabelle Harman

## 2015-03-18 NOTE — Telephone Encounter (Signed)
Call placed to pt. to discuss importance of keeping appt. 03/19/15, as daughter said that the pt. Had a wound infection.  Pt. 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 That we cannot give him pain medication refills, if he is not going to keep his f/u appt.  The pt. stated "okay, then tear it up."  Pt. hung up the phone.

## 2015-03-19 ENCOUNTER — Encounter (HOSPITAL_COMMUNITY): Payer: Medicare Other

## 2015-03-19 ENCOUNTER — Ambulatory Visit: Payer: Medicare Other | Admitting: Vascular Surgery

## 2015-03-19 NOTE — Telephone Encounter (Signed)
When Juliette Alcide called to remind Kevin Reilly of his appointment he canceled and rescheduled it for 04/28/2014. FYI

## 2015-04-23 ENCOUNTER — Ambulatory Visit (INDEPENDENT_AMBULATORY_CARE_PROVIDER_SITE_OTHER): Payer: Medicare Other | Admitting: Vascular Surgery

## 2015-04-23 ENCOUNTER — Ambulatory Visit (HOSPITAL_COMMUNITY)
Admission: RE | Admit: 2015-04-23 | Discharge: 2015-04-23 | Disposition: A | Discharge status: Home / Self Care | Payer: Medicare Other | Source: Ambulatory Visit | Attending: Vascular Surgery | Admitting: Vascular Surgery

## 2015-04-23 VITALS — BP 197/71 | HR 50 | Temp 97.0°F | Resp 16 | Ht 67.0 in | Wt 174.0 lb

## 2015-04-23 DIAGNOSIS — Z95828 Presence of other vascular implants and grafts: Secondary | ICD-10-CM | POA: Insufficient documentation

## 2015-04-23 DIAGNOSIS — I739 Peripheral vascular disease, unspecified: Secondary | ICD-10-CM | POA: Diagnosis present

## 2015-04-23 NOTE — Progress Notes (Signed)
Patient is a 75 year old male who returns for postoperative follow-up today. He underwent right femoral to below-knee popliteal bypass with non-reversed saphenous vein right common femoral endarterectomy and profundoplasty also with a vein patch. He has some occasional intermittent swelling in his right leg which is improved with elevation at the end of the day. He is back to work full time. He does have a bulge at his right below-knee incision that was recently evaluated in the emergency room. He did not have fever or chills or elevated white count with was placed on several days worth of Keflex. He states his claudication symptoms in the right leg have improved. He denies any symptoms in the left leg.  Physical exam:  Filed Vitals:   04/23/15 1549 04/23/15 1552  BP: 200/69 197/71  Pulse: 50 50  Temp: 97 F (36.1 C)   Resp: 16   Height: 5\' 7"  (1.702 m)   Weight: 174 lb (78.926 kg)   SpO2: 100%     Right lower extremity: Well-healed incisions right leg below-knee incision is intact but there is a 4 x 3 cm fluctuant area in the superficial tissues which is easily palpable. There is no surrounding erythema or drainage. He has a 2+ popliteal pulse but absent pedal pulses in the right foot. The foot is warm and well-perfused.  Data: Patient had bilateral ABIs performed today. ABI on the right side has improved from 0.3 preop 2.81 postop left ABI is 0.67. Patient had triphasic flow in the right leg monophasic flow in the left leg  Assessment: Patent right femoral to below-knee popliteal bypass most likely a seroma or small hematoma at the below-knee popliteal incision. This is not really bothersome to the patient currently. I discussed with him that if this is causing pain or growing over time we could consider draining this. He prefers to observe it for now. I did discuss with him wearing a compression stocking on his right lower extremity especially on work base to prevent swelling in his right leg.  I also discussed with him that his blood pressure was significantly elevated today. He states he forgot to take his blood pressure medication today. He will work on trying to be more compliant with this.  Plan: Patient will follow-up in 3 months with graft duplex of the right leg and bilateral ABIs. He will wear compression stocking in the meanwhile. We will also try to be more compliant with his blood pressure medication.  Fabienne Bruns, MD [facility] Office: [phone] Pager: [phone]

## 2015-04-23 NOTE — Progress Notes (Signed)
Filed Vitals:   04/23/15 1549 04/23/15 1552  BP: 200/69 197/71  Pulse: 50 50  Temp: 97 F (36.1 C)   Resp: 16   Height: 5\' 7"  (1.702 m)   Weight: 174 lb (78.926 kg)   SpO2: 100%

## 2015-04-24 ENCOUNTER — Encounter: Payer: Self-pay | Admitting: Vascular Surgery

## 2015-04-24 NOTE — Addendum Note (Signed)
Addended by: Adria Dill L on: 04/24/2015 11:16 AM   Modules accepted: Orders

## 2015-05-01 ENCOUNTER — Encounter (HOSPITAL_COMMUNITY): Payer: Medicare Other

## 2015-05-01 ENCOUNTER — Ambulatory Visit: Payer: Medicare Other | Admitting: Vascular Surgery

## 2015-05-09 ENCOUNTER — Ambulatory Visit: Payer: Medicare Other | Admitting: Pharmacist Clinician (PhC)/ Clinical Pharmacy Specialist

## 2015-05-12 ENCOUNTER — Telehealth: Payer: Self-pay

## 2015-05-12 ENCOUNTER — Ambulatory Visit (INDEPENDENT_AMBULATORY_CARE_PROVIDER_SITE_OTHER): Payer: Medicare Other | Admitting: Family

## 2015-05-12 ENCOUNTER — Encounter: Payer: Self-pay | Admitting: Family

## 2015-05-12 VITALS — BP 173/68 | HR 55 | Temp 98.2°F | Ht 67.0 in | Wt 174.0 lb

## 2015-05-12 DIAGNOSIS — T148 Other injury of unspecified body region: Secondary | ICD-10-CM

## 2015-05-12 DIAGNOSIS — IMO0002 Reserved for concepts with insufficient information to code with codable children: Secondary | ICD-10-CM

## 2015-05-12 DIAGNOSIS — L03115 Cellulitis of right lower limb: Secondary | ICD-10-CM

## 2015-05-12 DIAGNOSIS — Z95828 Presence of other vascular implants and grafts: Secondary | ICD-10-CM

## 2015-05-12 DIAGNOSIS — I739 Peripheral vascular disease, unspecified: Secondary | ICD-10-CM

## 2015-05-12 MED ORDER — LEVOFLOXACIN 500 MG PO TABS: 500.0000 mg | ORAL_TABLET | Freq: Every day | ORAL | Status: DC

## 2015-05-12 NOTE — Telephone Encounter (Signed)
Call rec'd from pt's niece.  Reported pt. C/o increased pain in the right lower leg incision; noted "softball-sized raised area" with appearance of "pus" draining from site.  Requested an appt.  Appt. Given for 2:45 PM today with nurse practitioner.  Agreed with plan.

## 2015-05-12 NOTE — Progress Notes (Signed)
Postoperative Visit   History of Present Illness  Kevin Reilly is a 75 y.o. year old male patient of Dr. [name] who is s/p right femoral to below-knee popliteal bypass with non-reversed saphenous vein right common femoral endarterectomy and profundoplasty also with a vein patch on 02/25/15.  Dr. [name] last saw pt on 1/11/7. At that time pt had some occasional intermittent swelling in his right leg which is improved with elevation at the end of the day. He was back to work full time. He did have a bulge at his right below-knee incision that was recently evaluated in the emergency room. He did not have fever or chills or elevated white count with was placed on several days worth of Keflex. He states his claudication symptoms in the right leg have improved. He denied any symptoms in the left leg. Right ABI had increased from 30% preop to 80% post op. Patent right femoral to below-knee popliteal bypass most likely a seroma or small hematoma at the below-knee popliteal incision. This was not really bothersome to the patient at that time. Dr. [name] discussed with pt that if this is causing pain or growing over time we could consider draining this. Pt prefered to observe it at that time.   The pt returns today with c/o 2+ weeks hx of right leg becoming increasingly red and swollen, tight swollen lump at medial right leg incision, knee level. Pt denies fever or chills. He bumped his left shin over a week ago, sustained an abrasion, and now has a large area of erythema surrounding this abrasion.   03/05/15 ED visit: Right groin pain which seems to be consistent with hematoma at operative incision. No evidence of infection. Swelling, erythema, warmth of the right lower leg. He will be sent for vascular ultrasound rule out DVT. Because I cannot definitely feel a pulse, and also get arterial vascular studies. Findings could also be consistent with cellulitis. Old records were reviewed confirming recent  right femoral-popliteal bypass. Venous ultrasound shows no evidence of DVT. Also, arterial flow is good and there is no evidence of pseudoaneurysm. Patient will be treated for cellulitis even though WBC is normal and WC differential is normal. He is discharged with prescription for cephalexin. He has a follow-up appointment with his vascular surgeon scheduled for 1 week and is to keep that appointment. He states he is running low on his oxycodone tablets and is given a prescription for another 10 tablets.   The patient is able to complete their activities of daily living.     For VQI Use Only  PRE-ADM LIVING: Home  AMB STATUS: Ambulatory  Physical Examination  Filed Vitals:   05/12/15 1426 05/12/15 1431  BP: 185/62 173/68  Pulse: 55   Temp: 98.2 F (36.8 C)   TempSrc: Oral   Height: 5\' 7"  (1.702 m)   Weight: 174 lb (78.926 kg)   SpO2: 99%    Body mass index is 27.25 kg/(m^2).  Right lower leg with cellulitis, erythema, swelling, seroma at right popliteal medial incision. Left lower leg with shallow abrasion at left shin, large area of surrounding erythema.  Dr. Myra Gianotti used a sterile cotton swab( opposite end) to make an opening for a large amount of clear thin liquid to drain from the lump at the medial aspect incision at the popliteal level of pt's right leg. Wound packed with 4x4 gauze, several 4x4's and abd pad placed on this, Kerlex wrapped.  Triple antibx ointment placed on the abrasion  on his left anterior tibial area, 4x4 gauze on this, kerlex placed to hold in place.     Medical Decision Making  Kevin Reilly is a 74 y.o. year old male who is s/p right femoral to below-knee popliteal bypass with non-reversed saphenous vein right common femoral endarterectomy and profundoplasty also with a vein patch on 02/25/15. Large amount of clear fluid drained from seroma at right leg medial incision at knee level. Wound packed and dressed at above. Will arange for Home Health to  pack right medial incision opening daily with dry gauze or ribbon tape daily, adequate dressing on this to collect serous drainage. Cellulitis right leg: Levaquin 500 mg po daily x 2 weeks, disp #14, follow up with Dr. [name] in 1 week.   NICKEL, Carma Lair, RN, MSN, FNP-C [facility] Office: [phone]  05/12/2015, 2:57 PM  Clinic MD: Myra Gianotti

## 2015-05-13 ENCOUNTER — Other Ambulatory Visit: Payer: Self-pay

## 2015-05-14 NOTE — Addendum Note (Signed)
Addended by: Adria Dill L on: 05/14/2015 10:59 AM   Modules accepted: Orders

## 2015-05-15 ENCOUNTER — Other Ambulatory Visit: Payer: Self-pay

## 2015-05-15 DIAGNOSIS — S81801A Unspecified open wound, right lower leg, initial encounter: Secondary | ICD-10-CM

## 2015-05-15 DIAGNOSIS — IMO0002 Reserved for concepts with insufficient information to code with codable children: Secondary | ICD-10-CM

## 2015-05-15 DIAGNOSIS — L03115 Cellulitis of right lower limb: Secondary | ICD-10-CM

## 2015-05-16 ENCOUNTER — Encounter: Payer: Self-pay | Admitting: Vascular Surgery

## 2015-05-16 ENCOUNTER — Other Ambulatory Visit: Payer: Self-pay

## 2015-05-16 DIAGNOSIS — T814XXA Infection following a procedure, initial encounter: Principal | ICD-10-CM

## 2015-05-16 DIAGNOSIS — Z95828 Presence of other vascular implants and grafts: Secondary | ICD-10-CM

## 2015-05-16 DIAGNOSIS — IMO0001 Reserved for inherently not codable concepts without codable children: Secondary | ICD-10-CM

## 2015-05-21 ENCOUNTER — Ambulatory Visit (INDEPENDENT_AMBULATORY_CARE_PROVIDER_SITE_OTHER): Payer: Medicare Other | Admitting: Vascular Surgery

## 2015-05-21 VITALS — BP 175/63 | HR 49 | Temp 97.2°F | Resp 16 | Ht 69.0 in | Wt 178.0 lb

## 2015-05-21 DIAGNOSIS — G8918 Other acute postprocedural pain: Secondary | ICD-10-CM

## 2015-05-21 MED ORDER — OXYCODONE-ACETAMINOPHEN 5-325 MG PO TABS: 1.0000 | ORAL_TABLET | ORAL | Status: DC | PRN

## 2015-05-21 MED ORDER — OXYCODONE HCL 5 MG PO TABS: 5.0000 mg | ORAL_TABLET | Freq: Four times a day (QID) | ORAL | Status: DC | PRN

## 2015-05-21 NOTE — Progress Notes (Signed)
Filed Vitals:   05/21/15 1525 05/21/15 1527  BP: 188/65 175/63  Pulse: 49 49  Temp: 97.2 F (36.2 C)   Resp: 16   Height: 5\' 9"  (1.753 m)   Weight: 178 lb (80.74 kg)   SpO2: 100%

## 2015-05-22 ENCOUNTER — Encounter: Payer: Self-pay | Admitting: Vascular Surgery

## 2015-05-22 NOTE — Progress Notes (Signed)
Patient is a 75 year old male who returns for postoperative follow-up. He underwent a right femoral to below-knee popliteal bypass with non-reversed greater saphenous vein on 02/25/2015. He also had right common femoral endarterectomy with profundoplasty and a vein patch at that time. His right foot has been healing. He has no claudication symptoms. However, he has had drainage from his below-knee incision. A small incision was placed in this a few weeks ago by my partner Dr. Myra Gianotti. He has continued to drain clear fluid from this. It is on continuous in nature. He has no fever or chills. His groin incision and other thigh incisions are well-healed.  Physical exam:  Filed Vitals:   05/21/15 1525 05/21/15 1527  BP: 188/65 175/63  Pulse: 49 49  Temp: 97.2 F (36.2 C)   Resp: 16   Height: 5\' 9"  (1.753 m)   Weight: 178 lb (80.74 kg)   SpO2: 100%     Right lower external; all incisions healed in the right leg except for the below-knee incision. There is a 1/2 cm opening with clear fluid draining from this. I probed this with a Q-tip and revealed a large cavity within the below-knee incision. This was subsequently packed with dry Nu Gauze. About two thirds of a bottle of Nu Gauze filled the cavity. He has 2+ edema in both lower extremities. 2+ popliteal pulse right leg 1+ dorsalis pedis pulse  Assessment: Seroma versus lymphocele right below-knee incision. This is continuing to drain fluid. I do not believe this is infected. He has some irritation of the skin edge most likely from fluid. I do not believe he needs further antibiotics at this point.  Plan: We have set up home health wound care to do dry Nu Gauze in this cavity once daily. The patient will return for follow-up with me in 3 weeks. I also encouraged the patient to wear bilateral lower extremity compression stockings to help with the edema in both of his legs as some of this fluid coming from his below-knee incision probably is transudate  as well.   Fabienne Bruns, MD [facility] Office: [phone] Pager: [phone]

## 2015-05-23 ENCOUNTER — Telehealth: Payer: Self-pay | Admitting: *Deleted

## 2015-05-23 NOTE — Telephone Encounter (Signed)
Received a call from [place], [facility] nurse, regarding Kevin Reilly' need for nursing services. She states that this patient is not Homebound (he told her that he works every day) and as such, does not qualify for [facility] nursing. The patient adamantly stated to her that he has no family or friends who are able to pack and dress this wound.Will send Dr. [name] a staff message.

## 2015-05-23 NOTE — Telephone Encounter (Signed)
[facility] nurse Gwynn Burly [phone]

## 2015-05-26 NOTE — Telephone Encounter (Signed)
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 Dr. [name] has approved this patient to come to our office.

## 2015-06-02 NOTE — Anesthesia Postprocedure Evaluation (Signed)
Anesthesia Post Note  Patient: Kevin Reilly  Procedure(s) Performed: Procedure(s) (LRB): ENDARTERECTOMYRight  FEMORAL Endarterectomy with profundaplasty. (Right) BYPASS GRAFT FEMORAL-POPLITEAL ARTERY using non-reversed saphenous vein. (Right)  Patient location during evaluation: PACU Anesthesia Type: General Level of consciousness: awake, awake and alert and oriented Pain management: pain level controlled Vital Signs Assessment: post-procedure vital signs reviewed and stable Respiratory status: spontaneous breathing, nonlabored ventilation and respiratory function stable Anesthetic complications: no    Last Vitals:  Filed Vitals:   02/26/15 2017 02/27/15 0424  BP: 179/67 165/57  Pulse: 115 87  Temp: 37.6 C 37 C  Resp: 19 19    Last Pain:  Filed Vitals:   02/27/15 1053  PainSc: 0-No pain                 Alvine Mostafa COKER

## 2015-06-03 ENCOUNTER — Telehealth: Payer: Self-pay

## 2015-06-03 NOTE — Telephone Encounter (Signed)
Phone call from pt.  Reported increased redness of right lower leg, increased drainage from lower leg incision, and strong odor to drainage.  Denied fever/ chills.  Voiced concern of having an infection. Also questioned if he can get medication for pain.  Appt. given for right leg evaluation 06/04/15 @ 2:45 PM, with nurse practitioner.  Agrees with plan.

## 2015-06-03 NOTE — Telephone Encounter (Signed)
Scheduled pt for 2:45 on 06/04/15.

## 2015-06-04 ENCOUNTER — Ambulatory Visit (INDEPENDENT_AMBULATORY_CARE_PROVIDER_SITE_OTHER): Payer: Medicare Other | Admitting: Family

## 2015-06-04 ENCOUNTER — Encounter: Payer: Self-pay | Admitting: Family

## 2015-06-04 ENCOUNTER — Telehealth: Payer: Self-pay | Admitting: Family

## 2015-06-04 VITALS — BP 153/58 | HR 47 | Temp 98.0°F | Resp 14 | Ht 69.0 in | Wt 179.0 lb

## 2015-06-04 DIAGNOSIS — T148 Other injury of unspecified body region: Secondary | ICD-10-CM | POA: Diagnosis not present

## 2015-06-04 DIAGNOSIS — L03115 Cellulitis of right lower limb: Secondary | ICD-10-CM

## 2015-06-04 DIAGNOSIS — I739 Peripheral vascular disease, unspecified: Secondary | ICD-10-CM

## 2015-06-04 DIAGNOSIS — I872 Venous insufficiency (chronic) (peripheral): Secondary | ICD-10-CM

## 2015-06-04 DIAGNOSIS — I8311 Varicose veins of right lower extremity with inflammation: Secondary | ICD-10-CM

## 2015-06-04 DIAGNOSIS — G8918 Other acute postprocedural pain: Secondary | ICD-10-CM

## 2015-06-04 DIAGNOSIS — I8312 Varicose veins of left lower extremity with inflammation: Secondary | ICD-10-CM

## 2015-06-04 DIAGNOSIS — Z95828 Presence of other vascular implants and grafts: Secondary | ICD-10-CM

## 2015-06-04 DIAGNOSIS — IMO0002 Reserved for concepts with insufficient information to code with codable children: Secondary | ICD-10-CM

## 2015-06-04 MED ORDER — TRAMADOL HCL 50 MG PO TABS: 50.0000 mg | ORAL_TABLET | Freq: Four times a day (QID) | ORAL | Status: DC | PRN

## 2015-06-04 MED ORDER — CEPHALEXIN 500 MG PO CAPS: 500.0000 mg | ORAL_CAPSULE | Freq: Three times a day (TID) | ORAL | Status: DC

## 2015-06-04 NOTE — Progress Notes (Signed)
Filed Vitals:   06/04/15 1509 06/04/15 1518  BP: 162/61 153/58  Pulse: 49 47  Temp: 98 F (36.7 C)   TempSrc: Oral   Resp: 14   Height: 5\' 9"  (1.753 m)   Weight: 179 lb (81.194 kg)   SpO2: 100%

## 2015-06-04 NOTE — Telephone Encounter (Signed)
NP wanted pt to have a 1 w f/u w/ [facility] for 05/15/15. Because of the large amount of overbooks in the afternoon, we could only overbook him in the morning. The pt insisted that he could not come in the morning. He chose to not sch for 1 w and instead keep his 2 w f/u on 05/22/15.

## 2015-06-04 NOTE — Progress Notes (Signed)
[facility] [place] HISTORY AND PHYSICAL -PAD  History of Present Illness Kevin Reilly is a 75 y.o. male patient of Dr. [name] who is s/p right femoral to below-knee popliteal bypass with non-reversed greater saphenous vein on 02/25/2015. He also had right common femoral endarterectomy with profundoplasty and a vein patch at that time. His right foot has been healing. He has no claudication symptoms. However, he has had drainage from his below-knee incision. A small incision was placed in this a few weeks ago by Dr. Myra Gianotti. He has continued to drain clear fluid from this. It is on continuous in nature. He has no fever or chills. His groin incision and other thigh incisions are well-healed.  Dr. [name] last saw Kevin Reilly on 05/21/15, 2 weeks ago. At that time Dr. [name] determined Kevin Reilly had a seroma versus lymphocele at right below-knee incision. This was continuing to drain fluid. Dr. [name] did not believe this was infected. Kevin Reilly had some irritation of the skin edge most likely from fluid. Dr. [name] did not believe he needed further antibiotics at that point.  Plan at at that time was to set up home health wound care to do dry Nu Gauze in this cavity once daily. The patient was to return for follow-up with Dr. [name] in 3 weeks. Dr. [name] also encouraged the patient to wear bilateral lower extremity compression stockings to help with the edema in both of his legs as some of this fluid coming from his below-knee incision probably is transudate as well.  Kevin Reilly returns today to address communication from [facility] is that Kevin Reilly does not qualify for their services since he is not homebound and he works.every day. The Kevin Reilly states he has no family or friends to pack and dress wound. He is doing this daily, sometimes twice daily. He is removing the ribbon gauze, washing the right leg open incision with soap and warm water, packing the cavity with ribbon gauze using a sterile cotton swab, and applying a bandage to  absorb the drainage. The drainage from this wound is copious, cloudy, foul smelling, thin, with bits of pale yellow tissue. The right leg is red and swollen, Kevin Reilly states redness has been increasing. He has pain at the open incision. He states the pain medication he was prescribed recently caused constipation.  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   He denies claudication sx's with walking; states the swelling in his lower legs resolves by morning with elevation of his legs.   Kevin Reilly Diabetic: No Kevin Reilly smoker: former smoker  Kevin Reilly meds include: Statin :Yes Betablocker: No ASA: Yes Other anticoagulants/antiplatelets: no  Past Medical History  Diagnosis Date  . Critical lower limb ischemia   . Peripheral arterial disease (HCC)   . Bilateral carotid artery disease (HCC)   . Hypertension     takes Amlodipine and Ramipril daily  . Hyperlipidemia     takes Atorvastatin daily  . Joint swelling   . Nocturia     Social History Social History  Substance Use Topics  . Smoking status: Former Smoker    Types: Cigarettes  . Smokeless tobacco: Never Used  . Alcohol Use: No    Family History History reviewed. No pertinent family history.  Past Surgical History  Procedure Laterality Date  . Back surgery    . Peripheral vascular catheterization N/A 01/24/2015    Procedure: Abdominal Aortogram;  Surgeon: Sherren Kerns, MD;  Location: [facility] INVASIVE CV LAB;  Service:  Cardiovascular;  Laterality: N/A;  . Colonosocpy    . Endarterectomy femoral Right 02/25/2015    Procedure: ENDARTERECTOMYRight  FEMORAL Endarterectomy with profundaplasty.;  Surgeon: Sherren Kerns, MD;  Location: [facility] OR;  Service: Vascular;  Laterality: Right;  . Femoral-popliteal bypass graft Right 02/25/2015    Procedure: BYPASS GRAFT FEMORAL-POPLITEAL ARTERY using non-reversed saphenous vein.;  Surgeon: Sherren Kerns, MD;  Location: [facility] OR;  Service: Vascular;  Laterality: Right;    Allergies   Allergen Reactions  . Chocolate Hives    "welps"  . Sulfur Rash    Current Outpatient Prescriptions  Medication Sig Dispense Refill  . acetaminophen (TYLENOL) 500 MG tablet Take 1,500-2,000 mg by mouth daily as needed (pain).     Marland Kitchen amLODipine (NORVASC) 5 MG tablet Take 1 tablet (5 mg total) by mouth daily. (Patient taking differently: Take 5 mg by mouth at bedtime. ) 180 tablet 3  . aspirin 81 MG chewable tablet Chew 81 mg by mouth daily.    Marland Kitchen atorvastatin (LIPITOR) 20 MG tablet Take 1 tablet (20 mg total) by mouth daily. 90 tablet 3  . Coenzyme Q10 (CO Q10) 100 MG CAPS Take 200 mg by mouth daily. 30 each 11  . hydrochlorothiazide (MICROZIDE) 12.5 MG capsule take 1 capsule by mouth once daily 90 capsule 3  . levofloxacin (LEVAQUIN) 500 MG tablet Take 1 tablet (500 mg total) by mouth daily. 14 tablet 0  . ramipril (ALTACE) 10 MG capsule Take 1 capsule (10 mg total) by mouth at bedtime. 30 capsule 0  . cephALEXin (KEFLEX) 500 MG capsule Take 1 capsule (500 mg total) by mouth 4 (four) times daily. (Patient not taking: Reported on 04/23/2015) 40 capsule 0  . HYDROcodone-acetaminophen (NORCO) 10-325 MG tablet Take 1 tablet by mouth every 8 (eight) hours as needed. (Patient not taking: Reported on 04/23/2015) 40 tablet 0  . oxyCODONE-acetaminophen (PERCOCET/ROXICET) 5-325 MG tablet Take 1 tablet by mouth every 4 (four) hours as needed for severe pain. (Patient not taking: Reported on 06/04/2015) 30 tablet 0   No current facility-administered medications for this visit.    ROS: See HPI for pertinent positives and negatives.   Physical Examination  Filed Vitals:   06/04/15 1509 06/04/15 1518  BP: 162/61 153/58  Pulse: 49 47  Temp: 98 F (36.7 C)   TempSrc: Oral   Resp: 14   Height: 5\' 9"  (1.753 m)   Weight: 179 lb (81.194 kg)   SpO2: 100%    Body mass index is 26.42 kg/(m^2).  General: A&O x 3, WDWN. Gait: normal Eyes: PERRLA. Pulmonary: non labored respirations  Cardiac: regular  rythm and rate of 64    Aorta is not palpable. Radial pulses: 2+ palpable and =                           VASCULAR EXAM: Extremities without ischemic changes, without Gangrene; with open wounds: right leg medial incision at the proximal end, just below his knee, is open about 2 cm x 1 cm, draining copious, cloudy, foul smelling, thin drainage, with bits of pale yellow tissue. 2-3+ pitting and non pitting edema in right lower leg with erythema in the lower leg to ankle. Venous stasis dermatitis and 1+ edema in the left lower leg.  LE Pulses Right Left       FEMORAL   palpable   palpable        POPLITEAL  not palpable   not palpable       POSTERIOR TIBIAL  not palpable   not palpable        DORSALIS PEDIS      ANTERIOR TIBIAL faintly palpable  faintly palpable    Abdomen: soft, NT, no palpable masses. Skin: no rashes, no ulcers. Musculoskeletal: no muscle wasting or atrophy.  Neurologic: A&O X 3; Appropriate Affect ; SENSATION: normal; MOTOR FUNCTION:  moving all extremities equally, motor strength 5/5 throughout except 4/5 in right leg. Speech is fluent/normal. CN 2-12 intact except is hard of hearing.    ASSESSMENT: AGAPITO HANWAY is a 75 y.o. male  is s/p right femoral to below-knee popliteal bypass with non-reversed greater saphenous vein on 02/25/2015. He also had right common femoral endarterectomy with profundoplasty and a vein patch at that time.  He returns today with continued copious, lately foul smelling drainage from the opening in the proximal aspect of his right lower leg incision, just below his knee. His pain at the open incision area has increased, and he complained of the prescription analgesic constipating him.  The redness has increased in his right leg. The swelling in both lower legs resolves with overnight elevation of his legs.  Cellulitis of right  leg/chronic venous stasis: Keflex, elevation of legs during the day for 20 minutes, 4x/day, in addition to overnight.  Pain of right leg: tramadol, see Plan. Seroma of right leg: Dressing changes to right leg incision cavity daily and prn, see Plan.  Dr. Edilia Bo spoke with and examined Kevin Reilly  Face to face time with patient was 25 minutes. Over 50% of this time was spent on counseling and coordination of care.    PLAN:  Based on the patient's vascular studies and examination, Kevin Reilly will return to clinic in 1 week for right leg evaluation by Dr. [name]. Cephalexin 500 mg po tid x 2 weeks, disp #42, 0 refills. Tramadol 50 mg, one po every 6 hours prn pain, disp #20, 0 refills.  Kevin Reilly to continue removing packing in open right leg medial incision daily, cleansing with soap and warm water daily, repacking with sterile ribbon gauze using a new sterile cotton swab daily, and applying catchment dressing for drainage over this, as instructed and dressed in office.  I discussed in depth with the patient the nature of atherosclerosis, and emphasized the importance of maximal medical management including strict control of blood pressure, blood glucose, and lipid levels, obtaining regular exercise, and continued cessation of smoking.  The patient is aware that without maximal medical management the underlying atherosclerotic disease process will progress, limiting the benefit of any interventions.  The patient was given information about PAD including signs, symptoms, treatment, what symptoms should prompt the patient to seek immediate medical care, and risk reduction measures to take.  Charisse March, RN, MSN, FNP-C [facility] Phone: [phone]  Clinic MD: Edilia Bo  06/04/2015 3:34 PM

## 2015-06-10 ENCOUNTER — Encounter: Payer: Self-pay | Admitting: Vascular Surgery

## 2015-06-10 ENCOUNTER — Telehealth: Payer: Self-pay

## 2015-06-10 NOTE — Telephone Encounter (Signed)
phone call from pt.  Requested to have Rx for packing material called to pharmacy.  Also requested refill on pain medication; "my leg hurts."  Stated there is continued drainage and odor to the right LE wound.  Stated there is continue redness in the surrounding tissue.  Pt. was recommended to f/u in office on 3/2, but initially declined.   Advised to schedule appt. For wound check this week, with continued symptoms.  Pt. Agreed.  Appt. given for 2:15 PM 06/12/15.

## 2015-06-12 ENCOUNTER — Ambulatory Visit (INDEPENDENT_AMBULATORY_CARE_PROVIDER_SITE_OTHER): Payer: Medicare Other | Admitting: Family

## 2015-06-12 ENCOUNTER — Encounter: Payer: Self-pay | Admitting: Family

## 2015-06-12 VITALS — BP 148/57 | HR 50 | Temp 97.3°F | Ht 69.0 in | Wt 179.0 lb

## 2015-06-12 DIAGNOSIS — L03115 Cellulitis of right lower limb: Secondary | ICD-10-CM | POA: Diagnosis not present

## 2015-06-12 DIAGNOSIS — Z95828 Presence of other vascular implants and grafts: Secondary | ICD-10-CM

## 2015-06-12 DIAGNOSIS — T148 Other injury of unspecified body region: Secondary | ICD-10-CM | POA: Diagnosis not present

## 2015-06-12 DIAGNOSIS — G8918 Other acute postprocedural pain: Secondary | ICD-10-CM

## 2015-06-12 DIAGNOSIS — I779 Disorder of arteries and arterioles, unspecified: Secondary | ICD-10-CM

## 2015-06-12 DIAGNOSIS — IMO0002 Reserved for concepts with insufficient information to code with codable children: Secondary | ICD-10-CM

## 2015-06-12 MED ORDER — CEPHALEXIN 500 MG PO CAPS: 500.0000 mg | ORAL_CAPSULE | Freq: Three times a day (TID) | ORAL | Status: DC

## 2015-06-12 MED ORDER — OXYCODONE-ACETAMINOPHEN 5-325 MG PO TABS: 1.0000 | ORAL_TABLET | Freq: Four times a day (QID) | ORAL | Status: DC | PRN

## 2015-06-12 NOTE — Progress Notes (Signed)
[facility] [place] HISTORY AND PHYSICAL -PAD  History of Present Illness Kevin Reilly is a 75 y.o. male patient of Dr. [name] who is s/p right femoral to below-knee popliteal bypass with non-reversed greater saphenous vein on 02/25/2015. He also had right common femoral endarterectomy with profundoplasty and a vein patch at that time. His right foot has been healing. He has no claudication symptoms. However, he has had drainage from his below-knee incision. A small incision was placed in this a few weeks ago by Dr. Myra Gianotti. He has continued to drain clear fluid from this. It is on continuous in nature. He has no fever or chills. His groin incision and other thigh incisions are well-healed.  Dr. [name] last saw pt on 05/21/15. At that time Dr. [name] determined pt had a seroma versus lymphocele at right below-knee incision. This was continuing to drain fluid. Dr. [name] did not believe this was infected. Pt had some irritation of the skin edge most likely from fluid. Dr. [name] did not believe he needed further antibiotics at that point. Plan at at that time was to set up home health wound care to do dry Nu Gauze in this cavity once daily. The patient was to return for follow-up with Dr. [name] in 3 weeks. Dr. [name] also encouraged the patient to wear bilateral lower extremity compression stockings to help with the edema in both of his legs as some of this fluid coming from his below-knee incision probably is transudate as well.  Pt returns today for one week follow up for draining wound evaluation. A week ago I saw pt. At that time he had continued copious,  foul smelling drainage from the opening in the proximal aspect of his right lower leg incision, just below his knee. His pain at the open incision area had increased, and he complained of the prescription analgesic constipating him.  The redness had increased in his right leg. The swelling in both lower legs resolves with  overnight elevation of his legs. Cellulitis of right leg/chronic venous stasis: Keflex, elevation of legs during the day for 20 minutes, 4x/day, in addition to overnight.  Pain of right leg: tramadol. Seroma of right leg: Dressing changes to right leg incision cavity daily and prn.  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   He denies claudication sx's with walking; states the swelling in his lower legs resolves by morning with elevation of his legs.   Pt Diabetic: No Pt smoker: former smoker  Pt meds include: Statin :Yes Betablocker: No ASA: Yes Other anticoagulants/antiplatelets: no     Past Medical History  Diagnosis Date  . Critical lower limb ischemia   . Peripheral arterial disease (HCC)   . Bilateral carotid artery disease (HCC)   . Hypertension     takes Amlodipine and Ramipril daily  . Hyperlipidemia     takes Atorvastatin daily  . Joint swelling   . Nocturia     Social History Social History  Substance Use Topics  . Smoking status: Former Smoker    Types: Cigarettes  . Smokeless tobacco: Never Used  . Alcohol Use: No    Family History No family history on file.  Past Surgical History  Procedure Laterality Date  . Back surgery    . Peripheral vascular catheterization N/A 01/24/2015    Procedure: Abdominal Aortogram;  Surgeon: Sherren Kerns, MD;  Location: [facility] INVASIVE CV LAB;  Service: Cardiovascular;  Laterality: N/A;  . Colonosocpy    .  Endarterectomy femoral Right 02/25/2015    Procedure: ENDARTERECTOMYRight  FEMORAL Endarterectomy with profundaplasty.;  Surgeon: Sherren Kerns, MD;  Location: [facility] OR;  Service: Vascular;  Laterality: Right;  . Femoral-popliteal bypass graft Right 02/25/2015    Procedure: BYPASS GRAFT FEMORAL-POPLITEAL ARTERY using non-reversed saphenous vein.;  Surgeon: Sherren Kerns, MD;  Location: [facility] OR;  Service: Vascular;  Laterality: Right;    Allergies  Allergen Reactions  . Chocolate Hives     "welps"  . Sulfur Rash    Current Outpatient Prescriptions  Medication Sig Dispense Refill  . acetaminophen (TYLENOL) 500 MG tablet Take 1,500-2,000 mg by mouth daily as needed (pain).     Marland Kitchen amLODipine (NORVASC) 5 MG tablet Take 1 tablet (5 mg total) by mouth daily. (Patient taking differently: Take 5 mg by mouth at bedtime. ) 180 tablet 3  . aspirin 81 MG chewable tablet Chew 81 mg by mouth daily.    Marland Kitchen atorvastatin (LIPITOR) 20 MG tablet Take 1 tablet (20 mg total) by mouth daily. 90 tablet 3  . cephALEXin (KEFLEX) 500 MG capsule Take 1 capsule (500 mg total) by mouth 3 (three) times daily. 42 capsule 0  . Coenzyme Q10 (CO Q10) 100 MG CAPS Take 200 mg by mouth daily. 30 each 11  . hydrochlorothiazide (MICROZIDE) 12.5 MG capsule take 1 capsule by mouth once daily 90 capsule 3  . HYDROcodone-acetaminophen (NORCO) 10-325 MG tablet Take 1 tablet by mouth every 8 (eight) hours as needed. (Patient not taking: Reported on 04/23/2015) 40 tablet 0  . levofloxacin (LEVAQUIN) 500 MG tablet Take 1 tablet (500 mg total) by mouth daily. 14 tablet 0  . oxyCODONE-acetaminophen (PERCOCET/ROXICET) 5-325 MG tablet Take 1 tablet by mouth every 4 (four) hours as needed for severe pain. (Patient not taking: Reported on 06/04/2015) 30 tablet 0  . ramipril (ALTACE) 10 MG capsule Take 1 capsule (10 mg total) by mouth at bedtime. 30 capsule 0  . traMADol (ULTRAM) 50 MG tablet Take 1 tablet (50 mg total) by mouth every 6 (six) hours as needed. 20 tablet 0   No current facility-administered medications for this visit.    ROS: See HPI for pertinent positives and negatives.   Physical Examination  Filed Vitals:   06/12/15 1404 06/12/15 1408  BP: 144/55 148/57  Pulse: 50   Temp: 97.3 F (36.3 C)   TempSrc: Oral   Height: 5\' 9"  (1.753 m)   Weight: 179 lb (81.194 kg)   SpO2: 99%      Body mass index is 26.42 kg/(m^2).  General: A&O x 3, WDWN. Gait: normal Eyes: PERRLA. Pulmonary: non labored respirations   Cardiac: regular rythm and bradycardic rate of 54   Aorta is not palpable. Radial pulses: 2+ palpable and =   VASCULAR EXAM: Extremities without ischemic changes, without Gangrene; with open wounds: right leg medial incision at the proximal end, just below his knee, is open about 2 cm x 1 cm, draining copious, cloudy, thin drainage, with bits of pale yellow tissue. 2+ pitting and non pitting edema in right lower leg with less erythema in the lower leg than a week ago. Venous stasis dermatitis and 1+ edema in the left lower leg.     LE Pulses Right Left   FEMORAL  palpable  palpable    POPLITEAL not palpable  not palpable   POSTERIOR TIBIAL not palpable  not palpable    DORSALIS PEDIS  ANTERIOR TIBIAL faintly palpable  faintly palpable    Abdomen: soft, NT,  no palpable masses. Skin: no rashes, no ulcers, see Extremities. Musculoskeletal: no muscle wasting or atrophy. Neurologic: A&O X 3; Appropriate Affect; MOTOR FUNCTION: moving all extremities equally, motor strength 5/5 throughout except 4/5 in right leg. Speech is fluent/normal.  CN 2-12 intact except is hard of hearing.          ASSESSMENT: Kevin Reilly is a 75 y.o. male who is s/p right femoral to below-knee popliteal bypass with non-reversed greater saphenous vein on 02/25/2015. He also had right common femoral endarterectomy with profundoplasty and a vein patch at that time.  He returns today for 1 week wound check.  Dr. [name] spoke with and examined pt.  Right lower leg cellulitis is improving, less erythema, less swelling. Pt seems to have been elevating his legs adequately.  Pt states tramadol was insufficient for pain relief.  The open part of the right medial leg incision is yet draining  copious amounts of thin, mostly clear fluid with scant bits of whitish/yellow tissue; the drainage is no longer foul smelling.  Continue daily soap and warm water cleansing of right leg open part of incision, continue once to twice daily packing the wound with ribbon gauze, applying clean absorbant dressing, and compression ace wrap from right foot to knee as demonstrated and instructed.  Continue Keflex until pt returns in 4 weeks (Dr. [name] is out of town in 2-3 weeks). Continue legs elevation above heart for 20 minutes, 3-4x/day, in addition to overnight. Percocet 5/235, one tablet po every 6 hours prn pain, disp #30, 0 refills; pt instructed not to drive or drink alcohol while taking Percocet.  He was also informed that Percocet will likely cause constipation, discussed the use of stool softeners, metamucil, Miralax.    PLAN:  Based on the patient's vascular studies and examination, pt will return to clinic in 4 weeks for a wound check with Dr. [name].  I advised pt to notify us immediately if he develops worsening symptoms in his right lower leg.  I discussed in depth with the patient the nature of atherosclerosis, and emphasized the importance of maximal medical management including strict control of blood pressure, blood glucose, and lipid levels, obtaining regular exercise, and continued cessation of smoking.  The patient is aware that without maximal medical management the underlying atherosclerotic disease process will progress, limiting the benefit of any interventions.  The patient was given information about PAD including signs, symptoms, treatment, what symptoms should prompt the patient to seek immediate medical care, and risk reduction measures to take.  Charisse March, RN, MSN, FNP-C [facility] Phone: [phone]  Clinic MD: [name]  06/12/2015 2:04 PM

## 2015-06-12 NOTE — Patient Instructions (Signed)
Peripheral Vascular Disease Peripheral vascular disease (PVD) is a disease of the blood vessels that are not part of your heart and brain. A simple term for PVD is poor circulation. In most cases, PVD narrows the blood vessels that carry blood from your heart to the rest of your body. This can result in a decreased supply of blood to your arms, legs, and internal organs, like your stomach or kidneys. However, it most often affects a person's lower legs and feet. There are two types of PVD.  Organic PVD. This is the more common type. It is caused by damage to the structure of blood vessels.  Functional PVD. This is caused by conditions that make blood vessels contract and tighten (spasm). Without treatment, PVD tends to get worse over time. PVD can also lead to acute ischemic limb. This is when an arm or limb suddenly has trouble getting enough blood. This is a medical emergency. CAUSES Each type of PVD has many different causes. The most common cause of PVD is buildup of a fatty material (plaque) inside of your arteries (atherosclerosis). Small amounts of plaque can break off from the walls of the blood vessels and become lodged in a smaller artery. This blocks blood flow and can cause acute ischemic limb. Other common causes of PVD include:  Blood clots that form inside of blood vessels.  Injuries to blood vessels.  Diseases that cause inflammation of blood vessels or cause blood vessel spasms.  Health behaviors and health history that increase your risk of developing PVD. RISK FACTORS  You may have a greater risk of PVD if you:  Have a family history of PVD.  Have certain medical conditions, including:  High cholesterol.  Diabetes.  High blood pressure (hypertension).  Coronary heart disease.  Past problems with blood clots.  Past injury, such as burns or a broken bone. These may have damaged blood vessels in your limbs.  Buerger disease. This is caused by inflamed blood  vessels in your hands and feet.  Some forms of arthritis.  Rare birth defects that affect the arteries in your legs.  Use tobacco.  Do not get enough exercise.  Are obese.  Are age 50 or older. SIGNS AND SYMPTOMS  PVD may cause many different symptoms. Your symptoms depend on what part of your body is not getting enough blood. Some common signs and symptoms include:  Cramps in your lower legs. This may be a symptom of poor leg circulation (claudication).  Pain and weakness in your legs while you are physically active that goes away when you rest (intermittent claudication).  Leg pain when at rest.  Leg numbness, tingling, or weakness.  Coldness in a leg or foot, especially when compared with the other leg.  Skin or hair changes. These can include:  Hair loss.  Shiny skin.  Pale or bluish skin.  Thick toenails.  Inability to get or maintain an erection (erectile dysfunction). People with PVD are more prone to developing ulcers and sores on their toes, feet, or legs. These may take longer than normal to heal. DIAGNOSIS Your health care provider may diagnose PVD from your signs and symptoms. The health care provider will also do a physical exam. You may have tests to find out what is causing your PVD and determine its severity. Tests may include:  Blood pressure recordings from your arms and legs and measurements of the strength of your pulses (pulse volume recordings).  Imaging studies using sound waves to take pictures of   the blood flow through your blood vessels (Doppler ultrasound).  Injecting a dye into your blood vessels before having imaging studies using:  X-rays (angiogram or arteriogram).  Computer-generated X-rays (CT angiogram).  A powerful electromagnetic field and a computer (magnetic resonance angiogram or MRA). TREATMENT Treatment for PVD depends on the cause of your condition and the severity of your symptoms. It also depends on your age. Underlying  causes need to be treated and controlled. These include long-lasting (chronic) conditions, such as diabetes, high cholesterol, and high blood pressure. You may need to first try making lifestyle changes and taking medicines. Surgery may be needed if these do not work. Lifestyle changes may include:  Quitting smoking.  Exercising regularly.  Following a low-fat, low-cholesterol diet. Medicines may include:  Blood thinners to prevent blood clots.  Medicines to improve blood flow.  Medicines to improve your blood cholesterol levels. Surgical procedures may include:  A procedure that uses an inflated balloon to open a blocked artery and improve blood flow (angioplasty).  A procedure to put in a tube (stent) to keep a blocked artery open (stent implant).  Surgery to reroute blood flow around a blocked artery (peripheral bypass surgery).  Surgery to remove dead tissue from an infected wound on the affected limb.  Amputation. This is surgical removal of the affected limb. This may be necessary in cases of acute ischemic limb that are not improved through medical or surgical treatments. HOME CARE INSTRUCTIONS  Take medicines only as directed by your health care provider.  Do not use any tobacco products, including cigarettes, chewing tobacco, or electronic cigarettes. If you need help quitting, ask your health care provider.  Lose weight if you are overweight, and maintain a healthy weight as directed by your health care provider.  Eat a diet that is low in fat and cholesterol. If you need help, ask your health care provider.  Exercise regularly. Ask your health care provider to suggest some good activities for you.  Use compression stockings or other mechanical devices as directed by your health care provider.  Take good care of your feet.  Wear comfortable shoes that fit well.  Check your feet often for any cuts or sores. SEEK MEDICAL CARE IF:  You have cramps in your legs  while walking.  You have leg pain when you are at rest.  You have coldness in a leg or foot.  Your skin changes.  You have erectile dysfunction.  You have cuts or sores on your feet that are not healing. SEEK IMMEDIATE MEDICAL CARE IF:  Your arm or leg turns cold and blue.  Your arms or legs become red, warm, swollen, painful, or numb.  You have chest pain or trouble breathing.  You suddenly have weakness in your face, arm, or leg.  You become very confused or lose the ability to speak.  You suddenly have a very bad headache or lose your vision.   This information is not intended to replace advice given to you by your health care provider. Make sure you discuss any questions you have with your health care provider.   Document Released: 05/06/2004 Document Revised: 04/19/2014 Document Reviewed: 09/06/2013 Elsevier Interactive Patient Education 2016 [employer]  

## 2015-06-19 ENCOUNTER — Ambulatory Visit: Payer: Medicare Other | Admitting: Vascular Surgery

## 2015-06-26 ENCOUNTER — Telehealth: Payer: Self-pay | Admitting: Vascular Surgery

## 2015-06-26 NOTE — Telephone Encounter (Signed)
Attempted to contact pt. At the home # and the mobile #; no voice mail set up on either phone, so unable to leave a message.

## 2015-06-26 NOTE — Telephone Encounter (Signed)
Kevin Reilly would like to speak with someone about a "place" on his other leg. He can be reached at the home # listed.   Thank you, Annabelle Harman

## 2015-06-27 NOTE — Telephone Encounter (Signed)
Attempted to return call to pt. On home # and mobile #; unable to reach or to leave a voice message at this time.

## 2015-06-30 NOTE — Telephone Encounter (Signed)
Able to contact pt. Per phone this AM.  Stated he "has been sick with a virus."  Questioned about symptoms of his leg, that he called office about on 3/16.  Reported an area on the left lower leg on lateral side that some skin peeled off, and was painful, last week.  Reported "there is a dry scab over it now, and it is getting better."  Denied any redness or drainage from the left lower leg.  Reported he continues to have clear drainage from the right lower leg incision.  Stated that he continues to wash daily with soap and water and pack it as was instructed.  Stated there is still a little redness @ the right leg wound, noted some improvement.   Stated that since his left leg has improved, and still taking an antibiotic, he will keep his appt. On 07/10/15 with Dr. [name].  Advised to call sooner is symptoms worsen.  Verb. Understanding.

## 2015-07-02 ENCOUNTER — Encounter: Payer: Self-pay | Admitting: Vascular Surgery

## 2015-07-09 ENCOUNTER — Telehealth: Payer: Self-pay | Admitting: *Deleted

## 2015-07-09 NOTE — Telephone Encounter (Signed)
Kevin Reilly called today to request more antibiotics for his draining leg wound. He spoke to [facility] on 06-30-15 and said he knew he had an appt to see Dr. [name] on 07-10-15 at 11:45. Today, patient says that "we should just give him some more antibiotics without being seen because he can't leave the [facility]". I told him that we needed to see him in the office first because he may have other issues but Dr. [name] would need to make that determination based on a physical exam. Patient refused to answer questions regarding drainage, fever etc. Kevin Reilly became very angry and slammed down the phone.

## 2015-07-10 ENCOUNTER — Ambulatory Visit: Payer: Medicare Other | Admitting: Vascular Surgery

## 2015-07-15 ENCOUNTER — Encounter: Payer: Self-pay | Admitting: Vascular Surgery

## 2015-07-16 ENCOUNTER — Ambulatory Visit (INDEPENDENT_AMBULATORY_CARE_PROVIDER_SITE_OTHER): Payer: Medicare Other | Admitting: Vascular Surgery

## 2015-07-16 ENCOUNTER — Encounter: Payer: Self-pay | Admitting: Vascular Surgery

## 2015-07-16 VITALS — BP 182/35 | HR 52 | Temp 98.2°F | Resp 12 | Ht 69.0 in | Wt 180.0 lb

## 2015-07-16 DIAGNOSIS — I739 Peripheral vascular disease, unspecified: Secondary | ICD-10-CM | POA: Diagnosis not present

## 2015-07-16 DIAGNOSIS — I779 Disorder of arteries and arterioles, unspecified: Secondary | ICD-10-CM | POA: Diagnosis not present

## 2015-07-16 MED ORDER — CEPHALEXIN 500 MG PO CAPS: 500.0000 mg | ORAL_CAPSULE | Freq: Three times a day (TID) | ORAL | Status: DC

## 2015-07-16 NOTE — Progress Notes (Signed)
Patient is a 75 year old male who returns for postoperative follow-up. He underwent a right femoral to below-knee popliteal bypass with non-reversed greater saphenous vein on 02/25/2015. He also had right common femoral endarterectomy with profundoplasty and a vein patch at that time. His right foot has been healing. He has no claudication symptoms. However, he has had drainage from his below-knee incision. A small incision was placed in this a few weeks ago by my partner Dr. Myra Gianotti. He has continued to drain clear fluid from this. It is on continuous in nature. He has no fever or chills. His groin incision and other thigh incisions are well-healed. The drainage is less than at his last office visit. The hole in his leg also is smaller. He still has some erythema surrounding the incision. He denies fever or chills. He continues to refuse to elevate the leg. He has had persistent leg swelling in the right leg.  Physical exam:    Filed Vitals:   07/16/15 1522  BP: 182/35  Pulse: 52  Temp: 98.2 F (36.8 C)  TempSrc: Oral  Resp: 12  Height: 5\' 9"  (1.753 m)  Weight: 180 lb (81.647 kg)  SpO2: 100%    Right lower Extremity: all incisions healed in the right leg except for the below-knee incision. There is a 2 mm opening with clear fluid draining from this.  He has 2+ edema in both lower extremities. 2+ popliteal pulse right leg 1+ dorsalis pedis pulse  Assessment: Lymphatic leak right leg. I do not believe this is infected but the patient is insistent that he needs antibiotics improve wound healing overall.  Plan: We will stop packing the wound at this point in hopes that the lymphatic leak will seal over. The patient will return for follow-up with me in 2 weeks. I also encouraged the patient to wear bilateral lower extremity compression stockings to help with the edema in both of his legs as some of this fluid coming from his below-knee incision probably is transudate as well.   Fabienne Bruns,  MD [facility] Office: [phone] Pager: [phone]

## 2015-07-24 ENCOUNTER — Ambulatory Visit: Payer: Medicare Other | Admitting: Vascular Surgery

## 2015-07-24 ENCOUNTER — Ambulatory Visit (HOSPITAL_COMMUNITY): Payer: Medicare Other

## 2015-07-31 ENCOUNTER — Ambulatory Visit: Payer: Medicare Other | Admitting: Vascular Surgery

## 2015-09-25 ENCOUNTER — Ambulatory Visit (HOSPITAL_COMMUNITY): Payer: Medicare Other

## 2015-10-02 ENCOUNTER — Ambulatory Visit: Payer: Medicare Other | Admitting: Vascular Surgery

## 2016-01-15 ENCOUNTER — Encounter (HOSPITAL_COMMUNITY): Payer: Medicare Other

## 2016-01-15 ENCOUNTER — Other Ambulatory Visit (HOSPITAL_COMMUNITY): Payer: Medicare Other

## 2016-01-15 ENCOUNTER — Ambulatory Visit: Payer: Medicare Other | Admitting: Vascular Surgery

## 2016-03-07 ENCOUNTER — Other Ambulatory Visit: Payer: Self-pay | Admitting: Cardiovascular Disease

## 2016-03-08 NOTE — Telephone Encounter (Signed)
REFILL 

## 2016-07-28 ENCOUNTER — Encounter: Payer: Self-pay | Admitting: Cardiology

## 2017-09-25 IMAGING — MR MR FOOT*R* W/O CM
4 of 5 series · 25 of 40 positions shown · non-contrast
Comparison: Outside plain film 12/24/2013.

CLINICAL DATA: Chronic ulcer along the right fifth metatarsal with
knee. The ulcer has been present for several years. Initial
encounter.

EXAM:
MRI OF THE RIGHT FOREFOOT WITHOUT CONTRAST
TECHNIQUE: Multiplanar, multisequence MR imaging was performed. No intravenous
contrast was administered.

[Series 3: STIR · sagittal · 3.0mm · 0.35mm/px · 3 of 32 slices shown]
[im 5/32]
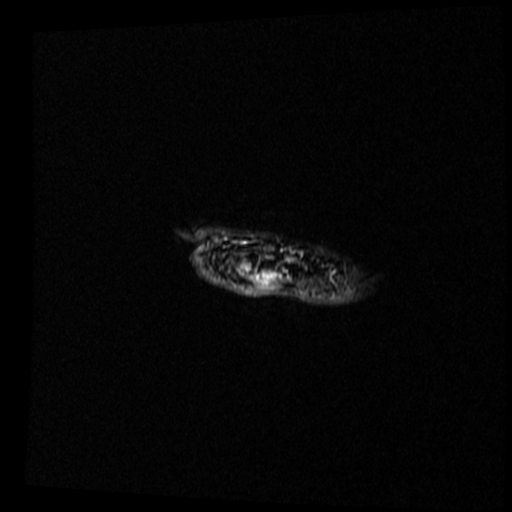
[im 18/32]
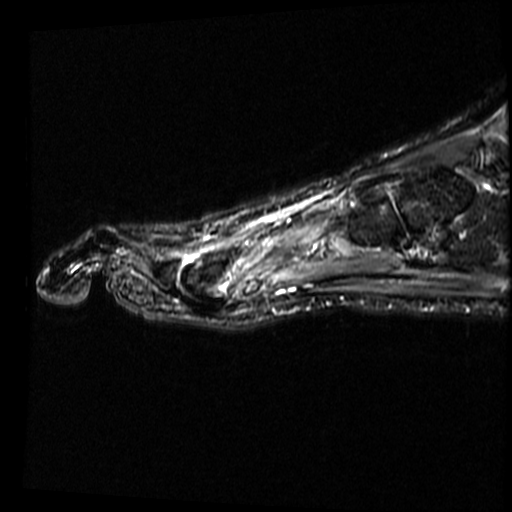
[im 27/32]
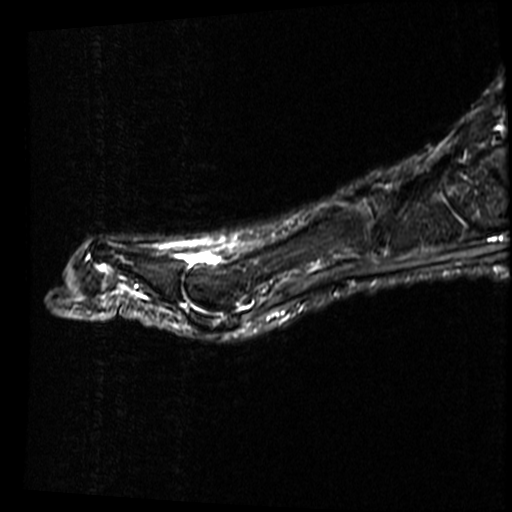

[Series 4: T1 · coronal · 4.0mm · 0.27mm/px · 8 of 34 slices shown (1 of 2)]
[im 1/34]
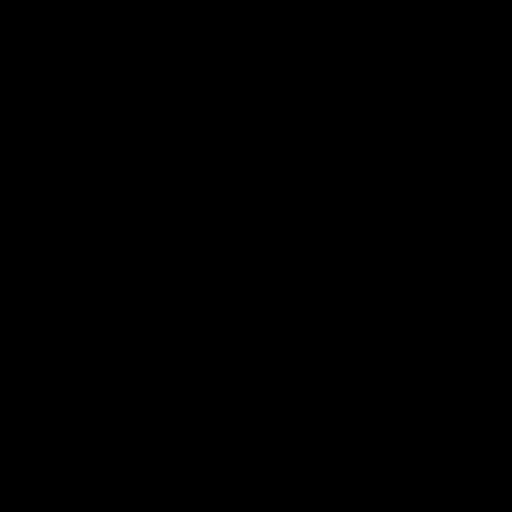
[im 4/34]
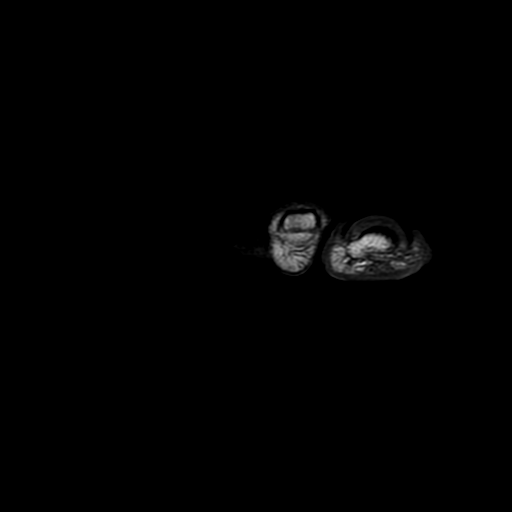
[im 12/34]
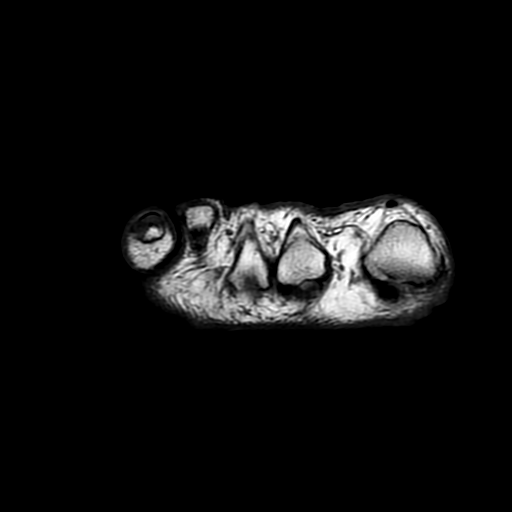
[im 15/34]
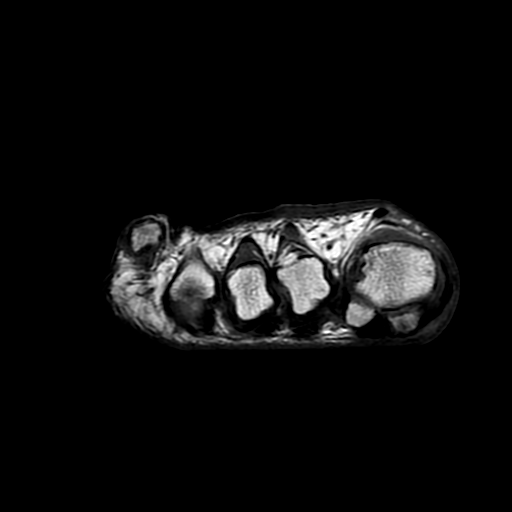
[im 19/34]
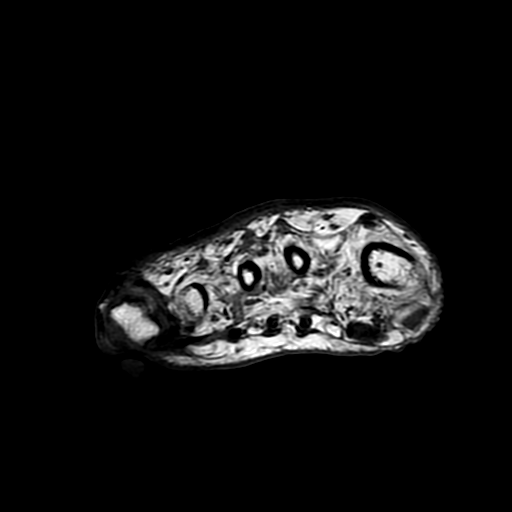
[im 23/34]
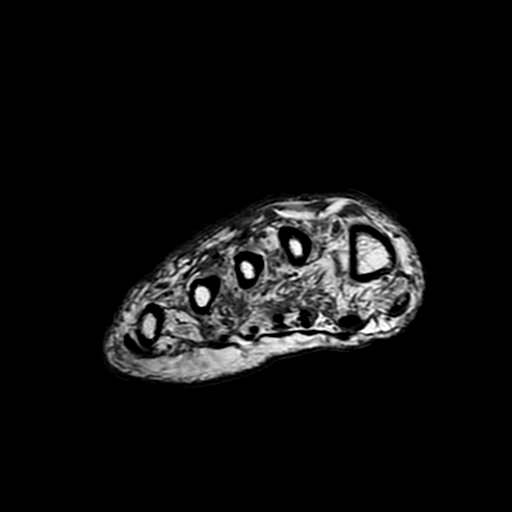
[im 30/34]
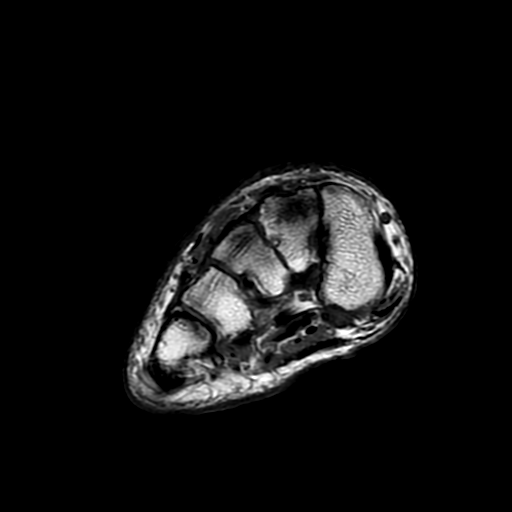
[im 34/34]
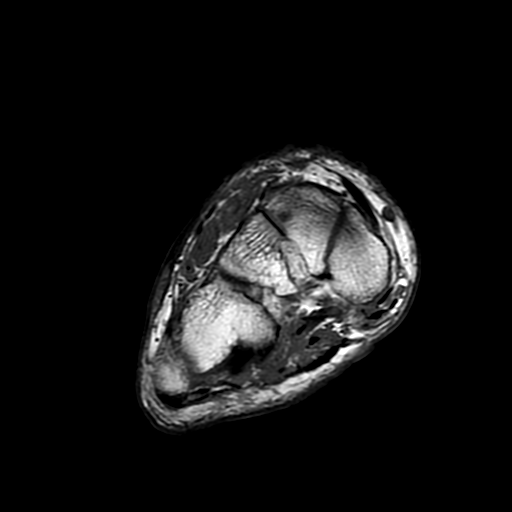

[Series 5: T2 fat-sat · coronal · 4.0mm · 0.55mm/px · 8 of 34 slices shown]
[im 1/34]
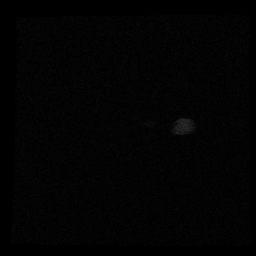
[im 4/34]
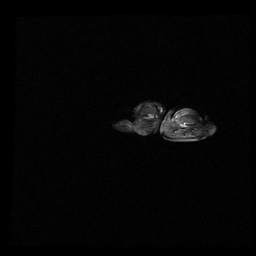
[im 12/34]
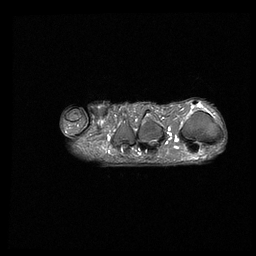
[im 15/34]
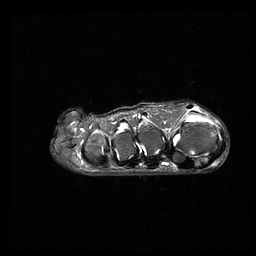
[im 19/34]
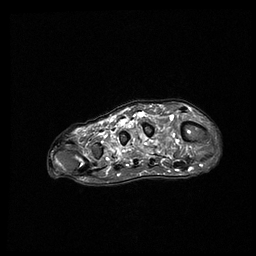
[im 23/34]
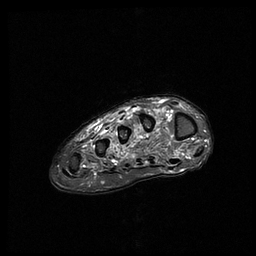
[im 30/34]
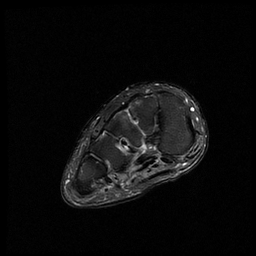
[im 34/34]
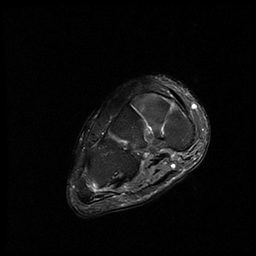

[Series 9: T1 · axial · 3.0mm · 0.31mm/px · z∈[-97,-41]mm · 6 of 19 slices shown (2 of 2)]
[im 1/19]
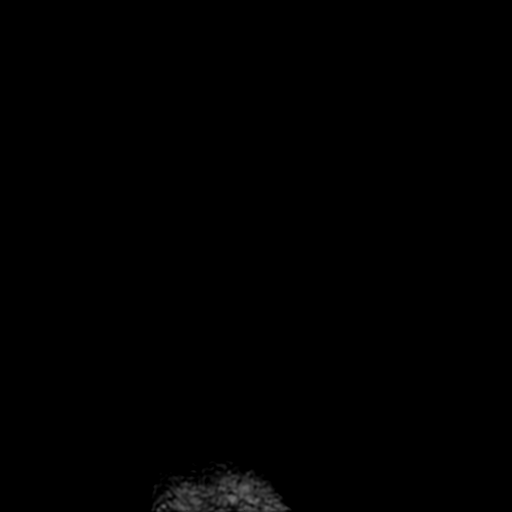
[im 4/19]
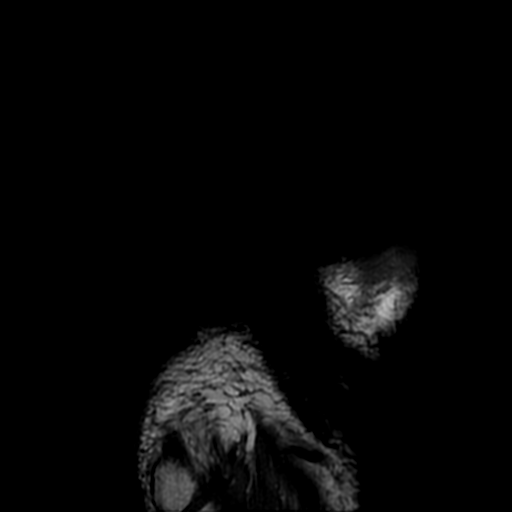
[im 8/19]
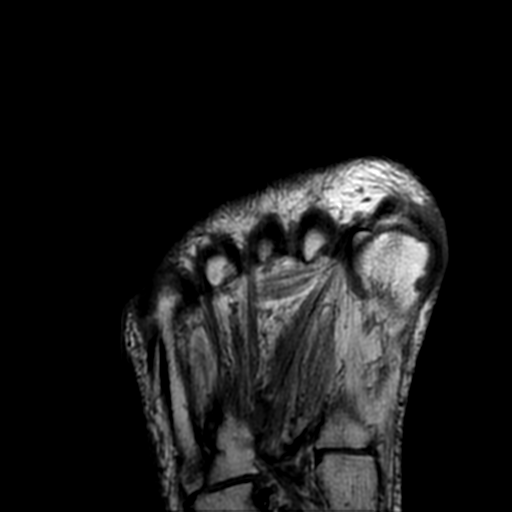
[im 11/19]
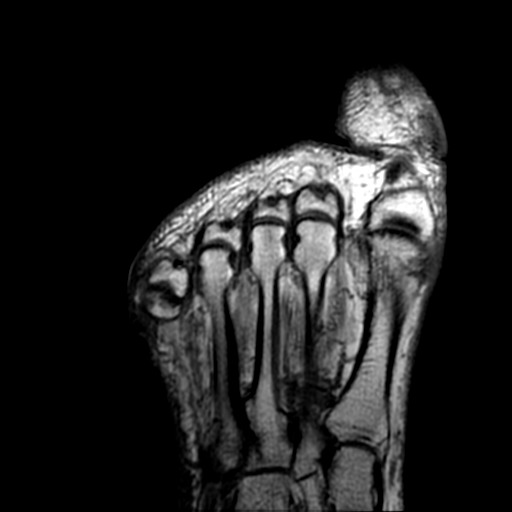
[im 15/19]
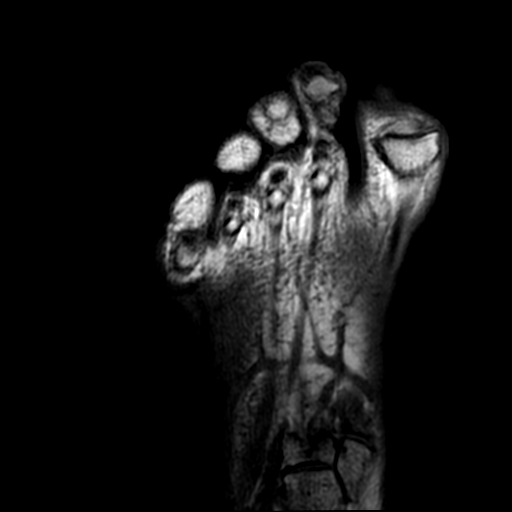
[im 19/19]
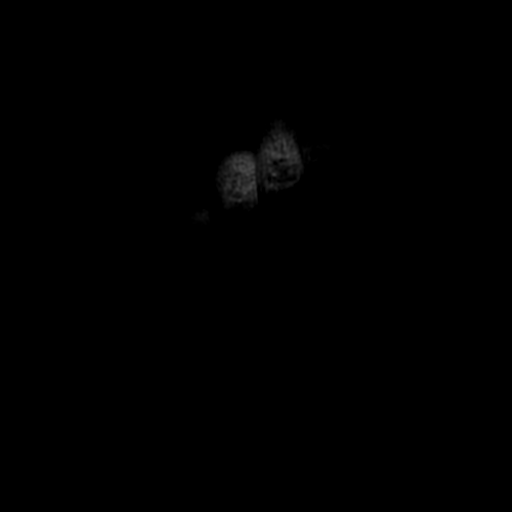

[25 of 40 positions shown; findings below may reference images not displayed]

FINDINGS: A marker is placed in the region of concern of the level of the
fifth metatarsal head. There is no underlying marrow signal
abnormality to suggest osteomyelitis. No abscess is identified. Soft
tissues about the wound demonstrate decreased T1 and mildly
increased T2 signal which could be due to the presence of
granulation tissue.

No mass is identified. Intrinsic musculature of the foot is
atrophied. All imaged tendons appear normal. There is mild first MTP
osteoarthritis.
IMPRESSION: Skin ulceration at approximately the level of the head of the fifth
metatarsal without underlying abscess or osteomyelitis. Likely
granulation tissue or possibly cellulitis about the lesion is
identified.

Mild first MTP osteoarthritis.

## 2018-11-07 ENCOUNTER — Emergency Department (HOSPITAL_COMMUNITY): Payer: Medicare Other

## 2018-11-07 ENCOUNTER — Other Ambulatory Visit: Payer: Self-pay

## 2018-11-07 ENCOUNTER — Ambulatory Visit (HOSPITAL_BASED_OUTPATIENT_CLINIC_OR_DEPARTMENT_OTHER): Payer: Medicare Other

## 2018-11-07 ENCOUNTER — Encounter (HOSPITAL_COMMUNITY): Payer: Medicare Other

## 2018-11-07 ENCOUNTER — Encounter (HOSPITAL_COMMUNITY): Payer: Self-pay

## 2018-11-07 ENCOUNTER — Emergency Department (HOSPITAL_COMMUNITY)
Admission: EM | Admit: 2018-11-07 | Discharge: 2018-11-07 | Disposition: A | Discharge status: Home / Self Care | Payer: Medicare Other | Attending: Emergency Medicine | Admitting: Emergency Medicine

## 2018-11-07 DIAGNOSIS — M79661 Pain in right lower leg: Secondary | ICD-10-CM | POA: Diagnosis present

## 2018-11-07 DIAGNOSIS — R609 Edema, unspecified: Secondary | ICD-10-CM | POA: Diagnosis not present

## 2018-11-07 DIAGNOSIS — Z79899 Other long term (current) drug therapy: Secondary | ICD-10-CM | POA: Insufficient documentation

## 2018-11-07 DIAGNOSIS — L03115 Cellulitis of right lower limb: Secondary | ICD-10-CM | POA: Insufficient documentation

## 2018-11-07 DIAGNOSIS — I10 Essential (primary) hypertension: Secondary | ICD-10-CM | POA: Diagnosis not present

## 2018-11-07 DIAGNOSIS — Z87891 Personal history of nicotine dependence: Secondary | ICD-10-CM | POA: Diagnosis not present

## 2018-11-07 DIAGNOSIS — Z7982 Long term (current) use of aspirin: Secondary | ICD-10-CM | POA: Diagnosis not present

## 2018-11-07 MED ORDER — DOXYCYCLINE HYCLATE 100 MG PO CAPS: 100.0000 mg | ORAL_CAPSULE | Freq: Two times a day (BID) | ORAL | 0 refills | Status: DC

## 2018-11-07 NOTE — ED Provider Notes (Signed)
[facility]-EMERGENCY DEPT Provider Note   CSN: [id number] Arrival date & time: 11/07/18  1400    History   Chief Complaint Chief Complaint  Patient presents with   Right Leg Pain   Right Leg Wound    HPI Kevin Reilly is a 78 y.o. male.     The history is provided by the patient and medical records. No language interpreter was used.   Kevin Reilly is a 78 y.o. male  with a PMH as listed below including peripheral arterial disease who presents to the Emergency Department complaining of redness and swelling to the right lower extremity.  Patient endorses twisting his ankle and falling about 2 weeks ago.  He has noticed swelling since that time which has been progressively worsening.  He does have an abrasion wound to the anterior aspect of his distal leg secondary to this fall.  He started having worsening redness few days later which has been progressively worsening.  No previous history of DVT or PE.  No chest pain or shortness of breath.  No fever.  Past Medical History:  Diagnosis Date   Bilateral carotid artery disease (HCC)    Critical lower limb ischemia    Hyperlipidemia    takes Atorvastatin daily   Hypertension    takes Amlodipine and Ramipril daily   Joint swelling    Nocturia    Peripheral arterial disease [facility](HCC)     Patient Active Problem List   Diagnosis Date Noted   PAD (peripheral artery disease) (HCC) 02/25/2015   Bilateral carotid artery disease (HCC) 02/05/2015   Essential hypertension 01/01/2014   Critical lower limb ischemia 01/01/2014   Bilateral lower extremity edema 01/01/2014    Past Surgical History:  Procedure Laterality Date   BACK SURGERY     colonosocpy     ENDARTERECTOMY FEMORAL Right 02/25/2015   Procedure: ENDARTERECTOMYRight  FEMORAL Endarterectomy with profundaplasty.;  Surgeon: Sherren Kernsharles E Fields, MD;  [facility];  Service: Vascular;  Laterality: Right;   FEMORAL-POPLITEAL BYPASS GRAFT  Right 02/25/2015   Procedure: BYPASS GRAFT FEMORAL-POPLITEAL ARTERY using non-reversed saphenous vein.;  Surgeon: Sherren Kernsharles E Fields, MD;  [facility];  Service: Vascular;  Laterality: Right;   PERIPHERAL VASCULAR CATHETERIZATION N/A 01/24/2015   Procedure: Abdominal Aortogram;  Surgeon: Sherren Kernsharles E Fields, MD;  Location: [facility] INVASIVE CV LAB;  Service: Cardiovascular;  Laterality: N/A;        Home Medications    Prior to Admission medications   Medication Sig Start Date End Date Taking? Authorizing Provider  acetaminophen (TYLENOL) 500 MG tablet Take 1,500-2,000 mg by mouth daily as needed (pain).     [provider]  amLODipine (NORVASC) 5 MG tablet Take 1 tablet (5 mg total) by mouth daily. Patient taking differently: Take 5 mg by mouth at bedtime.  02/05/15   Runell GessBerry, Jonathan J, MD  aspirin 81 MG chewable tablet Chew 81 mg by mouth daily.    [provider]  atorvastatin (LIPITOR) 20 MG tablet Take 1 tablet (20 mg total) by mouth daily. 02/13/15   Runell GessBerry, Jonathan J, MD  Coenzyme Q10 (CO Q10) 100 MG CAPS Take 200 mg by mouth daily. 02/13/15   Runell GessBerry, Jonathan J, MD  doxycycline (VIBRAMYCIN) 100 MG capsule Take 1 capsule (100 mg total) by mouth 2 (two) times daily. 11/07/18   Endia Moncur, Chase PicketJaime Pilcher, PA-C  hydrochlorothiazide (MICROZIDE) 12.5 MG capsule Take 1 capsule (12.5 mg total) by mouth daily. NEED OV. 03/08/16   Runell GessBerry, Jonathan J,  MD  HYDROcodone-acetaminophen (NORCO) 10-325 MG tablet Take 1 tablet by mouth every 8 (eight) hours as needed. Patient not taking: Reported on 04/23/2015 01/23/15   Sherren KernsFields, Charles E, MD  oxyCODONE-acetaminophen (PERCOCET/ROXICET) 5-325 MG tablet Take 1 tablet by mouth every 6 (six) hours as needed for severe pain. Patient not taking: Reported on 07/16/2015 06/12/15   Nickel, Carma LairSuzanne L, NP  ramipril (ALTACE) 10 MG capsule Take 1 capsule (10 mg total) by mouth at bedtime. 02/28/15   Sherren KernsFields, Charles E, MD  traMADol (ULTRAM) 50 MG tablet Take 1 tablet (50  mg total) by mouth every 6 (six) hours as needed. 06/04/15   Nickel, Carma LairSuzanne L, NP    Family History History reviewed. No pertinent family history.  Social History Social History   Tobacco Use   Smoking status: Former Smoker    Types: Cigarettes   Smokeless tobacco: Never Used  Substance Use Topics   Alcohol use: No    Alcohol/week: 0.0 standard drinks   Drug use: No     Allergies   Chocolate and Sulfur   Review of Systems Review of Systems  Cardiovascular: Positive for leg swelling. Negative for chest pain and palpitations.  Skin: Positive for color change and wound.  All other systems reviewed and are negative.    Physical Exam Updated Vital Signs BP (!) 163/53 (BP Location: Left Arm)    Pulse (!) 42    Temp 98.6 F (37 C) (Oral)    Resp 16    Ht 5\' 10"  (1.778 m)    Wt 83.9 kg    SpO2 99%    BMI 26.54 kg/m   Physical Exam Vitals signs and nursing note reviewed.  Constitutional:      General: He is not in acute distress.    Appearance: He is well-developed.  HENT:     Head: Normocephalic and atraumatic.  Neck:     Musculoskeletal: Neck supple.  Cardiovascular:     Rate and Rhythm: Normal rate and regular rhythm.     Heart sounds: Normal heart sounds. No murmur.  Pulmonary:     Effort: Pulmonary effort is normal. No respiratory distress.     Breath sounds: Normal breath sounds.  Abdominal:     General: There is no distension.     Palpations: Abdomen is soft.     Tenderness: There is no abdominal tenderness.  Musculoskeletal:     Comments: Swelling to right lower extremity compared to left.  Distal pulses intact.  Has an abrasion to the anterior distal right lower extremity approximately 5 cm and diameter with surrounding erythema and induration concerning for cellulitis.  Skin:    General: Skin is warm and dry.  Neurological:     Mental Status: He is alert and oriented to person, place, and time.      ED Treatments / Results  Labs (all labs  ordered are listed, but only abnormal results are displayed) Labs Reviewed - No data to display  EKG None  Radiology Dg Tibia/fibula Right  Result Date: 11/07/2018 CLINICAL DATA:  Nonhealing wound near the distal fibula just above the ankle. EXAM: RIGHT TIBIA AND FIBULA - 2 VIEW COMPARISON:  None. FINDINGS: There is no evidence of fracture or dislocation. There is no evidence of osteomyelitis. IMPRESSION: No acute bony abnormality identified. Electronically Signed   By: Sherian ReinWei-Chen  Lin M.D.   On: 11/07/2018 17:00   Dg Ankle Complete Right  Result Date: 11/07/2018 CLINICAL DATA:  Nonhealing wound/skin near the distal fibula just above  the ankle. EXAM: RIGHT ANKLE - COMPLETE 3+ VIEW COMPARISON:  None. FINDINGS: There is no evidence of fracture, dislocation, or joint effusion. There is no evidence of arthropathy or other focal bone abnormality. Mild soft tissue swelling is noted over the lateral malleolus. IMPRESSION: No evidence of osteomyelitis. Mild soft tissue swelling over the lateral malleolus. Electronically Signed   By: Sherian Rein M.D.   On: 11/07/2018 16:59   Vas [place] Lower Extremity Venous (dvt) (only [facility])  Result Date: 11/07/2018  Lower Venous Study Indications: Edema.  Comparison Study: previous study done 03/09/15 Performing Technologist: Blanch Media RVS  Examination Guidelines: A complete evaluation includes B-mode imaging, spectral Doppler, color Doppler, and power Doppler as needed of all accessible portions of each vessel. Bilateral testing is considered an integral part of a complete examination. Limited examinations for reoccurring indications may be performed as noted.  +---------+---------------+---------+-----------+----------+--------------+  RIGHT     Compressibility Phasicity Spontaneity Properties Summary         +---------+---------------+---------+-----------+----------+--------------+  CFV       Full            Yes       Yes                                     +---------+---------------+---------+-----------+----------+--------------+  SFJ       Full                                                             +---------+---------------+---------+-----------+----------+--------------+  FV Prox   Full                                                             +---------+---------------+---------+-----------+----------+--------------+  FV Mid    Full                                                             +---------+---------------+---------+-----------+----------+--------------+  FV Distal Full                                                             +---------+---------------+---------+-----------+----------+--------------+  PFV       Full                                                             +---------+---------------+---------+-----------+----------+--------------+  POP       Full            Yes  Yes                                    +---------+---------------+---------+-----------+----------+--------------+  PTV       Full                                                             +---------+---------------+---------+-----------+----------+--------------+  PERO                                                       Not visualized  +---------+---------------+---------+-----------+----------+--------------+   +----+---------------+---------+-----------+----------+-------+  LEFT Compressibility Phasicity Spontaneity Properties Summary  +----+---------------+---------+-----------+----------+-------+  CFV  Full            Yes       Yes                             +----+---------------+---------+-----------+----------+-------+     Summary: Right: There is no evidence of deep vein thrombosis in the lower extremity. No cystic structure found in the popliteal fossa. Left: No evidence of common femoral vein obstruction.  *See table(s) above for measurements and observations.    Preliminary     Procedures Procedures (including critical care  time)  Medications Ordered in ED Medications - No data to display   Initial Impression / Assessment and Plan / ED Course  I have reviewed the triage vital signs and the nursing notes.  Pertinent labs & imaging results that were available during my care of the patient were reviewed by me and considered in my medical decision making (see chart for details).       Kevin Reilly is a 78 y.o. male who presents to ED for right lower extremity pain and swelling x2 weeks after a fall.  On exam, she does have abrasion to the anterior distal lower extremity with surrounding erythema and warmth concerning for cellulitis.  DVT study done considering the extensive amount of swelling which fortunately was negative for DVT. X-rays negative. PCP follow up for recheck encouraged. Will treat with doxycycline. Reasons to return to ER discussed and all questions answered.   Patient seen by and discussed with Dr. Rogene Houston who agrees with treatment plan.   Final Clinical Impressions(s) / ED Diagnoses   Final diagnoses:  Cellulitis of right lower extremity    ED Discharge Orders         Ordered    doxycycline (VIBRAMYCIN) 100 MG capsule  2 times daily     11/07/18 1657           Taina Landry, Ozella Almond, PA-C 11/07/18 1704    Fredia Sorrow, MD 11/09/18 1555

## 2018-11-07 NOTE — Progress Notes (Signed)
Lower extremity venous has been completed.   Preliminary results in CV Proc.   Abram Sander 11/07/2018 3:50 PM

## 2018-11-07 NOTE — ED Provider Notes (Signed)
Medical screening examination/treatment/procedure(s) were conducted as a shared visit with non-physician practitioner(s) and myself.  I personally evaluated the patient during the encounter.      Patient seen by me along with physician assistant.  Patient arrived from home by POV.  Patient with significant swelling to the right lower extremity also has an abrasion on the anterior part of that extremity the distal part of the tibia that measuring approximately 5 cm.  There is a component of cellulitis.   Doppler study showed no evidence of deep vein thrombosis.  X-rays are pending.  If negative will treat for cellulitis.  Probably can be treated as an outpatient.     Fredia Sorrow, MD 11/07/18 [phone]

## 2018-11-07 NOTE — ED Notes (Signed)
An After Visit Summary was printed and given to the patient. Discharge instructions given with prescription, no further questions at this time

## 2018-11-07 NOTE — Discharge Instructions (Signed)
It was my pleasure taking care of you today!   Please take all of your antibiotics until finished!  Call your primary care doctor tomorrow to schedule a follow up appointment for recheck.   Return to ER for fever, new or worsening symptoms, any additional concerns.

## 2018-11-07 NOTE — ED Triage Notes (Addendum)
Patient arrived POV from home, AOx4 and ambulatory. Patient began to have left lower leg pain. Patient went to PCP who then instructed patient to come to ED to rule out DVT in lower right leg. Patient has no other complaints.

## 2019-05-09 ENCOUNTER — Ambulatory Visit (INDEPENDENT_AMBULATORY_CARE_PROVIDER_SITE_OTHER)
Admission: EM | Admit: 2019-05-09 | Discharge: 2019-05-09 | Disposition: A | Discharge status: Home / Self Care | Payer: Medicare Other | Source: Home / Self Care

## 2019-05-09 ENCOUNTER — Encounter (HOSPITAL_COMMUNITY): Payer: Self-pay | Admitting: Emergency Medicine

## 2019-05-09 ENCOUNTER — Encounter (HOSPITAL_COMMUNITY): Payer: Self-pay

## 2019-05-09 ENCOUNTER — Other Ambulatory Visit: Payer: Self-pay

## 2019-05-09 ENCOUNTER — Ambulatory Visit (INDEPENDENT_AMBULATORY_CARE_PROVIDER_SITE_OTHER): Payer: Medicare Other

## 2019-05-09 ENCOUNTER — Emergency Department (HOSPITAL_COMMUNITY)
Admission: EM | Admit: 2019-05-09 | Discharge: 2019-05-09 | Discharge status: Against Medical Advice | Payer: Medicare Other | Attending: Emergency Medicine | Admitting: Emergency Medicine

## 2019-05-09 DIAGNOSIS — U071 COVID-19: Secondary | ICD-10-CM | POA: Insufficient documentation

## 2019-05-09 DIAGNOSIS — I2510 Atherosclerotic heart disease of native coronary artery without angina pectoris: Secondary | ICD-10-CM | POA: Insufficient documentation

## 2019-05-09 DIAGNOSIS — J810 Acute pulmonary edema: Secondary | ICD-10-CM

## 2019-05-09 DIAGNOSIS — I10 Essential (primary) hypertension: Secondary | ICD-10-CM | POA: Insufficient documentation

## 2019-05-09 DIAGNOSIS — Z7982 Long term (current) use of aspirin: Secondary | ICD-10-CM | POA: Diagnosis not present

## 2019-05-09 DIAGNOSIS — Z79899 Other long term (current) drug therapy: Secondary | ICD-10-CM | POA: Diagnosis not present

## 2019-05-09 DIAGNOSIS — R0602 Shortness of breath: Secondary | ICD-10-CM | POA: Diagnosis present

## 2019-05-09 DIAGNOSIS — Z5320 Procedure and treatment not carried out because of patient's decision for unspecified reasons: Secondary | ICD-10-CM | POA: Insufficient documentation

## 2019-05-09 DIAGNOSIS — Z87891 Personal history of nicotine dependence: Secondary | ICD-10-CM | POA: Insufficient documentation

## 2019-05-09 DIAGNOSIS — R9389 Abnormal findings on diagnostic imaging of other specified body structures: Secondary | ICD-10-CM

## 2019-05-09 LAB — CBC
HCT: 37.7 % — ABNORMAL LOW (ref 39.0–52.0)
Hemoglobin: 11.4 g/dL — ABNORMAL LOW (ref 13.0–17.0)
MCH: 30.5 pg (ref 26.0–34.0)
MCHC: 30.2 g/dL (ref 30.0–36.0)
MCV: 100.8 fL — ABNORMAL HIGH (ref 80.0–100.0)
Platelets: 252 10*3/uL (ref 150–400)
RBC: 3.74 MIL/uL — ABNORMAL LOW (ref 4.22–5.81)
RDW: 14 % (ref 11.5–15.5)
WBC: 8.7 10*3/uL (ref 4.0–10.5)
nRBC: 0 % (ref 0.0–0.2)

## 2019-05-09 LAB — BASIC METABOLIC PANEL
Anion gap: 13 (ref 5–15)
BUN: 34 mg/dL — ABNORMAL HIGH (ref 8–23)
CO2: 18 mmol/L — ABNORMAL LOW (ref 22–32)
Calcium: 9.2 mg/dL (ref 8.9–10.3)
Chloride: 107 mmol/L (ref 98–111)
Creatinine, Ser: 1.41 mg/dL — ABNORMAL HIGH (ref 0.61–1.24)
GFR calc Af Amer: 55 mL/min — ABNORMAL LOW (ref >60)
GFR calc non Af Amer: 48 mL/min — ABNORMAL LOW (ref >60)
Glucose, Bld: 115 mg/dL — ABNORMAL HIGH (ref 70–99)
Potassium: 3.8 mmol/L (ref 3.5–5.1)
Sodium: 138 mmol/L (ref 135–145)

## 2019-05-09 NOTE — ED Triage Notes (Signed)
Pt sent by UC for further evaluation of SOB, pt tested positive for COVID 3 weeks ago, chest xray done at [facility] and sent here for CT of chest. Pt denies chest pain, pt a.o, resp e.u at this time, + cough

## 2019-05-09 NOTE — ED Triage Notes (Signed)
PT was diagnosed with COVID a few weeks ago he believes. He complains of ongoing shortness of breath, cough, and dry mouth.

## 2019-05-09 NOTE — ED Provider Notes (Signed)
[place]   MRN: [id number] DOB: January 03, 1941  Subjective:   Kevin Reilly is a 79 y.o. male presenting for persistent moderate to severe shortness of breath, ongoing dry cough and a dry mouth.  Patient was diagnosed with COVID-19 3 weeks ago.  He states that he has taken all the medications that were prescribed to him.  He denies a history of chronic lung disease, heart conditions.  He has not been using any albuterol inhaler.  No current facility-administered medications for this encounter.  Current Outpatient Medications:  .  acetaminophen (TYLENOL) 500 MG tablet, Take 1,500-2,000 mg by mouth daily as needed (pain). , Disp: , Rfl:  .  amLODipine (NORVASC) 5 MG tablet, Take 1 tablet (5 mg total) by mouth daily. (Patient taking differently: Take 5 mg by mouth at bedtime. ), Disp: 180 tablet, Rfl: 3 .  aspirin 81 MG chewable tablet, Chew 81 mg by mouth daily., Disp: , Rfl:  .  atorvastatin (LIPITOR) 20 MG tablet, Take 1 tablet (20 mg total) by mouth daily., Disp: 90 tablet, Rfl: 3 .  Coenzyme Q10 (CO Q10) 100 MG CAPS, Take 200 mg by mouth daily., Disp: 30 each, Rfl: 11 .  doxycycline (VIBRAMYCIN) 100 MG capsule, Take 1 capsule (100 mg total) by mouth 2 (two) times daily., Disp: 14 capsule, Rfl: 0 .  hydrochlorothiazide (MICROZIDE) 12.5 MG capsule, Take 1 capsule (12.5 mg total) by mouth daily. NEED OV., Disp: 90 capsule, Rfl: 0 .  HYDROcodone-acetaminophen (NORCO) 10-325 MG tablet, Take 1 tablet by mouth every 8 (eight) hours as needed. (Patient not taking: Reported on 04/23/2015), Disp: 40 tablet, Rfl: 0 .  oxyCODONE-acetaminophen (PERCOCET/ROXICET) 5-325 MG tablet, Take 1 tablet by mouth every 6 (six) hours as needed for severe pain. (Patient not taking: Reported on 07/16/2015), Disp: 30 tablet, Rfl: 0 .  ramipril (ALTACE) 10 MG capsule, Take 1 capsule (10 mg total) by mouth at bedtime., Disp: 30 capsule, Rfl: 0 .  traMADol (ULTRAM) 50 MG tablet, Take 1 tablet (50 mg total) by mouth  every 6 (six) hours as needed., Disp: 20 tablet, Rfl: 0   Allergies  Allergen Reactions  . Chocolate Hives    "welps"  . Sulfur Rash    Past Medical History:  Diagnosis Date  . Bilateral carotid artery disease ([place])   . Critical lower limb ischemia   . Hyperlipidemia    takes Atorvastatin daily  . Hypertension    takes Amlodipine and Ramipril daily  . Joint swelling   . Nocturia   . Peripheral arterial disease [facility])      Past Surgical History:  Procedure Laterality Date  . BACK SURGERY    . colonosocpy    . ENDARTERECTOMY FEMORAL Right 02/25/2015   Procedure: ENDARTERECTOMYRight  FEMORAL Endarterectomy with profundaplasty.;  Surgeon: Elam Dutch, MD;  Location: [facility];  Service: Vascular;  Laterality: Right;  . FEMORAL-POPLITEAL BYPASS GRAFT Right 02/25/2015   Procedure: BYPASS GRAFT FEMORAL-POPLITEAL ARTERY using non-reversed saphenous vein.;  Surgeon: Elam Dutch, MD;  Location: [facility];  Service: Vascular;  Laterality: Right;  . PERIPHERAL VASCULAR CATHETERIZATION N/A 01/24/2015   Procedure: Abdominal Aortogram;  Surgeon: Elam Dutch, MD;  Location: [place] CV LAB;  Service: Cardiovascular;  Laterality: N/A;    No family history on file.  Social History   Tobacco Use  . Smoking status: Former Smoker    Types: Cigarettes  . Smokeless tobacco: Never Used  Substance Use Topics  . Alcohol use: No  Alcohol/week: 0.0 standard drinks  . Drug use: No    ROS   Objective:   Vitals: BP (!) 161/92   Pulse 91   Temp 98.1 F (36.7 C) (Oral)   Resp 18   SpO2 99%   Physical Exam Constitutional:      General: He is not in acute distress.    Appearance: Normal appearance. He is well-developed. He is ill-appearing. He is not toxic-appearing or diaphoretic.  HENT:     Head: Normocephalic and atraumatic.     Right Ear: External ear normal.     Left Ear: External ear normal.     Nose: Nose normal.     Mouth/Throat:     Mouth: Mucous membranes are  moist.     Pharynx: Oropharynx is clear.  Eyes:     General: No scleral icterus.    Extraocular Movements: Extraocular movements intact.     Pupils: Pupils are equal, round, and reactive to light.  Cardiovascular:     Rate and Rhythm: Normal rate and regular rhythm.     Heart sounds: Normal heart sounds. No murmur. No friction rub. No gallop.   Pulmonary:     Effort: Pulmonary effort is normal. No tachypnea, accessory muscle usage or respiratory distress.     Breath sounds: No stridor. Decreased breath sounds (Throughout) present. No wheezing, rhonchi or rales.  Skin:    General: Skin is warm and dry.  Neurological:     Mental Status: He is alert and oriented to person, place, and time.  Psychiatric:        Mood and Affect: Mood normal. Mood is not anxious.        Behavior: Behavior normal. Behavior is not agitated.        Thought Content: Thought content normal.     DG Chest 2 View  Result Date: 05/09/2019 CLINICAL DATA:  Shortness of breath for the past few weeks. EXAM: CHEST - 2 VIEW COMPARISON:  None. FINDINGS: Mild cardiomegaly. Diffuse interstitial thickening. Focal mild patchy opacity in the peripheral right upper lobe. No pleural effusion or pneumothorax. No acute osseous abnormality. IMPRESSION: Mild cardiomegaly with diffuse interstitial pulmonary edema. Focal patchy opacity in the right upper lobe may reflect superimposed pneumonia. Electronically Signed   By: Obie Dredge M.D.   On: 05/09/2019 15:29    Assessment and Plan :   1. Acute pulmonary edema (HCC)   2. Shortness of breath   3. Abnormal chest x-ray   4. COVID-19     Unfortunately I suspect the patient is having complications from his COVID-19 infection.  X-ray shows diffuse interstitial pulmonary edema with focal patchy opacity in the right upper lobe.  At this point, counseled patient that he will need a chest CT scan for further work-up.  I will have our nursing staff transport patient to the emergency  room now.  Patient is in agreement with treatment plan.   Wallis Bamberg, PA-C 05/09/19 1551

## 2019-05-09 NOTE — Discharge Instructions (Signed)
Mr. Kevin Reilly I am very concerned that you are having complications from your COVID 19 infection. Your x-ray shows diffuse pulmonary edema and now you will need to have a chest CT scan. I will have our nursing staff transport you to the emergency room for this.

## 2019-05-09 NOTE — Discharge Instructions (Addendum)
You have chosen to leave AGAINST MEDICAL ADVICE.  He can return to the emergency department anytime for your symptoms.  Return for any shortness of breath, difficulty breathing, chest pain or any other worsening concerning symptoms.

## 2019-05-09 NOTE — ED Notes (Signed)
Pt left AMA prior to being able to give paperwork.

## 2019-05-09 NOTE — ED Provider Notes (Addendum)
[facility] EMERGENCY DEPARTMENT Provider Note   CSN: [id number] Arrival date & time: 05/09/19  1557     History Chief Complaint  Patient presents with  . COVID +  . Shortness of Breath    Kevin Reilly is a 79 y.o. male bilateral carotid artery disease, hyperlipidemia, hypertension with recent COVID-19 diagnosis about 3 weeks ago who presented for evaluation of 1 week of shortness of breath. He initially went to urgent care where he had a work-up there. He states that he had had some fatigue and tiredness throughout his COVID-19 course but states that about a week ago, he felt more short of breath. He feels like the shortness of breath is worse on exertion. No associated chest pain. He states he has had a slight nonproductive cough. He states that he went to urgent care today and they did a work-up and sent him over here. He has not had any fever, chest pain, abdominal pain. He states his legs are only swollen and does not notice any difference.  The history is provided by the patient.       Past Medical History:  Diagnosis Date  . Bilateral carotid artery disease (HCC)   . Critical lower limb ischemia   . Hyperlipidemia    takes Atorvastatin daily  . Hypertension    takes Amlodipine and Ramipril daily  . Joint swelling   . Nocturia   . Peripheral arterial disease [facility])     Patient Active Problem List   Diagnosis Date Noted  . PAD (peripheral artery disease) (HCC) 02/25/2015  . Bilateral carotid artery disease (HCC) 02/05/2015  . Essential hypertension 01/01/2014  . Critical lower limb ischemia 01/01/2014  . Bilateral lower extremity edema 01/01/2014    Past Surgical History:  Procedure Laterality Date  . BACK SURGERY    . colonosocpy    . ENDARTERECTOMY FEMORAL Right 02/25/2015   Procedure: ENDARTERECTOMYRight  FEMORAL Endarterectomy with profundaplasty.;  Surgeon: Sherren Kerns, MD;  Location: [facility] OR;  Service: Vascular;  Laterality: Right;  .  FEMORAL-POPLITEAL BYPASS GRAFT Right 02/25/2015   Procedure: BYPASS GRAFT FEMORAL-POPLITEAL ARTERY using non-reversed saphenous vein.;  Surgeon: Sherren Kerns, MD;  Location: [facility] OR;  Service: Vascular;  Laterality: Right;  . PERIPHERAL VASCULAR CATHETERIZATION N/A 01/24/2015   Procedure: Abdominal Aortogram;  Surgeon: Sherren Kerns, MD;  Location: [facility] INVASIVE CV LAB;  Service: Cardiovascular;  Laterality: N/A;       No family history on file.  Social History   Tobacco Use  . Smoking status: Former Smoker    Types: Cigarettes  . Smokeless tobacco: Never Used  Substance Use Topics  . Alcohol use: No    Alcohol/week: 0.0 standard drinks  . Drug use: No    Home Medications Prior to Admission medications   Medication Sig Start Date End Date Taking? Authorizing Provider  acetaminophen (TYLENOL) 500 MG tablet Take 1,500-2,000 mg by mouth daily as needed (pain).     [provider]  amLODipine (NORVASC) 5 MG tablet Take 1 tablet (5 mg total) by mouth daily. Patient taking differently: Take 5 mg by mouth at bedtime.  02/05/15   Runell Gess, MD  aspirin 81 MG chewable tablet Chew 81 mg by mouth daily.    [provider]  atorvastatin (LIPITOR) 20 MG tablet Take 1 tablet (20 mg total) by mouth daily. 02/13/15   Runell Gess, MD  Coenzyme Q10 (CO Q10) 100 MG CAPS Take 200 mg by mouth daily.  02/13/15   Runell Gess, MD  doxycycline (VIBRAMYCIN) 100 MG capsule Take 1 capsule (100 mg total) by mouth 2 (two) times daily. 11/07/18   Ward, Chase Picket, PA-C  hydrochlorothiazide (MICROZIDE) 12.5 MG capsule Take 1 capsule (12.5 mg total) by mouth daily. NEED OV. 03/08/16   Runell Gess, MD  HYDROcodone-acetaminophen (NORCO) 10-325 MG tablet Take 1 tablet by mouth every 8 (eight) hours as needed. Patient not taking: Reported on 04/23/2015 01/23/15   Sherren Kerns, MD  oxyCODONE-acetaminophen (PERCOCET/ROXICET) 5-325 MG tablet Take 1 tablet by mouth every 6  (six) hours as needed for severe pain. Patient not taking: Reported on 07/16/2015 06/12/15   Nickel, Carma Lair, NP  ramipril (ALTACE) 10 MG capsule Take 1 capsule (10 mg total) by mouth at bedtime. 02/28/15   Sherren Kerns, MD  traMADol (ULTRAM) 50 MG tablet Take 1 tablet (50 mg total) by mouth every 6 (six) hours as needed. 06/04/15   Nickel, Carma Lair, NP    Allergies    Chocolate and Sulfur  Review of Systems   Review of Systems  Constitutional: Negative for fever.  Respiratory: Positive for cough and shortness of breath.   Cardiovascular: Negative for chest pain.  Gastrointestinal: Negative for abdominal pain, nausea and vomiting.  Genitourinary: Negative for dysuria and hematuria.  Neurological: Negative for headaches.  All other systems reviewed and are negative.   Physical Exam Updated Vital Signs BP (!) 176/87   Pulse 95   Temp 98 F (36.7 C) (Oral)   Resp 18   SpO2 100%   Physical Exam Vitals and nursing note reviewed.  Constitutional:      Appearance: Normal appearance. He is well-developed.  HENT:     Head: Normocephalic and atraumatic.  Eyes:     General: Lids are normal.     Conjunctiva/sclera: Conjunctivae normal.     Pupils: Pupils are equal, round, and reactive to light.  Cardiovascular:     Rate and Rhythm: Normal rate and regular rhythm.     Pulses: Normal pulses.     Heart sounds: Normal heart sounds. No murmur. No friction rub. No gallop.   Pulmonary:     Effort: Pulmonary effort is normal.     Breath sounds: Rales present.     Comments: Crackles noted to bilateral lung fields approximately the mid lung field that extended distally. No evidence of respiratory distress. Able speak in full sentences without any difficulty. Abdominal:     Palpations: Abdomen is soft. Abdomen is not rigid.     Tenderness: There is no abdominal tenderness. There is no guarding.     Comments: Abdomen is soft, non-distended, non-tender. No rigidity, No guarding. No  peritoneal signs.  Musculoskeletal:        General: Normal range of motion.     Cervical back: Full passive range of motion without pain.     Comments: 2+ pitting edema with some overlying brawny appearance noted to the bilateral lower extremities from the mid tib-fib that extended distally to his shoes. Patient would not let me take off his boots to further continue to evaluate his legs so I did not get to see the rest of his feet.  Skin:    General: Skin is warm and dry.     Capillary Refill: Capillary refill takes less than 2 seconds.  Neurological:     Mental Status: He is alert and oriented to person, place, and time.     Comments: Alert and oriented x3.  Answers questions appropriately Follows commands.  Psychiatric:        Speech: Speech normal.     ED Results / Procedures / Treatments   Labs (all labs ordered are listed, but only abnormal results are displayed) Labs Reviewed  BASIC METABOLIC PANEL - Abnormal; Notable for the following components:      Result Value   CO2 18 (*)    Glucose, Bld 115 (*)    BUN 34 (*)    Creatinine, Ser 1.41 (*)    GFR calc non Af Amer 48 (*)    GFR calc Af Amer 55 (*)    All other components within normal limits  CBC - Abnormal; Notable for the following components:   RBC 3.74 (*)    Hemoglobin 11.4 (*)    HCT 37.7 (*)    MCV 100.8 (*)    All other components within normal limits    EKG None  Radiology DG Chest 2 View  Result Date: 05/09/2019 CLINICAL DATA:  Shortness of breath for the past few weeks. EXAM: CHEST - 2 VIEW COMPARISON:  None. FINDINGS: Mild cardiomegaly. Diffuse interstitial thickening. Focal mild patchy opacity in the peripheral right upper lobe. No pleural effusion or pneumothorax. No acute osseous abnormality. IMPRESSION: Mild cardiomegaly with diffuse interstitial pulmonary edema. Focal patchy opacity in the right upper lobe may reflect superimposed pneumonia. Electronically Signed   By: Obie Dredge M.D.   On:  05/09/2019 15:29    Procedures Procedures (including critical care time)  Medications Ordered in ED Medications - No data to display  ED Course  I have reviewed the triage vital signs and the nursing notes.  Pertinent labs & imaging results that were available during my care of the patient were reviewed by me and considered in my medical decision making (see chart for details).    MDM Rules/Calculators/A&P                      79 year old male who presented for evaluation of shortness of breath. He was recently diagnosed with COVID-19 3 weeks ago. Initially went to the urgent care today who noted to have pulmonary edema on his chest x-ray and sent him to the emergency department for chest CT and further evaluation. On initial ED arrival, he is afebrile, nontoxic-appearing. Vital signs are stable. No evidence of hypoxia. On exam, he has rales noted at bilateral lung fields as well as leg edema that begins in the mid tib-fib and extends distally. Patient would not let me take off his shoes to further examine his legs. He states that they were always swollen and that there is no change in them though I do have not seen any documented history. I reviewed the records from urgent care who showed pulmonary edema on his chest x-ray and was sent to get a CT of his chest. I discussed with him regarding getting an CT chest here in the emergency department the patient did not want to have that done. Patient would not let me further examine him and stated that he wanted to leave and go home. I discussed with patient that leaving now would be leaving AGAINST MEDICAL ADVICE and I discussed with him that his condition could worsen or could result and possible death. I discussed with him that I was concerned about this being related to Covid or possibly new onset heart failure given lower edema. Patient states that "this is all because of the virus and I want to go  home." I again explained my concerns and  encouraged him to stay for further treatment here in the emergency department but he declined and states that he would like to leave and understand that it would be [place]. He is alert and oriented x3 and is able to answer questions appropriately. He appears clinically sober and is able to exhibit full medical decision-making capacity. This conversation was witnessed by tech D. Sumner Boast. I instructed patient that he could return to the emergency department at any time. Patient left AGAINST MEDICAL ADVICE.  6:06 PM: I was informed by charge nurse that patient was found in the parking lot by EMS and brought back to the emergency department.  They noted patient to be slightly cyanotic on his fingers and stated that he was confused.  Patient was brought back to his room.  I discussed with patient.  He is answering questions appropriately and shows no signs of confusion.  He is alert and oriented x3 and is able to answer all questions appropriately.  He states that he does not know where his truck is parked because they wheeled him over from urgent care and he did not know where he was at in relation to urgent care.  I discussed with him regarding further treatment in the ED.  Patient refuses to get undressed and will not let us put an IV in.  He states he does not want any further treatment on all he wants to do is find his truck and go home.  This conversation was witnessed by RN Lita Mains.  I again discussed with patient that he would be leaving [place] and that his condition could result in worsening sickness or death.  Patient expresses understanding.  He does not appear confused on my exam.  He is answering all questions and is alert and oriented x3.  Patient exhibits full medical decision-making capacity.  Patient will be leaving AGAINST MEDICAL ADVICE.  Portions of this note were generated with Lobby[employer]. Dictation errors may occur despite best attempts at  proofreading.   Final Clinical Impression(s) / ED Diagnoses Final diagnoses:  SOB (shortness of breath)  COVID-19    Rx / DC Orders ED Discharge Orders    None       Volanda Napoleon, PA-C 05/09/19 1727    Volanda Napoleon, PA-C 05/09/19 1728    Volanda Napoleon, PA-C 05/09/19 Evalee Jefferson    Veryl Speak, MD 05/10/19 1505

## 2019-05-09 NOTE — ED Notes (Signed)
At bedside with EDP, Pt A/O x3, answering questions appropriately. EDP explained what could occur by leaving AMA and the possibility of death. Pt adamantly refusing any scans, IV insertions, and medications.  Pt refusing any and all treatment offered.

## 2019-05-14 DIAGNOSIS — I429 Cardiomyopathy, unspecified: Secondary | ICD-10-CM

## 2019-05-14 HISTORY — DX: Cardiomyopathy, unspecified: I42.9

## 2019-05-16 ENCOUNTER — Emergency Department (HOSPITAL_COMMUNITY): Payer: Medicare Other

## 2019-05-16 ENCOUNTER — Other Ambulatory Visit: Payer: Self-pay

## 2019-05-16 ENCOUNTER — Emergency Department (HOSPITAL_COMMUNITY)
Admission: EM | Admit: 2019-05-16 | Discharge: 2019-05-16 | Disposition: A | Discharge status: Home / Self Care | Payer: Medicare Other | Source: Home / Self Care | Attending: Emergency Medicine | Admitting: Emergency Medicine

## 2019-05-16 ENCOUNTER — Encounter (HOSPITAL_COMMUNITY): Payer: Self-pay | Admitting: Emergency Medicine

## 2019-05-16 DIAGNOSIS — I509 Heart failure, unspecified: Secondary | ICD-10-CM

## 2019-05-16 DIAGNOSIS — I110 Hypertensive heart disease with heart failure: Secondary | ICD-10-CM | POA: Insufficient documentation

## 2019-05-16 DIAGNOSIS — Z87891 Personal history of nicotine dependence: Secondary | ICD-10-CM | POA: Insufficient documentation

## 2019-05-16 DIAGNOSIS — Z7982 Long term (current) use of aspirin: Secondary | ICD-10-CM | POA: Insufficient documentation

## 2019-05-16 DIAGNOSIS — Z8616 Personal history of COVID-19: Secondary | ICD-10-CM | POA: Insufficient documentation

## 2019-05-16 DIAGNOSIS — Z79899 Other long term (current) drug therapy: Secondary | ICD-10-CM | POA: Insufficient documentation

## 2019-05-16 LAB — BASIC METABOLIC PANEL
Anion gap: 11 (ref 5–15)
BUN: 43 mg/dL — ABNORMAL HIGH (ref 8–23)
CO2: 20 mmol/L — ABNORMAL LOW (ref 22–32)
Calcium: 8.8 mg/dL — ABNORMAL LOW (ref 8.9–10.3)
Chloride: 109 mmol/L (ref 98–111)
Creatinine, Ser: 1.82 mg/dL — ABNORMAL HIGH (ref 0.61–1.24)
GFR calc Af Amer: 40 mL/min — ABNORMAL LOW (ref >60)
GFR calc non Af Amer: 35 mL/min — ABNORMAL LOW (ref >60)
Glucose, Bld: 143 mg/dL — ABNORMAL HIGH (ref 70–99)
Potassium: 3.7 mmol/L (ref 3.5–5.1)
Sodium: 140 mmol/L (ref 135–145)

## 2019-05-16 LAB — CBC
HCT: 34.8 % — ABNORMAL LOW (ref 39.0–52.0)
Hemoglobin: 10.5 g/dL — ABNORMAL LOW (ref 13.0–17.0)
MCH: 30 pg (ref 26.0–34.0)
MCHC: 30.2 g/dL (ref 30.0–36.0)
MCV: 99.4 fL (ref 80.0–100.0)
Platelets: 232 10*3/uL (ref 150–400)
RBC: 3.5 MIL/uL — ABNORMAL LOW (ref 4.22–5.81)
RDW: 14.3 % (ref 11.5–15.5)
WBC: 6.2 10*3/uL (ref 4.0–10.5)
nRBC: 0.5 % — ABNORMAL HIGH (ref 0.0–0.2)

## 2019-05-16 LAB — BRAIN NATRIURETIC PEPTIDE: B Natriuretic Peptide: 2495.6 pg/mL — ABNORMAL HIGH (ref 0.0–100.0)

## 2019-05-16 MED ORDER — AMOXICILLIN-POT CLAVULANATE 875-125 MG PO TABS: 1.0000 | ORAL_TABLET | Freq: Two times a day (BID) | ORAL | 0 refills | Status: DC

## 2019-05-16 MED ORDER — FUROSEMIDE 10 MG/ML IJ SOLN
40.0000 mg | Freq: Once | INTRAMUSCULAR | Status: DC
  Filled 2019-05-16: qty 4

## 2019-05-16 MED ORDER — FUROSEMIDE 20 MG PO TABS: 20.0000 mg | ORAL_TABLET | Freq: Every day | ORAL | 0 refills | Status: DC

## 2019-05-16 MED ORDER — AZITHROMYCIN 250 MG PO TABS: 250.0000 mg | ORAL_TABLET | Freq: Every day | ORAL | 0 refills | Status: DC

## 2019-05-16 MED ORDER — SODIUM CHLORIDE 0.9 % IV BOLUS
500.0000 mL | Freq: Once | INTRAVENOUS | Status: AC
  Administered 2019-05-16: 14:00:00 500 mL via INTRAVENOUS

## 2019-05-16 NOTE — ED Notes (Signed)
Discharge instructions reviewed along with prescriptions with the patient. He currently denies pain and reports having to get to work tonight so he is unable to stay. He verbalized his understanding of discharge. Pt is fully dressed and has removed his own peripheral line. Dressing now placed, bleeding controlled. Wheelchair used at discharge.

## 2019-05-16 NOTE — ED Triage Notes (Signed)
Pt arrives to ED from home home with complaints of on going shortness of breath since is COVID+ dx about a month go. Was here on 1/27 for same but left AMA. No acute change in SOB today, just tired of being winded all the time.

## 2019-05-16 NOTE — ED Notes (Signed)
Pt continues to remove leads and pulse ox cords. He was asked to keep the monitoring devices on.

## 2019-05-16 NOTE — Discharge Instructions (Addendum)
Your work-up shows you likely have significant or severe heart failure.  It is important to see a cardiologist as soon as possible.  We would recommend you be admitted for this.  If not there is a chance this could get worse which could lead to respiratory failure, heart attack, death, or other organ compromise.  If at any point you change your mind or worsen you are encouraged to return to the ER or call 911.

## 2019-05-16 NOTE — ED Provider Notes (Signed)
[facility] EMERGENCY DEPARTMENT Provider Note   CSN: [id number] Arrival date & time: 05/16/19  1110     History Chief Complaint  Patient presents with  . Shortness of Breath    Kevin Reilly is a 79 y.o. male.  HPI 79 year old male presents with shortness of breath. Diagnosed with Covid about a month ago. Has had dyspnea since diagnosis. Is not worse or better. Sometimes when he's short of breath he'll cough, but not often. No fevers. No chest pain. Has bilateral leg swelling that is chronic. States he's been on his feet a lot. Is not worsening. Has been losing, not gaining weight. Feels like his mouth is dry.   Past Medical History:  Diagnosis Date  . Bilateral carotid artery disease (HCC)   . Critical lower limb ischemia   . Hyperlipidemia    takes Atorvastatin daily  . Hypertension    takes Amlodipine and Ramipril daily  . Joint swelling   . Nocturia   . Peripheral arterial disease [name])     Patient Active Problem List   Diagnosis Date Noted  . PAD (peripheral artery disease) (HCC) 02/25/2015  . Bilateral carotid artery disease (HCC) 02/05/2015  . Essential hypertension 01/01/2014  . Critical lower limb ischemia 01/01/2014  . Bilateral lower extremity edema 01/01/2014    Past Surgical History:  Procedure Laterality Date  . BACK SURGERY    . colonosocpy    . ENDARTERECTOMY FEMORAL Right 02/25/2015   Procedure: ENDARTERECTOMYRight  FEMORAL Endarterectomy with profundaplasty.;  Surgeon: Sherren Kerns, MD;  Location: [facility] OR;  Service: Vascular;  Laterality: Right;  . FEMORAL-POPLITEAL BYPASS GRAFT Right 02/25/2015   Procedure: BYPASS GRAFT FEMORAL-POPLITEAL ARTERY using non-reversed saphenous vein.;  Surgeon: Sherren Kerns, MD;  Location: [facility] OR;  Service: Vascular;  Laterality: Right;  . PERIPHERAL VASCULAR CATHETERIZATION N/A 01/24/2015   Procedure: Abdominal Aortogram;  Surgeon: Sherren Kerns, MD;  Location: [facility] INVASIVE CV LAB;  Service:  Cardiovascular;  Laterality: N/A;       History reviewed. No pertinent family history.  Social History   Tobacco Use  . Smoking status: Former Smoker    Types: Cigarettes  . Smokeless tobacco: Never Used  Substance Use Topics  . Alcohol use: No    Alcohol/week: 0.0 standard drinks  . Drug use: No    Home Medications Prior to Admission medications   Medication Sig Start Date End Date Taking? Authorizing Provider  acetaminophen (TYLENOL) 500 MG tablet Take 1,500-2,000 mg by mouth daily as needed (pain).    Yes [provider]  amLODipine (NORVASC) 5 MG tablet Take 1 tablet (5 mg total) by mouth daily. Patient not taking: Reported on 05/16/2019 02/05/15   Runell Gess, MD  amoxicillin-clavulanate (AUGMENTIN) 875-125 MG tablet Take 1 tablet by mouth 2 (two) times daily. One po bid x 7 days 05/16/19   Pricilla Loveless, MD  aspirin 81 MG chewable tablet Chew 81 mg by mouth daily.    [provider]  atorvastatin (LIPITOR) 20 MG tablet Take 1 tablet (20 mg total) by mouth daily. Patient not taking: Reported on 05/16/2019 02/13/15   Runell Gess, MD  azithromycin (ZITHROMAX) 250 MG tablet Take 1 tablet (250 mg total) by mouth daily. Take first 2 tablets together, then 1 every day until finished. 05/16/19   Pricilla Loveless, MD  Coenzyme Q10 (CO Q10) 100 MG CAPS Take 200 mg by mouth daily. Patient not taking: Reported on 05/16/2019 02/13/15   Nanetta Batty  J, MD  furosemide (LASIX) 20 MG tablet Take 1 tablet (20 mg total) by mouth daily. 05/16/19   Pricilla Loveless, MD  hydrochlorothiazide (MICROZIDE) 12.5 MG capsule Take 1 capsule (12.5 mg total) by mouth daily. NEED OV. Patient not taking: Reported on 05/16/2019 03/08/16   Runell Gess, MD  HYDROcodone-acetaminophen [facility]) 10-325 MG tablet Take 1 tablet by mouth every 8 (eight) hours as needed. Patient not taking: Reported on 04/23/2015 01/23/15   Sherren Kerns, MD  oxyCODONE-acetaminophen (PERCOCET/ROXICET) 5-325 MG  tablet Take 1 tablet by mouth every 6 (six) hours as needed for severe pain. Patient not taking: Reported on 07/16/2015 06/12/15   Nickel, Carma Lair, NP  ramipril (ALTACE) 10 MG capsule Take 1 capsule (10 mg total) by mouth at bedtime. Patient not taking: Reported on 05/16/2019 02/28/15   Sherren Kerns, MD  traMADol (ULTRAM) 50 MG tablet Take 1 tablet (50 mg total) by mouth every 6 (six) hours as needed. Patient not taking: Reported on 05/16/2019 06/04/15   Nickel, Carma Lair, NP    Allergies    Chocolate and Sulfur  Review of Systems   Review of Systems  Constitutional: Negative for fever.  Respiratory: Positive for cough and shortness of breath.   Cardiovascular: Positive for leg swelling. Negative for chest pain.  All other systems reviewed and are negative.   Physical Exam Updated Vital Signs BP 138/79   Pulse 86   Temp (!) 97.4 F (36.3 C) (Oral)   Resp 20   SpO2 99%   Physical Exam Vitals and nursing note reviewed.  Constitutional:      Appearance: He is well-developed.  HENT:     Head: Normocephalic and atraumatic.     Right Ear: External ear normal.     Left Ear: External ear normal.     Nose: Nose normal.  Eyes:     General:        Right eye: No discharge.        Left eye: No discharge.  Cardiovascular:     Rate and Rhythm: Normal rate and regular rhythm.     Heart sounds: Normal heart sounds.  Pulmonary:     Effort: Pulmonary effort is normal. No tachypnea or accessory muscle usage.     Breath sounds: Normal breath sounds.  Abdominal:     Palpations: Abdomen is soft.     Tenderness: There is no abdominal tenderness.  Musculoskeletal:     Cervical back: Neck supple.     Right lower leg: Edema present.     Left lower leg: Edema present.     Comments: Pitting edema in BLE, symmetric, up to knees  Skin:    General: Skin is warm and dry.  Neurological:     Mental Status: He is alert.  Psychiatric:        Mood and Affect: Mood is not anxious.     ED  Results / Procedures / Treatments   Labs (all labs ordered are listed, but only abnormal results are displayed) Labs Reviewed  BASIC METABOLIC PANEL - Abnormal; Notable for the following components:      Result Value   CO2 20 (*)    Glucose, Bld 143 (*)    BUN 43 (*)    Creatinine, Ser 1.82 (*)    Calcium 8.8 (*)    GFR calc non Af Amer 35 (*)    GFR calc Af Amer 40 (*)    All other components within normal limits  CBC -  Abnormal; Notable for the following components:   RBC 3.50 (*)    Hemoglobin 10.5 (*)    HCT 34.8 (*)    nRBC 0.5 (*)    All other components within normal limits  BRAIN NATRIURETIC PEPTIDE - Abnormal; Notable for the following components:   B Natriuretic Peptide 2,495.6 (*)    All other components within normal limits    EKG EKG Interpretation  Date/Time:  Wednesday May 16 2019 11:24:26 EST Ventricular Rate:  90 PR Interval:  186 QRS Duration: 108 QT Interval:  352 QTC Calculation: 430 R Axis:   93 Text Interpretation: Normal sinus rhythm Rightward axis ST-t wave abnormality Abnormal ECG Confirmed by Carmin Muskrat [phone]) on 05/16/2019 11:35:31 AM   Radiology DG Chest Portable 1 View  Result Date: 05/16/2019 CLINICAL DATA:  Shortness of breath for 1 month. COVID diagnosis 1 month ago. EXAM: PORTABLE CHEST 1 VIEW COMPARISON:  Chest x-ray dated 05/09/2019 FINDINGS: Chronic cardiomegaly. New slight distention of the azygos vein suggesting elevated right heart pressure. The pulmonary vascularity appears normal. There is further accentuation of the interstitial markings in the left midzone peripherally. Faint infiltrate in the right midzone has slightly progressed adjacent to the minor fissure. No effusions. No acute bone abnormality. Aortic atherosclerosis. IMPRESSION: 1. Slight progression of faint infiltrate in the right midzone. 2. New slight distention of the azygos vein. 3. Slight progression of interstitial disease in the left midzone. 4.  Aortic  Atherosclerosis (ICD10-I70.0).a the Electronically Signed   By: Lorriane Shire M.D.   On: 05/16/2019 12:32    Procedures Procedures (including critical care time)  Medications Ordered in ED Medications  furosemide (LASIX) injection 40 mg (40 mg Intravenous Refused 05/16/19 1440)  sodium chloride 0.9 % bolus 500 mL (0 mLs Intravenous Stopped 05/16/19 1418)    ED Course  I have reviewed the triage vital signs and the nursing notes.  Pertinent labs & imaging results that were available during my care of the patient were reviewed by me and considered in my medical decision making (see chart for details).    MDM Rules/Calculators/A&P                      Patient's chest x-ray is equivocal in multiple areas.  Possible pneumonia.  I think with his shortness of breath is reasonable to treat for pneumonia though I had initially recommended a CT.  Unfortunately, the patient is now wanting to go.  He seems to be anxious to get out.  He is not hypoxic or really having increased work of breathing at this time.  Eventually BNP has come back and is quite elevated.  Fluids were stopped, he had only been given about 100 cc.  At this point, overall he probably has significant heart failure.  While he is not hypoxic I recommended he come in for admission given the worsening renal function.  He declines and seems understand that he could suffer worsening organ function or death.  I will prescribe Lasix though he refused the IV Lasix.  Discussed importance of following up with cardiology.  Sadly, he does not seem to want to do any of the more immediate interventions.  He does not appear confused and appears capable of understanding risks/benefits of leaving. Final Clinical Impression(s) / ED Diagnoses Final diagnoses:  Acute congestive heart failure, unspecified heart failure type ([place])    Rx / DC Orders ED Discharge Orders         Ordered  furosemide (LASIX) 20 MG tablet  Daily     05/16/19 1447     amoxicillin-clavulanate (AUGMENTIN) 875-125 MG tablet  2 times daily     05/16/19 1447    azithromycin (ZITHROMAX) 250 MG tablet  Daily     05/16/19 1447           Pricilla Loveless, MD 05/16/19 [phone]

## 2019-05-16 NOTE — ED Notes (Signed)
Ambulated pt. In room with SPO2 attached, maintained 97-100%.  Upon sitting down has a brief episode of spo2 at 92%, but quickly reverted back to 97-100%.

## 2019-05-16 NOTE — ED Notes (Signed)
Per EDP d/c normal saline IV infusion due to BNP.

## 2019-05-16 NOTE — ED Notes (Signed)
Pt placed on precautions for previous + Covid Test.   Radiology at bedside for chest xray.

## 2019-05-16 NOTE — ED Notes (Signed)
Pt. Is fully dressed sitting in chair in room, removed all leads, stickers, self removed IV catheter from site, falling asleep while sitting in chair, waiting on discharge paperwork

## 2019-05-19 ENCOUNTER — Emergency Department (HOSPITAL_COMMUNITY): Payer: Medicare Other

## 2019-05-19 ENCOUNTER — Other Ambulatory Visit: Payer: Self-pay

## 2019-05-19 ENCOUNTER — Inpatient Hospital Stay (HOSPITAL_COMMUNITY)
Admission: EM | Admit: 2019-05-19 | Discharge: 2019-05-19 | DRG: 292 | Discharge status: Against Medical Advice | Payer: Medicare Other | Attending: Cardiology | Admitting: Cardiology

## 2019-05-19 ENCOUNTER — Encounter (HOSPITAL_COMMUNITY): Payer: Self-pay

## 2019-05-19 DIAGNOSIS — Z5329 Procedure and treatment not carried out because of patient's decision for other reasons: Secondary | ICD-10-CM | POA: Diagnosis present

## 2019-05-19 DIAGNOSIS — R600 Localized edema: Secondary | ICD-10-CM | POA: Diagnosis present

## 2019-05-19 DIAGNOSIS — I10 Essential (primary) hypertension: Secondary | ICD-10-CM | POA: Diagnosis present

## 2019-05-19 DIAGNOSIS — Z8616 Personal history of COVID-19: Secondary | ICD-10-CM

## 2019-05-19 DIAGNOSIS — Z87891 Personal history of nicotine dependence: Secondary | ICD-10-CM | POA: Diagnosis not present

## 2019-05-19 DIAGNOSIS — N179 Acute kidney failure, unspecified: Secondary | ICD-10-CM | POA: Diagnosis present

## 2019-05-19 DIAGNOSIS — E785 Hyperlipidemia, unspecified: Secondary | ICD-10-CM | POA: Diagnosis present

## 2019-05-19 DIAGNOSIS — I739 Peripheral vascular disease, unspecified: Secondary | ICD-10-CM | POA: Diagnosis present

## 2019-05-19 DIAGNOSIS — I110 Hypertensive heart disease with heart failure: Principal | ICD-10-CM | POA: Diagnosis present

## 2019-05-19 DIAGNOSIS — I509 Heart failure, unspecified: Secondary | ICD-10-CM | POA: Diagnosis present

## 2019-05-19 DIAGNOSIS — I5041 Acute combined systolic (congestive) and diastolic (congestive) heart failure: Secondary | ICD-10-CM

## 2019-05-19 HISTORY — DX: Heart failure, unspecified: I50.9

## 2019-05-19 LAB — CBC
HCT: 35.9 % — ABNORMAL LOW (ref 39.0–52.0)
Hemoglobin: 11.3 g/dL — ABNORMAL LOW (ref 13.0–17.0)
MCH: 30.3 pg (ref 26.0–34.0)
MCHC: 31.5 g/dL (ref 30.0–36.0)
MCV: 96.2 fL (ref 80.0–100.0)
Platelets: 209 10*3/uL (ref 150–400)
RBC: 3.73 MIL/uL — ABNORMAL LOW (ref 4.22–5.81)
RDW: 14.2 % (ref 11.5–15.5)
WBC: 7.1 10*3/uL (ref 4.0–10.5)
nRBC: 0.4 % — ABNORMAL HIGH (ref 0.0–0.2)

## 2019-05-19 LAB — BASIC METABOLIC PANEL
Anion gap: 14 (ref 5–15)
BUN: 46 mg/dL — ABNORMAL HIGH (ref 8–23)
CO2: 20 mmol/L — ABNORMAL LOW (ref 22–32)
Calcium: 9 mg/dL (ref 8.9–10.3)
Chloride: 106 mmol/L (ref 98–111)
Creatinine, Ser: 1.8 mg/dL — ABNORMAL HIGH (ref 0.61–1.24)
GFR calc Af Amer: 41 mL/min — ABNORMAL LOW (ref >60)
GFR calc non Af Amer: 35 mL/min — ABNORMAL LOW (ref >60)
Glucose, Bld: 129 mg/dL — ABNORMAL HIGH (ref 70–99)
Potassium: 3.4 mmol/L — ABNORMAL LOW (ref 3.5–5.1)
Sodium: 140 mmol/L (ref 135–145)

## 2019-05-19 LAB — RESPIRATORY PANEL BY RT PCR (FLU A&B, COVID)
Influenza A by PCR: NEGATIVE
Influenza B by PCR: NEGATIVE
SARS Coronavirus 2 by RT PCR: NEGATIVE

## 2019-05-19 LAB — TROPONIN I (HIGH SENSITIVITY)
Troponin I (High Sensitivity): 199 ng/L (ref <18)
Troponin I (High Sensitivity): 161 ng/L (ref <18)

## 2019-05-19 LAB — BRAIN NATRIURETIC PEPTIDE: B Natriuretic Peptide: 1756.3 pg/mL — ABNORMAL HIGH (ref 0.0–100.0)

## 2019-05-19 MED ORDER — LORAZEPAM 1 MG PO TABS
1.0000 mg | ORAL_TABLET | Freq: Once | ORAL | Status: AC
  Administered 2019-05-19: 1 mg via ORAL
  Filled 2019-05-19: qty 1

## 2019-05-19 MED ORDER — FUROSEMIDE 10 MG/ML IJ SOLN
60.0000 mg | Freq: Once | INTRAMUSCULAR | Status: AC
  Administered 2019-05-19: 60 mg via INTRAVENOUS
  Filled 2019-05-19: qty 6

## 2019-05-19 NOTE — ED Notes (Signed)
Upon entering the room the patient complained of back pain. The patient was repositioned to a position of comfort and an EKG ws obtained because he was presenting with tachycardia. EKG was given to ER Physician Dr. Silverio Lay for evaluation.

## 2019-05-19 NOTE — ED Notes (Signed)
Attempted to give report to 3E, charge nurse to call back.

## 2019-05-19 NOTE — ED Triage Notes (Signed)
Pt reports worsening Sob since the last time he was seen about 2 weeks ago. Pt told he had heart failure and has yet to follow up with cardiologist. Pt a.o, resp e.u at this time.

## 2019-05-19 NOTE — ED Triage Notes (Signed)
Pt tested positive for COVID last month

## 2019-05-19 NOTE — ED Provider Notes (Signed)
[facility] EMERGENCY DEPARTMENT Provider Note   CSN: [id number] Arrival date & time: 05/19/19  1611     History Chief Complaint  Patient presents with  . Shortness of Breath    Kevin Reilly is a 79 y.o. male.  Patient is a 79 year old with past medical history of peripheral vascular disease, hypertension, hyperlipidemia presenting to the emergency department for shortness of breath.  This is patient's third or fourth visit in the last week or so for the same.  Patient was last diagnosed with what appeared to be new onset congestive heart failure just 3 days ago at his visit.  He was advised to be admitted to the hospital.  He has a history of leaving AMA and he refused to be admitted to the hospital.  He was sent home with antibiotics for question of pneumonia as well as furosemide.  Patient reports he has been taking all of his medications without missing doses but that his shortness of breath is feeling like it is getting worse.  He denies any chest pain.  He reports that he did not follow-up with cardiology as he was instructed because "I have been too busy".  He reports the swelling in his legs is getting worse.  He has orthopnea and dyspnea on exertion.        Past Medical History:  Diagnosis Date  . Bilateral carotid artery disease ([place])   . Critical lower limb ischemia   . Hyperlipidemia    takes Atorvastatin daily  . Hypertension    takes Amlodipine and Ramipril daily  . Joint swelling   . Nocturia   . Peripheral arterial disease [facility])     Patient Active Problem List   Diagnosis Date Noted  . PAD (peripheral artery disease) ([place]) 02/25/2015  . Bilateral carotid artery disease ([place]) 02/05/2015  . Essential hypertension 01/01/2014  . Critical lower limb ischemia 01/01/2014  . Bilateral lower extremity edema 01/01/2014    Past Surgical History:  Procedure Laterality Date  . BACK SURGERY    . colonosocpy    . ENDARTERECTOMY FEMORAL Right  02/25/2015   Procedure: ENDARTERECTOMYRight  FEMORAL Endarterectomy with profundaplasty.;  Surgeon: Elam Dutch, MD;  Location: [place];  Service: Vascular;  Laterality: Right;  . FEMORAL-POPLITEAL BYPASS GRAFT Right 02/25/2015   Procedure: BYPASS GRAFT FEMORAL-POPLITEAL ARTERY using non-reversed saphenous vein.;  Surgeon: Elam Dutch, MD;  Location: [place];  Service: Vascular;  Laterality: Right;  . PERIPHERAL VASCULAR CATHETERIZATION N/A 01/24/2015   Procedure: Abdominal Aortogram;  Surgeon: Elam Dutch, MD;  Location: [place] CV LAB;  Service: Cardiovascular;  Laterality: N/A;       No family history on file.  Social History   Tobacco Use  . Smoking status: Former Smoker    Types: Cigarettes  . Smokeless tobacco: Never Used  Substance Use Topics  . Alcohol use: No    Alcohol/week: 0.0 standard drinks  . Drug use: No    Home Medications Prior to Admission medications   Medication Sig Start Date End Date Taking? Authorizing Provider  acetaminophen (TYLENOL) 500 MG tablet Take 1,500-2,000 mg by mouth daily as needed (pain).     [provider]  amLODipine (NORVASC) 5 MG tablet Take 1 tablet (5 mg total) by mouth daily. Patient not taking: Reported on 05/16/2019 02/05/15   Lorretta Harp, MD  amoxicillin-clavulanate (AUGMENTIN) 875-125 MG tablet Take 1 tablet by mouth 2 (two) times daily. One po bid x 7 days 05/16/19  Pricilla Loveless, MD  aspirin 81 MG chewable tablet Chew 81 mg by mouth daily.    [provider]  atorvastatin (LIPITOR) 20 MG tablet Take 1 tablet (20 mg total) by mouth daily. Patient not taking: Reported on 05/16/2019 02/13/15   Runell Gess, MD  azithromycin (ZITHROMAX) 250 MG tablet Take 1 tablet (250 mg total) by mouth daily. Take first 2 tablets together, then 1 every day until finished. 05/16/19   Pricilla Loveless, MD  Coenzyme Q10 (CO Q10) 100 MG CAPS Take 200 mg by mouth daily. Patient not taking: Reported on 05/16/2019 02/13/15    Runell Gess, MD  furosemide (LASIX) 20 MG tablet Take 1 tablet (20 mg total) by mouth daily. 05/16/19   Pricilla Loveless, MD  hydrochlorothiazide (MICROZIDE) 12.5 MG capsule Take 1 capsule (12.5 mg total) by mouth daily. NEED OV. Patient not taking: Reported on 05/16/2019 03/08/16   Runell Gess, MD  HYDROcodone-acetaminophen [facility]) 10-325 MG tablet Take 1 tablet by mouth every 8 (eight) hours as needed. Patient not taking: Reported on 04/23/2015 01/23/15   Sherren Kerns, MD  oxyCODONE-acetaminophen (PERCOCET/ROXICET) 5-325 MG tablet Take 1 tablet by mouth every 6 (six) hours as needed for severe pain. Patient not taking: Reported on 07/16/2015 06/12/15   Nickel, Carma Lair, NP  ramipril (ALTACE) 10 MG capsule Take 1 capsule (10 mg total) by mouth at bedtime. Patient not taking: Reported on 05/16/2019 02/28/15   Sherren Kerns, MD  traMADol (ULTRAM) 50 MG tablet Take 1 tablet (50 mg total) by mouth every 6 (six) hours as needed. Patient not taking: Reported on 05/16/2019 06/04/15   Nickel, Carma Lair, NP    Allergies    Chocolate and Sulfur  Review of Systems   Review of Systems  Constitutional: Negative for chills and fever.  HENT: Negative for congestion, mouth sores and sore throat.   Respiratory: Positive for cough and shortness of breath. Negative for chest tightness.   Cardiovascular: Positive for leg swelling. Negative for chest pain and palpitations.  Gastrointestinal: Negative for abdominal pain, nausea and vomiting.  Genitourinary: Negative for dysuria.  Musculoskeletal: Negative for back pain.  Skin: Negative for rash and wound.  Neurological: Negative for dizziness and headaches.  Hematological: Does not bruise/bleed easily.    Physical Exam Updated Vital Signs BP (!) 157/82   Pulse 92   Temp (!) 97.4 F (36.3 C) (Oral)   Resp (!) 23   SpO2 100%   Physical Exam Vitals and nursing note reviewed.  Constitutional:      General: He is not in acute distress.     Appearance: Normal appearance. He is not ill-appearing, toxic-appearing or diaphoretic.     Interventions: He is not intubated.    Comments: Thin, elderly male  HENT:     Head: Normocephalic.     Mouth/Throat:     Mouth: Mucous membranes are moist.  Eyes:     Conjunctiva/sclera: Conjunctivae normal.     Pupils: Pupils are equal, round, and reactive to light.  Cardiovascular:     Rate and Rhythm: Normal rate and regular rhythm.  Pulmonary:     Effort: Pulmonary effort is normal. No bradypnea, accessory muscle usage or respiratory distress. He is not intubated.     Breath sounds: No stridor. Examination of the right-lower field reveals rales. Examination of the left-lower field reveals rales. Rales present.  Musculoskeletal:     Right lower leg: No tenderness. Edema present.     Left lower leg: No  tenderness. Edema present.     Comments: 4+ pitting and weeping edema bilaterally  Skin:    General: Skin is dry.  Neurological:     Mental Status: He is alert.  Psychiatric:        Mood and Affect: Mood normal.     Comments: anxious     ED Results / Procedures / Treatments   Labs (all labs ordered are listed, but only abnormal results are displayed) Labs Reviewed  RESPIRATORY PANEL BY RT PCR (FLU A&B, COVID)  BASIC METABOLIC PANEL  CBC  BRAIN NATRIURETIC PEPTIDE  TROPONIN I (HIGH SENSITIVITY)    EKG None  Radiology No results found.  Procedures Procedures (including critical care time)  Medications Ordered in ED Medications  furosemide (LASIX) injection 60 mg (has no administration in time range)  LORazepam (ATIVAN) tablet 1 mg (has no administration in time range)    ED Course  I have reviewed the triage vital signs and the nursing notes.  Pertinent labs & imaging results that were available during my care of the patient were reviewed by me and considered in my medical decision making (see chart for details).  Clinical Course as of May 18 2336  Sat May 19, 2019    1818 Patient was 1 month of orthopnea, dyspnea on exertion and lower extremity edema.  Appears to be in new onset CHF.  Seen for the same 3 days ago and started on outpatient Lasix because he refused to be admitted.  Reports taking the Lasix and has not improved.  Today he is short of breath but not hypoxic, BNP of 1756.  He is not having chest pain.  Troponin is 199.  Has CKD at baseline, unchanged   [KM]  1825 Spoke with Tresa Endo from Cardiology who consult on and admit patient.    [KM]    Clinical Course User Index [KM] Jeral Pinch   MDM Rules/Calculators/A&P                      The patient appears reasonably stabilized for admission considering the current resources, flow, and capabilities available in the ED at this time, and I doubt any other [facility] requiring further screening and/or treatment in the ED prior to admission.  Final Clinical Impression(s) / ED Diagnoses Final diagnoses:  None    Rx / DC Orders ED Discharge Orders    None       Jeral Pinch 05/19/19 2339    Charlynne Pander, MD 05/23/19 [phone]

## 2019-05-19 NOTE — ED Notes (Signed)
Upon entry into the room to take the patient upstairs, the patient was dressed and stated he was leaving. The patient was educated regarding the risks of leaving. The patient is alert and oriented x4. Wife is coming to talk with him in person and convince him to stay because when given the phone to speak with her, he threw it.

## 2019-05-19 NOTE — ED Notes (Signed)
Admitting cardiologist notified patient stated he does not want to stay and is aware of the consequences of leaving. Patient is being signed out AMA.

## 2019-05-20 NOTE — Consult Note (Signed)
Cardiology Consultation:   Patient ID: [name] MRN: [id number]; DOB: 1940-12-28  Admit date: 05/19/2019 Date of Consult: 05/20/2019  Primary Care Provider: Patient, No Pcp Per Primary Cardiologist: No primary care provider on file.  Primary Electrophysiologist:  None    Patient Profile:   Kevin Reilly is a 79 y.o. male with a hx of HTN, severe PAD, and carotid artery stenosis who is being seen today for the evaluation of dyspnea at the request of Dr. Regenia Skeeter.   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 For the past month, he has developed DOE, orthopnea, and lower extremity edema. He was seen in the ED for this concern on 2/3 and recommended admission which he declined. He was discharged from the ED on Lasix 20 mg daily. He presented today with the same concern and was found to have significant volume overload with AKI. He received IV Lasix 60 mg with good response.   Heart Pathway Score:     Past Medical History:  Diagnosis Date  . Bilateral carotid artery disease ([place])   . CHF (congestive heart failure) ([place])   . Critical lower limb ischemia   . Hyperlipidemia    takes Atorvastatin daily  . Hypertension    takes Amlodipine and Ramipril daily  . Joint swelling   . Nocturia   . Peripheral arterial disease [facility])     Past Surgical History:  Procedure Laterality Date  . BACK SURGERY    . colonosocpy    . ENDARTERECTOMY FEMORAL Right 02/25/2015   Procedure: ENDARTERECTOMYRight  FEMORAL Endarterectomy with profundaplasty.;  Surgeon: Elam Dutch, MD;  Location: [place];  Service: Vascular;  Laterality: Right;  . FEMORAL-POPLITEAL BYPASS GRAFT Right 02/25/2015   Procedure: BYPASS GRAFT FEMORAL-POPLITEAL ARTERY using non-reversed saphenous vein.;  Surgeon: Elam Dutch, MD;  Location: [place];  Service: Vascular;  Laterality: Right;  . PERIPHERAL VASCULAR  CATHETERIZATION N/A 01/24/2015   Procedure: Abdominal Aortogram;  Surgeon: Elam Dutch, MD;  Location: [place] [facility];  Service: Cardiovascular;  Laterality: N/A;     Home Medications:  Prior to Admission medications   Medication Sig Start Date End Date Taking? Authorizing Provider  acetaminophen (TYLENOL) 500 MG tablet Take 1,500 mg by mouth 2 (two) times daily as needed for headache.    Yes [provider]  amoxicillin-clavulanate (AUGMENTIN) 875-125 MG tablet Take 1 tablet by mouth 2 (two) times daily. One po bid x 7 days Patient taking differently: Take 1 tablet by mouth 2 (two) times daily.  05/16/19  Yes Sherwood Gambler, MD  azithromycin (ZITHROMAX) 250 MG tablet Take 1 tablet (250 mg total) by mouth daily. Take first 2 tablets together, then 1 every day until finished. Patient taking differently: Take 250-500 mg by mouth See admin instructions. Take 2 tablets (500 mg) by mouth 1st day, then take 1 tablet (250 mg) daily on days 2-5 05/16/19  Yes Sherwood Gambler, MD  furosemide (LASIX) 20 MG tablet Take 1 tablet (20 mg total) by mouth daily. 05/16/19  Yes Sherwood Gambler, MD  amLODipine (NORVASC) 5 MG tablet Take 1 tablet (5 mg total) by mouth daily. Patient not taking: Reported on 05/16/2019 02/05/15   Lorretta Harp, MD  atorvastatin (LIPITOR) 20 MG tablet Take 1 tablet (20 mg total) by mouth daily. Patient not taking: Reported on 05/16/2019 02/13/15   Lorretta Harp, MD  hydrochlorothiazide (MICROZIDE)  12.5 MG capsule Take 1 capsule (12.5 mg total) by mouth daily. NEED OV. Patient not taking: Reported on 05/16/2019 03/08/16   Runell Gess, MD  ramipril (ALTACE) 10 MG capsule Take 1 capsule (10 mg total) by mouth at bedtime. Patient not taking: Reported on 05/16/2019 02/28/15   Sherren Kerns, MD    Inpatient Medications: Scheduled Meds:  Continuous Infusions:  PRN Meds:   Allergies:    Allergies  Allergen Reactions  . Chocolate Hives  . Sulfur Rash     Social History:   Social History   Socioeconomic History  . Marital status: Married    Spouse name: Not on file  . Number of children: Not on file  . Years of education: Not on file  . Highest education level: Not on file  Occupational History  . Not on file  Tobacco Use  . Smoking status: Former Smoker    Types: Cigarettes  . Smokeless tobacco: Never Used  Substance and Sexual Activity  . Alcohol use: No    Alcohol/week: 0.0 standard drinks  . Drug use: No  . Sexual activity: Not Currently    Birth control/protection: None  Other Topics Concern  . Not on file  Social History Narrative  . Not on file   Social Determinants of Health   Financial Resource Strain:   . Difficulty of Paying Living Expenses: Not on file  Food Insecurity:   . Worried About Programme researcher, broadcasting/film/video in the Last Year: Not on file  . Ran Out of Food in the Last Year: Not on file  Transportation Needs:   . Lack of Transportation (Medical): Not on file  . Lack of Transportation (Non-Medical): Not on file  Physical Activity:   . Days of Exercise per Week: Not on file  . Minutes of Exercise per Session: Not on file  Stress:   . Feeling of Stress : Not on file  Social Connections:   . Frequency of Communication with Friends and Family: Not on file  . Frequency of Social Gatherings with Friends and Family: Not on file  . Attends Religious Services: Not on file  . Active Member of Clubs or Organizations: Not on file  . Attends Banker Meetings: Not on file  . Marital Status: Not on file  Intimate Partner Violence:   . Fear of Current or Ex-Partner: Not on file  . Emotionally Abused: Not on file  . Physically Abused: Not on file  . Sexually Abused: Not on file    Family History:   Non-pertinent   ROS:  Please see the history of present illness.  All other ROS reviewed and negative.     Physical Exam/Data:   Vitals:   05/19/19 1845 05/19/19 1900 05/19/19 1915 05/19/19 2100   BP: (!) 145/72 (!) 145/81 (!) 152/84 (!) 168/86  Pulse: 95 92 98 (!) 109  Resp: (!) 25 (!) 22 20 (!) 25  Temp:      TempSrc:      SpO2: 98%  96% 98%    Intake/Output Summary (Last 24 hours) at 05/20/2019 0419 Last data filed at 05/19/2019 2132 Gross per 24 hour  Intake --  Output 2200 ml  Net -2200 ml   Last 3 Weights 02/21/2015 02/05/2015 01/24/2015  Weight (lbs) 181 lb 9.6 oz 184 lb 12.8 oz 179 lb  Weight (kg) 82.373 kg 83.825 kg 81.194 kg  Some encounter information is confidential and restricted. Go to Review Flowsheets activity to see all data.  There is no height or weight on file to calculate BMI.  General:   Elderly gentleman, appears fatigue, no distress HEENT: normal Lymph: no adenopathy Neck: JVD to earlobe Endocrine:  No thryomegaly Vascular: No carotid bruits; FA pulses 2+ bilaterally without bruits  Cardiac:  normal S1, S2; RRR; 3/6 systolic murmur Lungs: bibasilar crackles Abd: soft, nontender, no hepatomegaly  Ext severe bilateral pitting edema to thighs Musculoskeletal:  No deformities, BUE and BLE strength normal and equal Skin: warm and dry  Neuro:  CNs 2-12 intact, no focal abnormalities noted Psych:  Normal affect   EKG:  The EKG was personally reviewed and demonstrates:  Sinus tachycardia with LVH, anterior Q waves, and T wave flattening Telemetry:  Telemetry was personally reviewed and demonstrates:  Sinus tachycardia   Relevant CV Studies:  Nuclear stress 2016 Myocardial perfusion is normal.   The study is normal.   This is a low risk study.  Overall left ventricular systolic function was abnormal.    LV cavity size is mildly enlarged.  Nuclear stress EF:  49%.  The left ventricular ejection fraction is mildly decreased (45-54%).   There is no prior study for comparison.  Peripheral LE angiogram 2016 #1 occlusion right common femoral artery and right superficial femoral artery with reconstitution of the below-knee popliteal artery and three-vessel  runoff to the right foot  #2 high-grade stenosis left common femoral bifurcation , left superficial femoral artery occlusion with reconstitution of the below-knee popliteal artery and three-vessel runoff to the left foot  Laboratory Data:  High Sensitivity Troponin:   Recent Labs  Lab 05/19/19 1641 05/19/19 1848  TROPONINIHS 199* 161*     Chemistry Recent Labs  Lab 05/16/19 1136 05/19/19 1641  NA 140 140  K 3.7 3.4*  CL 109 106  CO2 20* 20*  GLUCOSE 143* 129*  BUN 43* 46*  CREATININE 1.82* 1.80*  CALCIUM 8.8* 9.0  GFRNONAA 35* 35*  GFRAA 40* 41*  ANIONGAP 11 14    No results for input(s): PROT, ALBUMIN, AST, ALT, ALKPHOS, BILITOT in the last 168 hours. Hematology Recent Labs  Lab 05/16/19 1136 05/19/19 1641  WBC 6.2 7.1  RBC 3.50* 3.73*  HGB 10.5* 11.3*  HCT 34.8* 35.9*  MCV 99.4 96.2  MCH 30.0 30.3  MCHC 30.2 31.5  RDW 14.3 14.2  PLT 232 209   BNP Recent Labs  Lab 05/16/19 1136 05/19/19 1641  BNP 2,495.6* 1,756.3*    DDimer No results for input(s): DDIMER in the last 168 hours.  Radiology/Studies:  DG Chest Port 1 View  Result Date: 05/19/2019 CLINICAL DATA:  Shortness of breath. EXAM: PORTABLE CHEST 1 VIEW COMPARISON:  May 16, 2019 FINDINGS: Mild hazy infiltrate is again seen along the periphery of the mid to lower right lung and lateral aspect of the left lung base. This is stable in severity. There is no evidence of a pleural effusion or pneumothorax. The cardiac silhouette is markedly enlarged. There is marked severity calcification of the aortic arch. Multilevel degenerative changes seen throughout the thoracic spine. IMPRESSION: Mild stable bilateral infiltrates, unchanged in severity when compared to the prior study dated May 16, 2019 Electronically Signed   By: Aram Candela M.D.   On: 05/19/2019 17:57   DG Chest Portable 1 View  Result Date: 05/16/2019 CLINICAL DATA:  Shortness of breath for 1 month. COVID diagnosis 1 month ago. EXAM:  PORTABLE CHEST 1 VIEW COMPARISON:  Chest x-ray dated 05/09/2019 FINDINGS: Chronic cardiomegaly. New slight distention of the azygos  vein suggesting elevated right heart pressure. The pulmonary vascularity appears normal. There is further accentuation of the interstitial markings in the left midzone peripherally. Faint infiltrate in the right midzone has slightly progressed adjacent to the minor fissure. No effusions. No acute bone abnormality. Aortic atherosclerosis. IMPRESSION: 1. Slight progression of faint infiltrate in the right midzone. 2. New slight distention of the azygos vein. 3. Slight progression of interstitial disease in the left midzone. 4.  Aortic Atherosclerosis (ICD10-I70.0).a the Electronically Signed   By: Francene Boyers M.D.   On: 05/16/2019 12:32    Assessment and Plan:   Mr. Demeyer is presenting with signs and symptoms of acute decompensated heart failure, which is a new diagnosis for him. He would benefit significantly from admission for diuresis (expect at least 30 pounds up) and evaluation for underlying ischemic disease (particularly given CAS and PAD) as well as valvular disease. He was strongly recommended to stay but declined.   Acute decompensated heart failure - Continue oral Lasix, recommend increasing dose to 40 mg BID - Outpatient cardiology referral - Echo to be arranged as outpatient - Should have ischemic evaluation via [facility] after establishing cardiology care - Will determine need for GDMT (BB, RAAS inhibitor, MRA, SGLT2i) based on LVEF on echo   For questions or updates, please contact [facility] Please consult [email or web address] for contact info under   Signed, Cynda Ac[place], MD  05/20/2019 4:19 AM

## 2019-05-27 ENCOUNTER — Emergency Department (HOSPITAL_COMMUNITY)
Admission: EM | Admit: 2019-05-27 | Discharge: 2019-05-27 | Disposition: A | Discharge status: Home / Self Care | Payer: Medicare Other | Attending: Emergency Medicine | Admitting: Emergency Medicine

## 2019-05-27 ENCOUNTER — Other Ambulatory Visit: Payer: Self-pay

## 2019-05-27 ENCOUNTER — Emergency Department (HOSPITAL_COMMUNITY): Payer: Medicare Other

## 2019-05-27 ENCOUNTER — Encounter (HOSPITAL_COMMUNITY): Payer: Self-pay | Admitting: Emergency Medicine

## 2019-05-27 DIAGNOSIS — Z20822 Contact with and (suspected) exposure to covid-19: Secondary | ICD-10-CM | POA: Diagnosis not present

## 2019-05-27 DIAGNOSIS — E876 Hypokalemia: Secondary | ICD-10-CM | POA: Diagnosis not present

## 2019-05-27 DIAGNOSIS — I110 Hypertensive heart disease with heart failure: Secondary | ICD-10-CM | POA: Diagnosis not present

## 2019-05-27 DIAGNOSIS — I5043 Acute on chronic combined systolic (congestive) and diastolic (congestive) heart failure: Secondary | ICD-10-CM | POA: Diagnosis not present

## 2019-05-27 DIAGNOSIS — Z9114 Patient's other noncompliance with medication regimen: Secondary | ICD-10-CM

## 2019-05-27 DIAGNOSIS — Z91148 Patient's other noncompliance with medication regimen for other reason: Secondary | ICD-10-CM

## 2019-05-27 DIAGNOSIS — Z9119 Patient's noncompliance with other medical treatment and regimen: Secondary | ICD-10-CM | POA: Insufficient documentation

## 2019-05-27 DIAGNOSIS — R0602 Shortness of breath: Secondary | ICD-10-CM | POA: Diagnosis present

## 2019-05-27 LAB — CBC
HCT: 34.6 % — ABNORMAL LOW (ref 39.0–52.0)
Hemoglobin: 10.7 g/dL — ABNORMAL LOW (ref 13.0–17.0)
MCH: 30 pg (ref 26.0–34.0)
MCHC: 30.9 g/dL (ref 30.0–36.0)
MCV: 96.9 fL (ref 80.0–100.0)
Platelets: 272 10*3/uL (ref 150–400)
RBC: 3.57 MIL/uL — ABNORMAL LOW (ref 4.22–5.81)
RDW: 14.6 % (ref 11.5–15.5)
WBC: 9.7 10*3/uL (ref 4.0–10.5)
nRBC: 0 % (ref 0.0–0.2)

## 2019-05-27 LAB — BASIC METABOLIC PANEL WITH GFR
Anion gap: 13 (ref 5–15)
BUN: 43 mg/dL — ABNORMAL HIGH (ref 8–23)
CO2: 23 mmol/L (ref 22–32)
Calcium: 8.7 mg/dL — ABNORMAL LOW (ref 8.9–10.3)
Chloride: 102 mmol/L (ref 98–111)
Creatinine, Ser: 1.39 mg/dL — ABNORMAL HIGH (ref 0.61–1.24)
GFR calc Af Amer: 56 mL/min — ABNORMAL LOW
GFR calc non Af Amer: 48 mL/min — ABNORMAL LOW
Glucose, Bld: 130 mg/dL — ABNORMAL HIGH (ref 70–99)
Potassium: 3.2 mmol/L — ABNORMAL LOW (ref 3.5–5.1)
Sodium: 138 mmol/L (ref 135–145)

## 2019-05-27 LAB — RESPIRATORY PANEL BY RT PCR (FLU A&B, COVID)
Influenza A by PCR: NEGATIVE
Influenza B by PCR: NEGATIVE
SARS Coronavirus 2 by RT PCR: NEGATIVE

## 2019-05-27 LAB — BRAIN NATRIURETIC PEPTIDE: B Natriuretic Peptide: 2940 pg/mL — ABNORMAL HIGH (ref 0.0–100.0)

## 2019-05-27 MED ORDER — ASPIRIN 81 MG PO CHEW: 81.0000 mg | CHEWABLE_TABLET | Freq: Every day | ORAL | 0 refills | Status: DC

## 2019-05-27 MED ORDER — FUROSEMIDE 10 MG/ML IJ SOLN
60.0000 mg | Freq: Once | INTRAMUSCULAR | Status: AC
  Administered 2019-05-27: 60 mg via INTRAVENOUS
  Filled 2019-05-27: qty 6

## 2019-05-27 MED ORDER — POTASSIUM CHLORIDE CRYS ER 20 MEQ PO TBCR
40.0000 meq | EXTENDED_RELEASE_TABLET | Freq: Once | ORAL | Status: AC
  Administered 2019-05-27: 40 meq via ORAL
  Filled 2019-05-27: qty 2

## 2019-05-27 MED ORDER — SODIUM CHLORIDE 0.9% FLUSH
3.0000 mL | Freq: Once | INTRAVENOUS | Status: AC
  Administered 2019-05-27: 3 mL via INTRAVENOUS

## 2019-05-27 MED ORDER — AMLODIPINE BESYLATE 5 MG PO TABS: 5.0000 mg | ORAL_TABLET | Freq: Every day | ORAL | 0 refills | Status: DC

## 2019-05-27 MED ORDER — ATORVASTATIN CALCIUM 20 MG PO TABS: 20.0000 mg | ORAL_TABLET | Freq: Every day | ORAL | 0 refills | Status: DC

## 2019-05-27 MED ORDER — FUROSEMIDE 20 MG PO TABS: 20.0000 mg | ORAL_TABLET | Freq: Every day | ORAL | 0 refills | Status: DC

## 2019-05-27 MED ORDER — POTASSIUM CHLORIDE CRYS ER 20 MEQ PO TBCR: 20.0000 meq | EXTENDED_RELEASE_TABLET | Freq: Every day | ORAL | 0 refills | Status: DC

## 2019-05-27 MED ORDER — RAMIPRIL 5 MG PO CAPS: 5.0000 mg | ORAL_CAPSULE | Freq: Every day | ORAL | 0 refills | Status: DC

## 2019-05-27 MED ORDER — ATORVASTATIN CALCIUM 20 MG PO TABS: 10.0000 mg | ORAL_TABLET | Freq: Every day | ORAL | 0 refills | Status: DC

## 2019-05-27 NOTE — ED Notes (Signed)
Patient verbalizes understanding of discharge instructions. Opportunity for questioning and answers were provided. Armband removed by staff, pt discharged from ED.  

## 2019-05-27 NOTE — ED Triage Notes (Signed)
Pt c/o SOB x 2 months and productive cough with brown sputum x 1 month.  Pt reports COVID + 1 month ago.

## 2019-05-27 NOTE — ED Provider Notes (Signed)
[facility]ERG[facility] DEPARTMENT Provider Note   CSN: [id number] Arrival date & time: 05/27/19  1236     History Chief Complaint  Patient presents with  . Shortness of Breath    Kevin Reilly is a 79 y.o. male.  Patient with hx chf, c/o increased sob in the past month, and especially in the past week. Symptoms gradual onset, moderate, persistent, slowly worse, symptoms worse w lying flat and exertion/activity. +orthopnea. No pnd. No chest pain or discomfort. +increased bilateral leg edema. +non prod cough. No known covid + exposure. States has been out of meds for past month.   The history is provided by the patient.  Shortness of Breath Associated symptoms: cough   Associated symptoms: no abdominal pain, no chest pain, no fever, no headaches, no neck pain, no rash, no sore throat and no vomiting        Past Medical History:  Diagnosis Date  . Bilateral carotid artery disease ([place])   . CHF (congestive heart failure) ([place])   . Critical lower limb ischemia   . Hyperlipidemia    takes Atorvastatin daily  . Hypertension    takes Amlodipine and Ramipril daily  . Joint swelling   . Nocturia   . Peripheral arterial disease [facility])     Patient Active Problem List   Diagnosis Date Noted  . Acute decompensated heart failure ([place]) 05/19/2019  . PAD (peripheral artery disease) ([place]) 02/25/2015  . Bilateral carotid artery disease ([place]) 02/05/2015  . Essential hypertension 01/01/2014  . Critical lower limb ischemia 01/01/2014  . Bilateral lower extremity edema 01/01/2014    Past Surgical History:  Procedure Laterality Date  . BACK SURGERY    . colonosocpy    . ENDARTERECTOMY FEMORAL Right 02/25/2015   Procedure: ENDARTERECTOMYRight  FEMORAL Endarterectomy with profundaplasty.;  Surgeon: Elam Dutch, MD;  Location: [facility];  Service: Vascular;  Laterality: Right;  . FEMORAL-POPLITEAL BYPASS GRAFT Right 02/25/2015   Procedure: BYPASS GRAFT FEMORAL-POPLITEAL  ARTERY using non-reversed saphenous vein.;  Surgeon: Elam Dutch, MD;  Location: [place];  Service: Vascular;  Laterality: Right;  . PERIPHERAL VASCULAR CATHETERIZATION N/A 01/24/2015   Procedure: Abdominal Aortogram;  Surgeon: Elam Dutch, MD;  Location: [facility];  Service: Cardiovascular;  Laterality: N/A;       No family history on file.  Social History   Tobacco Use  . Smoking status: Former Smoker    Types: Cigarettes  . Smokeless tobacco: Never Used  Substance Use Topics  . Alcohol use: No    Alcohol/week: 0.0 standard drinks  . Drug use: No    Home Medications Prior to Admission medications   Medication Sig Start Date End Date Taking? Authorizing Provider  acetaminophen (TYLENOL) 500 MG tablet Take 1,500 mg by mouth 2 (two) times daily as needed for headache.     [provider]  amLODipine (NORVASC) 5 MG tablet Take 1 tablet (5 mg total) by mouth daily. Patient not taking: Reported on 05/16/2019 02/05/15   Lorretta Harp, MD  amoxicillin-clavulanate (AUGMENTIN) 875-125 MG tablet Take 1 tablet by mouth 2 (two) times daily. One po bid x 7 days Patient taking differently: Take 1 tablet by mouth 2 (two) times daily.  05/16/19   Sherwood Gambler, MD  atorvastatin (LIPITOR) 20 MG tablet Take 1 tablet (20 mg total) by mouth daily. Patient not taking: Reported on 05/16/2019 02/13/15   Lorretta Harp, MD  azithromycin (ZITHROMAX) 250 MG tablet Take 1 tablet (250  mg total) by mouth daily. Take first 2 tablets together, then 1 every day until finished. Patient taking differently: Take 250-500 mg by mouth See admin instructions. Take 2 tablets (500 mg) by mouth 1st day, then take 1 tablet (250 mg) daily on days 2-5 05/16/19   Pricilla Loveless, MD  furosemide (LASIX) 20 MG tablet Take 1 tablet (20 mg total) by mouth daily. 05/16/19   Pricilla Loveless, MD  hydrochlorothiazide (MICROZIDE) 12.5 MG capsule Take 1 capsule (12.5 mg total) by mouth daily. NEED OV. Patient  not taking: Reported on 05/16/2019 03/08/16   Runell Gess, MD  ramipril (ALTACE) 10 MG capsule Take 1 capsule (10 mg total) by mouth at bedtime. Patient not taking: Reported on 05/16/2019 02/28/15   Sherren Kerns, MD    Allergies    Chocolate and Sulfur  Review of Systems   Review of Systems  Constitutional: Negative for chills and fever.  HENT: Negative for sore throat.   Eyes: Negative for redness.  Respiratory: Positive for cough and shortness of breath.   Cardiovascular: Positive for leg swelling. Negative for chest pain.  Gastrointestinal: Negative for abdominal pain and vomiting.  Endocrine: Negative for polyuria.  Genitourinary: Negative for dysuria and flank pain.  Musculoskeletal: Negative for back pain and neck pain.  Skin: Negative for rash.  Neurological: Negative for headaches.  Hematological: Does not bruise/bleed easily.  Psychiatric/Behavioral: Negative for confusion.    Physical Exam Updated Vital Signs BP (!) 157/80 (BP Location: Left Arm)   Pulse (!) 103   Temp 98.4 F (36.9 C) (Oral)   Resp 16   SpO2 95%   Physical Exam Vitals and nursing note reviewed.  Constitutional:      Appearance: Normal appearance. He is well-developed.  HENT:     Head: Atraumatic.     Nose: Nose normal.     Mouth/Throat:     Mouth: Mucous membranes are moist.     Pharynx: Oropharynx is clear.  Eyes:     General: No scleral icterus.    Conjunctiva/sclera: Conjunctivae normal.     Pupils: Pupils are equal, round, and reactive to light.  Neck:     Trachea: No tracheal deviation.  Cardiovascular:     Rate and Rhythm: Normal rate and regular rhythm.     Pulses: Normal pulses.     Heart sounds: Normal heart sounds. No murmur. No friction rub. No gallop.   Pulmonary:     Effort: Pulmonary effort is normal. No accessory muscle usage or respiratory distress.     Breath sounds: Rales present.     Comments: Basilar rales.  Abdominal:     General: Bowel sounds are  normal. There is no distension.     Palpations: Abdomen is soft.     Tenderness: There is no abdominal tenderness. There is no guarding.  Genitourinary:    Comments: No cva tenderness. Musculoskeletal:        General: Swelling present.     Cervical back: Normal range of motion and neck supple. No rigidity.     Right lower leg: Edema present.     Left lower leg: Edema present.     Comments: Moderate/severe, symmetric, bilateral leg edema to mid thighs.   Skin:    General: Skin is warm and dry.     Findings: No rash.  Neurological:     Mental Status: He is alert.     Comments: Alert, speech clear.   Psychiatric:        Mood  and Affect: Mood normal.     ED Results / Procedures / Treatments   Labs (all labs ordered are listed, but only abnormal results are displayed) Results for orders placed or performed during the hospital encounter of 05/27/19  Respiratory Panel by RT PCR (Flu A&B, Covid) - Nasopharyngeal Swab   Specimen: Nasopharyngeal Swab  Result Value Ref Range   SARS Coronavirus 2 by RT PCR NEGATIVE NEGATIVE   Influenza A by PCR NEGATIVE NEGATIVE   Influenza B by PCR NEGATIVE NEGATIVE  Basic metabolic panel  Result Value Ref Range   Sodium 138 135 - 145 mmol/L   Potassium 3.2 (L) 3.5 - 5.1 mmol/L   Chloride 102 98 - 111 mmol/L   CO2 23 22 - 32 mmol/L   Glucose, Bld 130 (H) 70 - 99 mg/dL   BUN 43 (H) 8 - 23 mg/dL   Creatinine, Ser 1.51 (H) 0.61 - 1.24 mg/dL   Calcium 8.7 (L) 8.9 - 10.3 mg/dL   GFR calc non Af Amer 48 (L) >60 mL/min   GFR calc Af Amer 56 (L) >60 mL/min   Anion gap 13 5 - 15  CBC  Result Value Ref Range   WBC 9.7 4.0 - 10.5 K/uL   RBC 3.57 (L) 4.22 - 5.81 MIL/uL   Hemoglobin 10.7 (L) 13.0 - 17.0 g/dL   HCT 76.1 (L) 60.7 - 37.1 %   MCV 96.9 80.0 - 100.0 fL   MCH 30.0 26.0 - 34.0 pg   MCHC 30.9 30.0 - 36.0 g/dL   RDW 06.2 69.4 - 85.4 %   Platelets 272 150 - 400 K/uL   nRBC 0.0 0.0 - 0.2 %  Brain natriuretic peptide  Result Value Ref Range   B  Natriuretic Peptide 2,940.0 (H) 0.0 - 100.0 pg/mL   DG Chest 2 View  Result Date: 05/27/2019 CLINICAL DATA:  Pt c/o SOB x 2 months and productive cough with brown sputum x 1 month. Pt reports COVID + 1 month ago EXAM: CHEST - 2 VIEW COMPARISON:  Chest radiograph 05/19/2019 FINDINGS: Stable cardiomediastinal contours with mildly enlarged heart size. Aortic arch calcification. Persistent diffuse bilateral coarse interstitial opacities with peripheral and basilar predominance, stable on the right and may be slightly increased on the left. No pneumothorax or large pleural effusion. No acute finding in the visualized skeleton. IMPRESSION: Persistent diffuse bilateral interstitial opacities with peripheral and basilar predominance, appear stable on the right and may be slightly increased on the left. Electronically Signed   By: Emmaline Kluver M.D.   On: 05/27/2019 13:49   DG Chest 2 View  Result Date: 05/09/2019 CLINICAL DATA:  Shortness of breath for the past few weeks. EXAM: CHEST - 2 VIEW COMPARISON:  None. FINDINGS: Mild cardiomegaly. Diffuse interstitial thickening. Focal mild patchy opacity in the peripheral right upper lobe. No pleural effusion or pneumothorax. No acute osseous abnormality. IMPRESSION: Mild cardiomegaly with diffuse interstitial pulmonary edema. Focal patchy opacity in the right upper lobe may reflect superimposed pneumonia. Electronically Signed   By: Obie Dredge M.D.   On: 05/09/2019 15:29   DG Chest Port 1 View  Result Date: 05/19/2019 CLINICAL DATA:  Shortness of breath. EXAM: PORTABLE CHEST 1 VIEW COMPARISON:  May 16, 2019 FINDINGS: Mild hazy infiltrate is again seen along the periphery of the mid to lower right lung and lateral aspect of the left lung base. This is stable in severity. There is no evidence of a pleural effusion or pneumothorax. The cardiac silhouette is markedly enlarged.  There is marked severity calcification of the aortic arch. Multilevel degenerative  changes seen throughout the thoracic spine. IMPRESSION: Mild stable bilateral infiltrates, unchanged in severity when compared to the prior study dated May 16, 2019 Electronically Signed   By: Aram Candela M.D.   On: 05/19/2019 17:57   DG Chest Portable 1 View  Result Date: 05/16/2019 CLINICAL DATA:  Shortness of breath for 1 month. COVID diagnosis 1 month ago. EXAM: PORTABLE CHEST 1 VIEW COMPARISON:  Chest x-ray dated 05/09/2019 FINDINGS: Chronic cardiomegaly. New slight distention of the azygos vein suggesting elevated right heart pressure. The pulmonary vascularity appears normal. There is further accentuation of the interstitial markings in the left midzone peripherally. Faint infiltrate in the right midzone has slightly progressed adjacent to the minor fissure. No effusions. No acute bone abnormality. Aortic atherosclerosis. IMPRESSION: 1. Slight progression of faint infiltrate in the right midzone. 2. New slight distention of the azygos vein. 3. Slight progression of interstitial disease in the left midzone. 4.  Aortic Atherosclerosis (ICD10-I70.0).a the Electronically Signed   By: Francene Boyers M.D.   On: 05/16/2019 12:32    EKG EKG Interpretation  Date/Time:  Sunday May 27 2019 13:43:52 EST Ventricular Rate:  96 PR Interval:    QRS Duration: 105 QT Interval:  341 QTC Calculation: 431 R Axis:   71 Text Interpretation: Sinus rhythm Left atrial enlargement LVH with secondary repolarization abnormality Non-specific ST-t changes No significant change since last tracing Confirmed by Cathren Laine ([id number]) on 05/27/2019 1:44:37 PM   Radiology DG Chest 2 View  Result Date: 05/27/2019 CLINICAL DATA:  Pt c/o SOB x 2 months and productive cough with brown sputum x 1 month. Pt reports COVID + 1 month ago EXAM: CHEST - 2 VIEW COMPARISON:  Chest radiograph 05/19/2019 FINDINGS: Stable cardiomediastinal contours with mildly enlarged heart size. Aortic arch calcification. Persistent diffuse  bilateral coarse interstitial opacities with peripheral and basilar predominance, stable on the right and may be slightly increased on the left. No pneumothorax or large pleural effusion. No acute finding in the visualized skeleton. IMPRESSION: Persistent diffuse bilateral interstitial opacities with peripheral and basilar predominance, appear stable on the right and may be slightly increased on the left. Electronically Signed   By: Emmaline Kluver M.D.   On: 05/27/2019 13:49    Procedures Procedures (including critical care time)  Medications Ordered in ED Medications  sodium chloride flush (NS) 0.9 % injection 3 mL (has no administration in time range)    ED Course  I have reviewed the triage vital signs and the nursing notes.  Pertinent labs & imaging results that were available during my care of the patient were reviewed by me and considered in my medical decision making (see chart for details).    MDM Rules/Calculators/A&P                      Iv ns. Continuous pulse ox and monitor. Stat labs and imaging. Ecg.   Reviewed nursing notes and prior charts for additional history.   Labs reviewed/interpreted by me - chem w k mildly low. kcl po.   CXR reviewed/interpreted by me - bil opacities/failure.  Additional labs reviewed/interpreted by me - bnp markedly elevated, and increased from prior.   Given acute on chronic chf, increased sob and work of breathing, increased edema, will consult medicine for admission.  Discussed admission with patient, and reasons for admission given recent recurrent ED visits. Patient refuses admission, and requests d/c to home.  Patient indicates has not filled his prescriptions/noncompliant. TOC team consulted to assist w ensuring back on home meds, able to access and taking appropriately. TOC team indicates pt has insurance for meds, and that they will arrange home health/home health RN for tomorrow to check on pt, assist w med adherence, etc.    Re-offered admission - pt again request d/c to home, does not want to stay in hospital.  Additional labs reviewed/interpreted by me - covid neg. Pt informed.   Recheck pt, breathing comfortably. D/c to home per pt request.   Rec close outpt pcp/card f/u.  Return precautions provided.        Final Clinical Impression(s) / ED Diagnoses Final diagnoses:  None    Rx / DC Orders ED Discharge Orders    None       Cathren Laine, MD 05/27/19 1545

## 2019-05-27 NOTE — ED Notes (Signed)
Assisted pt with getting his phone and the room  phone

## 2019-05-27 NOTE — Discharge Planning (Addendum)
Kevin Reilly J. Kevin Roers, RN, BSN, [place] [phone] [facility] consulted regarding home health services. EDCM arranged HH services with [facility].  [facility] notified of pt noncompliance and will have RN at pt home tomorrow to access home environment and Rx schedule.  No DME needs identified at this time.

## 2019-05-27 NOTE — Discharge Instructions (Addendum)
It was our pleasure to provide your ER care today - we hope that you feel better.  Take your medications as prescribed. Eat heart health diet, and limit salt intake. Elevate legs.  From today's labs, your potassium level is also low - eat plenty or fruits/vegetables, and take potassium supplement as prescribed.   Follow up with primary care doctor/cardiologist in the next 2-3 days.   We have also made home health referral - they should be contacting you tomorrow.   Return to ER if worse, new symptoms, fevers, chest pain, increased trouble breathing, or other concern.

## 2019-05-30 ENCOUNTER — Encounter (HOSPITAL_COMMUNITY): Payer: Self-pay | Admitting: Cardiology

## 2019-05-30 ENCOUNTER — Encounter: Payer: Self-pay | Admitting: Cardiology

## 2019-05-30 ENCOUNTER — Inpatient Hospital Stay (HOSPITAL_COMMUNITY): Admission: RE | Admit: 2019-05-30 | Payer: Self-pay | Source: Ambulatory Visit

## 2019-05-30 ENCOUNTER — Ambulatory Visit: Payer: Medicare Other | Admitting: Cardiology

## 2019-05-30 ENCOUNTER — Other Ambulatory Visit: Payer: Self-pay

## 2019-05-30 VITALS — BP 160/84 | HR 96 | Ht 70.0 in | Wt 170.0 lb

## 2019-05-30 DIAGNOSIS — R0609 Other forms of dyspnea: Secondary | ICD-10-CM

## 2019-05-30 DIAGNOSIS — Z9119 Patient's noncompliance with other medical treatment and regimen: Secondary | ICD-10-CM

## 2019-05-30 DIAGNOSIS — R0600 Dyspnea, unspecified: Secondary | ICD-10-CM

## 2019-05-30 DIAGNOSIS — I509 Heart failure, unspecified: Secondary | ICD-10-CM

## 2019-05-30 DIAGNOSIS — I739 Peripheral vascular disease, unspecified: Secondary | ICD-10-CM

## 2019-05-30 DIAGNOSIS — I6523 Occlusion and stenosis of bilateral carotid arteries: Secondary | ICD-10-CM

## 2019-05-30 DIAGNOSIS — I10 Essential (primary) hypertension: Secondary | ICD-10-CM

## 2019-05-30 DIAGNOSIS — E876 Hypokalemia: Secondary | ICD-10-CM

## 2019-05-30 DIAGNOSIS — Z87891 Personal history of nicotine dependence: Secondary | ICD-10-CM

## 2019-05-30 DIAGNOSIS — Z91199 Patient's noncompliance with other medical treatment and regimen due to unspecified reason: Secondary | ICD-10-CM

## 2019-05-30 DIAGNOSIS — E782 Mixed hyperlipidemia: Secondary | ICD-10-CM

## 2019-05-30 DIAGNOSIS — I4510 Unspecified right bundle-branch block: Secondary | ICD-10-CM

## 2019-05-30 HISTORY — DX: Dyspnea, unspecified: R06.00

## 2019-05-30 HISTORY — DX: Other forms of dyspnea: R06.09

## 2019-05-30 MED ORDER — RAMIPRIL 10 MG PO CAPS: 10.0000 mg | ORAL_CAPSULE | Freq: Every day | ORAL | 0 refills | Status: DC

## 2019-05-30 MED ORDER — FUROSEMIDE 20 MG PO TABS: 20.0000 mg | ORAL_TABLET | Freq: Every day | ORAL | 1 refills | Status: DC

## 2019-05-30 MED ORDER — POTASSIUM CHLORIDE CRYS ER 20 MEQ PO TBCR: 20.0000 meq | EXTENDED_RELEASE_TABLET | Freq: Every day | ORAL | 0 refills | Status: DC

## 2019-05-30 MED ORDER — ATORVASTATIN CALCIUM 20 MG PO TABS: 20.0000 mg | ORAL_TABLET | Freq: Every day | ORAL | 3 refills | Status: DC

## 2019-05-30 MED ORDER — ASPIRIN 81 MG PO CHEW: 81.0000 mg | CHEWABLE_TABLET | Freq: Every day | ORAL | 3 refills | Status: DC

## 2019-05-30 NOTE — Progress Notes (Signed)
Attempted to contact patient 2x to see if he is coming for covid test or if he has been quarenteened since his test on 05/27/19, no answer and no way to leave a voicemail.

## 2019-05-30 NOTE — Progress Notes (Signed)
REASON FOR CONSULT: Acute decompensated heart failure (HCC) and shortness of breath.  Chief Complaint  Patient presents with  . Congestive Heart Failure  . Shortness of Breath  . New Patient (Initial Visit)    REQUESTING PHYSICIAN:  No referring provider defined for this encounter.  HPI  Kevin Reilly is a 79 y.o. male who presents to the office with a chief complaint of "shortness of breath."  Patient's past medical history and cardiac risk factors include: Carotid artery disease, recently diagnosed with congestive heart failure, peripheral artery disease with prior intervention, hyperlipidemia, hypertension, medical noncompliance.  Patient is accompanied by Kevin Reilly, his employee at today's office visit.  Patient provides verbal consent and discussing his medical conditions and plan of care in her presence. Patient was referred to the practice by an ER physician whose name he cannot recall.  Shortness of breath: Patient states that has been experiencing shortness of breath for the last 2 and half months.  However since February 2021 his symptoms have been getting progressively worse.  According to electronic medical records he has had approximately 3 visits  at [facility] and has refused inpatient hospitalization.  Patient states that his shortness of breath is mostly brought on by effort related activities.  He barely can walk 10 steps before he is out of breath.  The symptoms usually resolve after resting.  In his lower extremity swelling which is chronic also has also gotten worsened.  He states for the last several months he has been out of his medications and does not have a primary care physician with whom he follows.  Patient has been tested negative for COVID-19 in the past, does have a productive cough and at times has a brown sputum production.  No fevers or chills. Review of systems positive for: shortness of breath effort related symptoms, lower extremity  swelling. Currently patient denies chest pain, lightheadedness, dizziness, palpitations, orthopnea, paroxysmal nocturnal dyspnea, near syncope, syncopal events, hematochezia, hemoptysis, hematemesis, melanotic stools, no symptoms of amaurosis fugax, motor or sensory symptoms or dysphasia in the last 6 months.   History of  congestive heart failure, peripheral artery disease. Denies prior history of coronary artery disease, myocardial infarction, pulmonary embolism, stroke, transient ischemic attack.  FUNCTIONAL STATUS: Patient states that his functional status has overall declined in the past 2 months since the onset of dyspnea on exertion.  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  ALLERGIES: Allergies  Allergen Reactions  . Chocolate Hives  . Sulfur Rash   MEDICATION LIST PRIOR TO VISIT: Current Outpatient Medications on File Prior to Visit  Medication Sig Dispense Refill  . acetaminophen (TYLENOL) 500 MG tablet Take 1,500 mg by mouth 2 (two) times daily as needed for headache.     Marland Kitchen azithromycin (ZITHROMAX) 250 MG tablet Take 1 tablet (250 mg total) by mouth daily. Take first 2 tablets together, then 1 every day until finished. (Patient taking differently: Take 250-500 mg by mouth See admin instructions. Take 2 tablets (500 mg) by mouth 1st day, then take 1 tablet (250 mg) daily on days 2-5) 6 tablet 0  . amoxicillin-clavulanate (AUGMENTIN) 875-125 MG tablet Take 1 tablet by mouth 2 (two) times daily. One po bid x 7 days (Patient not taking: Reported on 05/30/2019) 14 tablet 0   No current facility-administered medications on file prior to visit.    PAST MEDICAL HISTORY: Past Medical History:  Diagnosis Date  . Bilateral carotid artery disease (  [place])   . CHF (congestive heart failure) ([place])   . Critical lower limb ischemia   . Hyperlipidemia    takes Atorvastatin daily  . Hypertension    takes Amlodipine and Ramipril daily  . Joint swelling   . Nocturia   .  Peripheral arterial disease ([place])     PAST SURGICAL HISTORY: Past Surgical History:  Procedure Laterality Date  . BACK SURGERY    . colonosocpy    . ENDARTERECTOMY FEMORAL Right 02/25/2015   Procedure: ENDARTERECTOMYRight  FEMORAL Endarterectomy with profundaplasty.;  Surgeon: Elam Dutch, MD;  Location: [place];  Service: Vascular;  Laterality: Right;  . FEMORAL-POPLITEAL BYPASS GRAFT Right 02/25/2015   Procedure: BYPASS GRAFT FEMORAL-POPLITEAL ARTERY using non-reversed saphenous vein.;  Surgeon: Elam Dutch, MD;  Location: [place];  Service: Vascular;  Laterality: Right;  . PERIPHERAL VASCULAR CATHETERIZATION N/A 01/24/2015   Procedure: Abdominal Aortogram;  Surgeon: Elam Dutch, MD;  Location: [facility];  Service: Cardiovascular;  Laterality: N/A;    FAMILY HISTORY: The patient family history is not on file.  Denies family history of premature coronary disease or sudden cardiac death.   SOCIAL HISTORY:  The patient  reports that he has quit smoking. His smoking use included cigarettes. He has never used smokeless tobacco. He reports that he does not drink alcohol or use drugs.  14 ORGAN REVIEW OF SYSTEMS: CONSTITUTIONAL: No fever or significant weight loss EYES: No recent significant visual change EARS, NOSE, MOUTH, THROAT: No recent significant change in hearing CARDIOVASCULAR: See discussion in subjective/HPI RESPIRATORY: See discussion in subjective/HPI GASTROINTESTINAL: No recent complaints of abdominal pain GENITOURINARY: No recent significant change in genitourinary status MUSCULOSKELETAL: No recent significant change in musculoskeletal status INTEGUMENTARY: No recent rash NEUROLOGIC: No recent significant change in motor function PSYCHIATRIC: No recent significant change in mood ENDOCRINOLOGIC: No recent significant change in endocrine status HEMATOLOGIC/LYMPHATIC: No recent significant unexpected bruising ALLERGIC/IMMUNOLOGIC: No recent unexplained  allergic reaction  PHYSICAL EXAM: Vitals with BMI 05/30/2019 05/27/2019 05/27/2019  Height 5' 10"  - -  Weight 150 lbs - -  BMI 76.80 - -  Systolic 881 103 159  Diastolic 92 71 65  Pulse 96 98 -  Some encounter information is confidential and restricted. Go to Review Flowsheets activity to see all data.   CONSTITUTIONAL: Elderly appearing male, sitting upright, hemodynamically stable, no acute distress. SKIN: Skin is warm and dry. No rash noted. No cyanosis. No pallor. No jaundice HEAD: Normocephalic and atraumatic.  EYES: No scleral icterus MOUTH/THROAT: Dry oral membranes.  Poor dentition. NECK: JVP present. No thyromegaly noted.  Left-sided carotid bruit appreciated. LYMPHATIC: No visible cervical adenopathy.  CHEST Normal respiratory effort. No intercostal retractions  LUNGS: Decreased breath sounds at the bases.  No stridor. No wheezes.  Positive rales bilateral.  CARDIOVASCULAR: Regular rate and rhythm, positive Y5-O5, soft systolic murmur heard at the left sternal border, no gallops or rubs. ABDOMINAL: Soft, nontender, nondistended, positive bowel sounds, no apparent ascites.  EXTREMITIES: Bilateral lower extremities are warm to touch, 2+ pitting edema up to the knees bilaterally. HEMATOLOGIC: No significant bruising NEUROLOGIC: Oriented to person, place, and time. Nonfocal. Normal muscle tone.  PSYCHIATRIC: Normal mood and affect. Normal behavior. Cooperative  RADIOLOGY: 05/19/2019:Persistent diffuse bilateral interstitial opacities with peripheral and basilar predominance, appear stable on the right and may be slightly increased on the left.  CARDIAC DATABASE: EKG: 05/19/2019 Normal sinus rhythm with a ventricular rate of 91 bpm, left atrial enlargement, normal axis, downsloping ST depressions in  the inferior and lateral leads suggestive of underlying ischemia or secondary to LVH.   05/30/2015: Normal sinus rhythm with a ventricular rate of 90 bpm, left atrial enlargement.  Right  bundle branch block, ST depressions in the high lateral and lateral leads suggestive of possible ischemia in the anterolateral distribution.  Echocardiogram: None  Stress Testing:  None  Heart Catheterization: None  Carotid duplex: 01/2015: Technically diffiuclt due to tortuosity and turbulent flow. - Bilateral - 40% to 59% ICA stenosis upper end of scale. Acoustic shadowing may obscure higher velocities. Turbulent flow noted. ECA stenosis. vertebral artey flow is antegrade.   Vascular imaging: Right Lower Extremity Venous Duplex Evaluation 02/2015: - No evidence of deep vein thrombosis involving the right lower extremity and left common femoral vein. Strong monophasic waveforms at the distal PTA and ATA.   DVT Study 11/07/2018:  Right: There is no evidence of deep vein thrombosis in the lower extremity. Left: No evidence of common femoral vein obstruction.   ABI 04/23/2015: Right ABI 0.81, indicating mild arterial occlusive disease at rest.  Left ABI 0.65, indicating moderate arterial occlusive disease at rest.  LABORATORY DATA: CBC Latest Ref Rng & Units 05/27/2019 05/19/2019 05/16/2019  WBC 4.0 - 10.5 K/uL 9.7 7.1 6.2  Hemoglobin 13.0 - 17.0 g/dL 10.7(L) 11.3(L) 10.5(L)  Hematocrit 39.0 - 52.0 % 34.6(L) 35.9(L) 34.8(L)  Platelets 150 - 400 K/uL 272 209 232    CMP Latest Ref Rng & Units 05/27/2019 05/19/2019 05/16/2019  Glucose 70 - 99 mg/dL 130(H) 129(H) 143(H)  BUN 8 - 23 mg/dL 43(H) 46(H) 43(H)  Creatinine 0.61 - 1.24 mg/dL 1.39(H) 1.80(H) 1.82(H)  Sodium 135 - 145 mmol/L 138 140 140  Potassium 3.5 - 5.1 mmol/L 3.2(L) 3.4(L) 3.7  Chloride 98 - 111 mmol/L 102 106 109  CO2 22 - 32 mmol/L 23 20(L) 20(L)  Calcium 8.9 - 10.3 mg/dL 8.7(L) 9.0 8.8(L)  Total Protein 6.5 - 8.1 g/dL - - -  Total Bilirubin 0.3 - 1.2 mg/dL - - -  Alkaline Phos 38 - 126 U/L - - -  AST 15 - 41 U/L - - -  ALT 17 - 63 U/L - - -    Lipid Panel     Component Value Date/Time   CHOL 174 02/10/2015 0855   TRIG  137 02/10/2015 0855   HDL 42 02/10/2015 0855   CHOLHDL 4.1 02/10/2015 0855   VLDL 27 02/10/2015 0855   LDLCALC 105 02/10/2015 0855   BNP (last 3 results) Recent Labs    05/16/19 1136 05/19/19 1641 05/27/19 1302  BNP 2,495.6* 1,756.3* 2,940.0*   FINAL MEDICATION LIST END OF ENCOUNTER: Meds ordered this encounter  Medications  . aspirin 81 MG chewable tablet    Sig: Chew 1 tablet (81 mg total) by mouth daily.    Dispense:  90 tablet    Refill:  3  . atorvastatin (LIPITOR) 20 MG tablet    Sig: Take 1 tablet (20 mg total) by mouth daily.    Dispense:  90 tablet    Refill:  3  . furosemide (LASIX) 20 MG tablet    Sig: Take 1 tablet (20 mg total) by mouth daily.    Dispense:  90 tablet    Refill:  1  . ramipril (ALTACE) 10 MG capsule    Sig: Take 1 capsule (10 mg total) by mouth at bedtime.    Dispense:  30 capsule    Refill:  0    Future refills need to be authorized  by pt's PCP  . potassium chloride SA (KLOR-CON) 20 MEQ tablet    Sig: Take 1 tablet (20 mEq total) by mouth daily. Patient may request a liquid formulation.    Dispense:  30 tablet    Refill:  0    Medications Discontinued During This Encounter  Medication Reason  . furosemide (LASIX) 20 MG tablet Error  . amLODipine (NORVASC) 5 MG tablet Discontinued by provider  . amLODipine (NORVASC) 5 MG tablet Discontinued by provider  . atorvastatin (LIPITOR) 20 MG tablet Duplicate  . hydrochlorothiazide (MICROZIDE) 12.5 MG capsule Discontinued by provider  . ramipril (ALTACE) 5 MG capsule Duplicate  . atorvastatin (LIPITOR) 20 MG tablet Reorder  . ramipril (ALTACE) 10 MG capsule Reorder  . furosemide (LASIX) 20 MG tablet Reorder  . potassium chloride SA (KLOR-CON) 20 MEQ tablet Reorder  . aspirin 81 MG chewable tablet Reorder     Current Outpatient Medications:  .  acetaminophen (TYLENOL) 500 MG tablet, Take 1,500 mg by mouth 2 (two) times daily as needed for headache. , Disp: , Rfl:  .  azithromycin (ZITHROMAX)  250 MG tablet, Take 1 tablet (250 mg total) by mouth daily. Take first 2 tablets together, then 1 every day until finished. (Patient taking differently: Take 250-500 mg by mouth See admin instructions. Take 2 tablets (500 mg) by mouth 1st day, then take 1 tablet (250 mg) daily on days 2-5), Disp: 6 tablet, Rfl: 0 .  amoxicillin-clavulanate (AUGMENTIN) 875-125 MG tablet, Take 1 tablet by mouth 2 (two) times daily. One po bid x 7 days (Patient not taking: Reported on 05/30/2019), Disp: 14 tablet, Rfl: 0 .  aspirin 81 MG chewable tablet, Chew 1 tablet (81 mg total) by mouth daily., Disp: 90 tablet, Rfl: 3 .  atorvastatin (LIPITOR) 20 MG tablet, Take 1 tablet (20 mg total) by mouth daily., Disp: 90 tablet, Rfl: 3 .  furosemide (LASIX) 20 MG tablet, Take 1 tablet (20 mg total) by mouth daily., Disp: 90 tablet, Rfl: 1 .  potassium chloride SA (KLOR-CON) 20 MEQ tablet, Take 1 tablet (20 mEq total) by mouth daily. Patient may request a liquid formulation., Disp: 30 tablet, Rfl: 0 .  ramipril (ALTACE) 10 MG capsule, Take 1 capsule (10 mg total) by mouth at bedtime., Disp: 30 capsule, Rfl: 0  IMPRESSION:    ICD-10-CM   1. Dyspnea on exertion  R06.00 furosemide (LASIX) 20 MG tablet    PCV ECHOCARDIOGRAM COMPLETE  2. Acute decompensated heart failure (HCC)  I50.9 Novel Coronavirus, NAA ([facility])    aspirin 81 MG chewable tablet    atorvastatin (LIPITOR) 20 MG tablet    furosemide (LASIX) 20 MG tablet    ramipril (ALTACE) 10 MG capsule    CMP14+EGFR    Pro b natriuretic peptide (BNP)    Magnesium    PCV ECHOCARDIOGRAM COMPLETE  3. Essential hypertension  I10 ramipril (ALTACE) 10 MG capsule  4. PAD (peripheral artery disease) (HCC)  I73.9 aspirin 81 MG chewable tablet  5. Mixed hyperlipidemia  E78.2 atorvastatin (LIPITOR) 20 MG tablet  6. Hypokalemia  E87.6 potassium chloride SA (KLOR-CON) 20 MEQ tablet  7. RBBB  I45.10   8. History of noncompliance with medical treatment  Z91.19       RECOMMENDATIONS: Acute decompensated heart failure, NYHA class III. -Plan echo to evaluate LV function and structural heart disease. -Educated on the importance of medication compliance.  Patient is currently not on any medications and has not filled any prescription that were provided by the  ER physician. -We will start Lasix 1 mg p.o. every morning. -We will start ramipril 10 mg p.o. every evening. -Discontinue amlodipine given the degree of lower extremity swelling. -Klor-Con 20 mEq p.o. daily -Not started on beta-blocker therapy due to acute decompensated heart failure. -Not started on aldosterone antagonist as the patient's compliance is unknown and his therapy requires periodic follow-up for kidney function and electrolytes. -We will consider transitioning ramipril to Entresto depending on echo results and patient's compliance. -We will consider the addition of hydralazine/Isordil combination at the next office visit.  Currently focusing on diuresis. -Check CMP, magnesium level, and BNP in 1 week given the changes in medication and symptoms. -Recommend weight check and strict I's and O's. -Fluid restriction to less than 2 L/day, and sodium restrictions to less than 2 g/day. -EKG performed and findings noted above.  Prior EKG findings reviewed/interpreted and noted above. -Given the EKG findings, patient's risk factors, clinical presentation, recommend undergoing left and right heart catheterizations to evaluate coronary anatomy with possible intervention and to evaluate intracardiac pressures/hemodynamics respectively. -Had a detailed discussion with the patient and recommended transport to ER given the patient's presentation.  Patient refuses.  Patient verbalizes understanding that delaying cardiovascular care may predispose him to worsening morbidity and mortality.  Patient verbalized understanding that if he develops typical chest pain as discussed or has worsening symptoms as far as  increasing intensity, frequency, or duration he will seek medical attention at the closest ER via EMS. -Patient is agreeable for elective left and right heart catheterization. -Screen for COVID-19 infection and patient is educated on self quarantine. -The left and the right heart catheterization procedure was explained to the patient in details. The indication, alternatives, risks and benefits were reviewed. Complications including but not limited to bleeding, infection, acute kidney injury, blood transfusion, heart rhythm disturbances, contrast (dye) reaction, damage to the arteries or nerves in the legs or hands, cerebrovascular accident, myocardial infarction, need for emergent bypass surgery, blood clots in the legs, possible need for emergent blood transfusion, and rarely death were reviewed and discussed with the patient. The patient voices understanding and wishes to proceed.   Dyspnea on exertion: See above  Benign essential hypertension: See above, medical therapy currently being titrated.  Peripheral artery disease with prior intervention: Started on aspirin 81 mg p.o. daily and statin therapy 20 mg p.o. nightly.  Will uptitrate as the patient's compliance improves.  Hypokalemia: Prescribe Klor-Con 20 mEq p.o. daily  Educated on the importance of medication compliance.  Orders Placed This Encounter  Procedures  . Novel Coronavirus, NAA ([facility])  . CMP14+EGFR  . Pro b natriuretic peptide (BNP)  . Magnesium  . EKG 12-Lead  . PCV ECHOCARDIOGRAM COMPLETE   --Continue cardiac medications as reconciled in final medication list. --Return in about 1 week (around 06/06/2019).. Or sooner if needed. --Continue follow-up with your primary care physician regarding the management of your other chronic comorbid conditions.  Patient's questions and concerns were addressed to his satisfaction. He voices understanding of the instructions provided during this encounter.   This note was created  using a voice recognition software as a result there may be grammatical errors inadvertently enclosed that do not reflect the nature of this encounter. Every attempt is made to correct such errors.  Rex Kras, DO, [facility]. [place] Office: [phone]

## 2019-05-30 NOTE — Patient Instructions (Addendum)
Please remember to bring in your medication bottles in at the next visit.   New Medications that were added at today's visit:  Ramipril  Lasix Potassium supplement Lipitor  Aspirin   Heart cath will be scheduled for 05/31/2019. Please go to the nearest ER if he has worsening shortness of breath or has chest pain.   COVID screening to be done today at [facility].  Please get labs done on 02/22/2021at the nearest [facility].  Follow up in one week.

## 2019-05-31 ENCOUNTER — Ambulatory Visit (HOSPITAL_COMMUNITY): Admission: RE | Admit: 2019-05-31 | Payer: Medicare Other | Source: Home / Self Care | Admitting: Cardiology

## 2019-05-31 ENCOUNTER — Encounter (HOSPITAL_COMMUNITY): Admission: RE | Payer: Self-pay | Source: Home / Self Care

## 2019-05-31 SURGERY — RIGHT/LEFT HEART CATH AND CORONARY ANGIOGRAPHY
Anesthesia: LOCAL

## 2019-06-01 ENCOUNTER — Telehealth: Payer: Self-pay

## 2019-06-01 NOTE — Telephone Encounter (Signed)
Please try to call him periodically to get in touch as he is sick and needs to be followed up on.   Thanks for your help.

## 2019-06-06 ENCOUNTER — Ambulatory Visit: Payer: Medicare Other | Admitting: Cardiology

## 2019-06-06 ENCOUNTER — Encounter: Payer: Self-pay | Admitting: Cardiology

## 2019-06-06 ENCOUNTER — Other Ambulatory Visit: Payer: Self-pay

## 2019-06-06 ENCOUNTER — Ambulatory Visit: Payer: Medicare Other

## 2019-06-06 ENCOUNTER — Other Ambulatory Visit: Payer: Self-pay | Admitting: Cardiology

## 2019-06-06 VITALS — BP 128/50 | HR 84 | Temp 97.0°F | Ht 70.0 in | Wt 175.4 lb

## 2019-06-06 DIAGNOSIS — M7989 Other specified soft tissue disorders: Secondary | ICD-10-CM

## 2019-06-06 DIAGNOSIS — R0609 Other forms of dyspnea: Secondary | ICD-10-CM

## 2019-06-06 DIAGNOSIS — I10 Essential (primary) hypertension: Secondary | ICD-10-CM

## 2019-06-06 DIAGNOSIS — Z91199 Patient's noncompliance with other medical treatment and regimen due to unspecified reason: Secondary | ICD-10-CM

## 2019-06-06 DIAGNOSIS — Z9119 Patient's noncompliance with other medical treatment and regimen: Secondary | ICD-10-CM

## 2019-06-06 DIAGNOSIS — E782 Mixed hyperlipidemia: Secondary | ICD-10-CM

## 2019-06-06 DIAGNOSIS — E876 Hypokalemia: Secondary | ICD-10-CM

## 2019-06-06 DIAGNOSIS — I509 Heart failure, unspecified: Secondary | ICD-10-CM

## 2019-06-06 DIAGNOSIS — Z87891 Personal history of nicotine dependence: Secondary | ICD-10-CM

## 2019-06-06 DIAGNOSIS — I739 Peripheral vascular disease, unspecified: Secondary | ICD-10-CM

## 2019-06-06 DIAGNOSIS — I4510 Unspecified right bundle-branch block: Secondary | ICD-10-CM

## 2019-06-06 LAB — CMP14+EGFR
ALT: 65 IU/L — ABNORMAL HIGH (ref 0–44)
AST: 87 IU/L — ABNORMAL HIGH (ref 0–40)
Albumin/Globulin Ratio: 0.8 — ABNORMAL LOW (ref 1.2–2.2)
Albumin: 3 g/dL — ABNORMAL LOW (ref 3.7–4.7)
Alkaline Phosphatase: 110 IU/L (ref 39–117)
BUN/Creatinine Ratio: 21 (ref 10–24)
BUN: 34 mg/dL — ABNORMAL HIGH (ref 8–27)
Bilirubin Total: 0.8 mg/dL (ref 0.0–1.2)
CO2: 23 mmol/L (ref 20–29)
Calcium: 8.6 mg/dL (ref 8.6–10.2)
Chloride: 100 mmol/L (ref 96–106)
Creatinine, Ser: 1.65 mg/dL — ABNORMAL HIGH (ref 0.76–1.27)
GFR calc Af Amer: 45 mL/min/{1.73_m2} — ABNORMAL LOW (ref >59)
GFR calc non Af Amer: 39 mL/min/{1.73_m2} — ABNORMAL LOW (ref >59)
Globulin, Total: 3.7 g/dL (ref 1.5–4.5)
Glucose: 134 mg/dL — ABNORMAL HIGH (ref 65–99)
Potassium: 4.3 mmol/L (ref 3.5–5.2)
Sodium: 137 mmol/L (ref 134–144)
Total Protein: 6.7 g/dL (ref 6.0–8.5)

## 2019-06-06 LAB — MAGNESIUM: Magnesium: 2.2 mg/dL (ref 1.6–2.3)

## 2019-06-06 LAB — PRO B NATRIURETIC PEPTIDE: NT-Pro BNP: 12549 pg/mL — ABNORMAL HIGH (ref 0–486)

## 2019-06-06 MED ORDER — METOLAZONE 2.5 MG PO TABS: 2.5000 mg | ORAL_TABLET | ORAL | Status: DC

## 2019-06-06 MED ORDER — METOLAZONE 2.5 MG PO TABS: 2.5000 mg | ORAL_TABLET | ORAL | 0 refills | Status: DC

## 2019-06-06 NOTE — Patient Instructions (Addendum)
Please remember to bring in your medication bottles in at the next visit.   New Medications that were added at today's visit:  Zaroxolyn 2.5mg  po every Monday, Wednesday, and Friday.   Medications that were discontinued at today's visit: Amlodipine   Office will call you to have the following tests scheduled:  Ultrasound of legs Heart cath 06/12/2019  Please go to [facility] for xray and V/Q scan.   Daily weights and bring in the log at the next visit.   Please get labs done in about 06/13/2019 at the nearest [facility].  If shortness of breath worsen  or has chest pain patient is again encouraged to the nearest ER.  Recommend follow up with your PCP as scheduled.

## 2019-06-06 NOTE — Progress Notes (Signed)
REASON FOR CONSULT: Acute decompensated heart failure (HCC) and shortness of breath.  Chief Complaint  Patient presents with  . Shortness of Breath  . Follow-up   HPI  Kevin Reilly is a 79 y.o. male who presents to the office with a chief complaint of "shortness of breath."  Patient's past medical history and cardiac risk factors include: Carotid artery disease, recently diagnosed with congestive heart failure, peripheral artery disease with prior intervention, hyperlipidemia, hypertension, medical noncompliance.  Patient is accompanied by Kevin Reilly ([phone]), she is [name]'s is a long-term family friend. Patient provides verbal consent about discussing his medical conditions and plan of care in her presence.   Shortness of breath: Since last office visit patient states that his shortness of breath remains the same.  Majority of the symptoms are brought on by effort related activities.  At the last office visit and medication have been titrated and patient did take majority of them up and started taking them.  He also had 1 week blood work which was reviewed with both the patient and [name] at today's office visit.  After last office visit he was recommended to discontinue amlodipine due to his lower extremity swelling but he continues to take it based on the medication bottles he brought in at today's office visit.  Patient was scheduled for a left and right heart catheterization with my partner Dr. Jeanella Cara 05/31/2019; however, patient failed to follow-up.   His symptoms are concerning for acute decompensated heart failure and possible pulmonary embolism.  I discussed with him at the last office visit regarding both of these concerns and recommended hospitalization for further evaluation.  However patient refused.  I again reemphasized the importance of being hospitalized for more expedited evaluation and patient continues to refuse despite voicing understanding that if his symptoms  worsen it may lead to morbidity or mortality (even death).  Of note, he has had approximately 3 visits  at [facility] ER department and has refused inpatient hospitalization.  Patient states that his shortness of breath is mostly brought on by effort related activities.  He states for the last several months he has been out of his medications and does not have a primary care physician with whom he follows.  Patient has been tested negative for COVID-19 in the past, does have a productive cough and at times has a brown sputum production.  No fevers or chills. Review of systems positive for: shortness of breath effort related symptoms, lower extremity swelling. Currently patient denies chest pain, lightheadedness, dizziness, palpitations, orthopnea, paroxysmal nocturnal dyspnea, near syncope, syncopal events, hematochezia, hemoptysis, hematemesis, melanotic stools, no symptoms of amaurosis fugax, motor or sensory symptoms or dysphasia in the last 6 months.   History of congestive heart failure, peripheral artery disease. Denies prior history of coronary artery disease, myocardial infarction, pulmonary embolism, stroke, transient ischemic attack.  ALLERGIES: Allergies  Allergen Reactions  . Chocolate Hives  . Sulfur Rash   MEDICATION LIST PRIOR TO VISIT: Current Outpatient Medications on File Prior to Visit  Medication Sig Dispense Refill  . acetaminophen (TYLENOL) 500 MG tablet Take 1,500 mg by mouth 2 (two) times daily as needed for headache.     Marland Kitchen atorvastatin (LIPITOR) 20 MG tablet Take 1 tablet (20 mg total) by mouth daily. 90 tablet 3  . furosemide (LASIX) 20 MG tablet Take 1 tablet (20 mg total) by mouth daily. 90 tablet 1  . potassium chloride SA (KLOR-CON) 20 MEQ tablet Take 1 tablet (20  mEq total) by mouth daily. Patient may request a liquid formulation. 30 tablet 0  . ramipril (ALTACE) 10 MG capsule Take 1 capsule (10 mg total) by mouth at bedtime. 30 capsule 0   No current  facility-administered medications on file prior to visit.    PAST MEDICAL HISTORY: Past Medical History:  Diagnosis Date  . Bilateral carotid artery disease (HCC)   . CHF (congestive heart failure) (HCC)   . Critical lower limb ischemia   . Dyspnea on exertion 05/30/2019  . Hyperlipidemia    takes Atorvastatin daily  . Hypertension    takes Amlodipine and Ramipril daily  . Joint swelling   . Nocturia   . Peripheral arterial disease (HCC)     PAST SURGICAL HISTORY: Past Surgical History:  Procedure Laterality Date  . BACK SURGERY    . colonosocpy    . ENDARTERECTOMY FEMORAL Right 02/25/2015   Procedure: ENDARTERECTOMYRight  FEMORAL Endarterectomy with profundaplasty.;  Surgeon: Sherren Kerns, MD;  Location: [facility] OR;  Service: Vascular;  Laterality: Right;  . FEMORAL-POPLITEAL BYPASS GRAFT Right 02/25/2015   Procedure: BYPASS GRAFT FEMORAL-POPLITEAL ARTERY using non-reversed saphenous vein.;  Surgeon: Sherren Kerns, MD;  Location: [facility] OR;  Service: Vascular;  Laterality: Right;  . PERIPHERAL VASCULAR CATHETERIZATION N/A 01/24/2015   Procedure: Abdominal Aortogram;  Surgeon: Sherren Kerns, MD;  Location: [facility] INVASIVE CV LAB;  Service: Cardiovascular;  Laterality: N/A;    FAMILY HISTORY: The patient family history is not on file.  Denies family history of premature coronary disease or sudden cardiac death.   SOCIAL HISTORY:  The patient  reports that he has quit smoking. His smoking use included cigarettes. He has never used smokeless tobacco. He reports that he does not drink alcohol or use drugs.  14 ORGAN REVIEW OF SYSTEMS: CONSTITUTIONAL: No fever or significant weight loss EYES: No recent significant visual change EARS, NOSE, MOUTH, THROAT: No recent significant change in hearing CARDIOVASCULAR: See discussion in subjective/HPI RESPIRATORY: See discussion in subjective/HPI GASTROINTESTINAL: No recent complaints of abdominal pain GENITOURINARY: No recent significant  change in genitourinary status MUSCULOSKELETAL: No recent significant change in musculoskeletal status INTEGUMENTARY: No recent rash NEUROLOGIC: No recent significant change in motor function PSYCHIATRIC: No recent significant change in mood ENDOCRINOLOGIC: No recent significant change in endocrine status HEMATOLOGIC/LYMPHATIC: No recent significant unexpected bruising ALLERGIC/IMMUNOLOGIC: No recent unexplained allergic reaction  PHYSICAL EXAM: Vitals with BMI 06/06/2019 05/30/2019 05/27/2019  Height 5\' 10"  5\' 10"  -  Weight 175 lbs 6 oz 170 lbs -  BMI 25.17 24.39 -  Systolic 128 160  Diastolic 50 84 71  Pulse 84 96 98  Some encounter information is confidential and restricted. Go to Review Flowsheets activity to see all data.   CONSTITUTIONAL: Elderly appearing male, sitting upright, hemodynamically stable, no acute distress. SKIN: Skin is warm and dry. No rash noted. No cyanosis. No pallor. No jaundice HEAD: Normocephalic and atraumatic.  EYES: No scleral icterus MOUTH/THROAT: Dry oral membranes.  Poor dentition. NECK: JVP present. No thyromegaly noted.  Left-sided carotid bruit appreciated. LYMPHATIC: No visible cervical adenopathy.  CHEST Normal respiratory effort. No intercostal retractions  LUNGS: Decreased breath sounds at the bases.  No stridor. No wheezes.  Positive rales bilateral.  CARDIOVASCULAR: Regular rate and rhythm, positive S1-S2, soft systolic murmur heard at the left sternal border, no gallops or rubs. ABDOMINAL: Soft, nontender, nondistended, positive bowel sounds, no apparent ascites.  EXTREMITIES: Bilateral lower extremities are warm to touch, 2+ pitting edema up to the knees bilaterally.  HEMATOLOGIC: No significant bruising NEUROLOGIC: Oriented to person, place, and time. Nonfocal. Normal muscle tone.  PSYCHIATRIC: Normal mood and affect. Normal behavior. Cooperative  RADIOLOGY: 05/19/2019:Persistent diffuse bilateral interstitial opacities with peripheral  and basilar predominance, appear stable on the right and may be slightly increased on the left.  CARDIAC DATABASE: EKG: 05/19/2019 Normal sinus rhythm with a ventricular rate of 91 bpm, left atrial enlargement, normal axis, downsloping ST depressions in the inferior and lateral leads suggestive of underlying ischemia or secondary to LVH.   05/30/2015: Normal sinus rhythm with a ventricular rate of 90 bpm, left atrial enlargement.  Right bundle branch block, ST depressions in the high lateral and lateral leads suggestive of possible ischemia in the anterolateral distribution.  Echocardiogram: None  Stress Testing:  None  Heart Catheterization: None  Carotid duplex: 01/2015: Technically diffiuclt due to tortuosity and turbulent flow. - Bilateral - 40% to 59% ICA stenosis upper end of scale. Acoustic shadowing may obscure higher velocities. Turbulent flow noted. ECA stenosis. vertebral artey flow is antegrade.   Vascular imaging: Right Lower Extremity Venous Duplex Evaluation 02/2015: - No evidence of deep vein thrombosis involving the right lower extremity and left common femoral vein. Strong monophasic waveforms at the distal PTA and ATA.   DVT Study 11/07/2018:  Right: There is no evidence of deep vein thrombosis in the lower extremity. Left: No evidence of common femoral vein obstruction.   ABI 04/23/2015: Right ABI 0.81, indicating mild arterial occlusive disease at rest.  Left ABI 0.65, indicating moderate arterial occlusive disease at rest.  LABORATORY DATA: CBC Latest Ref Rng & Units 05/27/2019 05/19/2019 05/16/2019  WBC 4.0 - 10.5 K/uL 9.7 7.1 6.2  Hemoglobin 13.0 - 17.0 g/dL 10.7(L) 11.3(L) 10.5(L)  Hematocrit 39.0 - 52.0 % 34.6(L) 35.9(L) 34.8(L)  Platelets 150 - 400 K/uL 272 209 232    CMP Latest Ref Rng & Units 06/05/2019 05/27/2019 05/19/2019  Glucose 65 - 99 mg/dL 203(T) 597(C) 163(A)  BUN 8 - 27 mg/dL 45(X) 64(W) 80(H)  Creatinine 0.76 - 1.27 mg/dL 2.12(Y) 4.82(N) 0.03(B)    Sodium 134 - 144 mmol/L 137 138 140  Potassium 3.5 - 5.2 mmol/L 4.3 3.2(L) 3.4(L)  Chloride 96 - 106 mmol/L 100 102 106  CO2 20 - 29 mmol/L 23 23 20(L)  Calcium 8.6 - 10.2 mg/dL 8.6 0.4(U) 9.0  Total Protein 6.0 - 8.5 g/dL 6.7 - -  Total Bilirubin 0.0 - 1.2 mg/dL 0.8 - -  Alkaline Phos 39 - 117 IU/L 110 - -  AST 0 - 40 IU/L 87(H) - -  ALT 0 - 44 IU/L 65(H) - -    Lipid Panel     Component Value Date/Time   CHOL 174 02/10/2015 0855   TRIG 137 02/10/2015 0855   HDL 42 02/10/2015 0855   CHOLHDL 4.1 02/10/2015 0855   VLDL 27 02/10/2015 0855   LDLCALC 105 02/10/2015 0855   BNP (last 3 results) Recent Labs    05/16/19 1136 05/19/19 1641 05/27/19 1302  BNP 2,495.6* 1,756.3* 2,940.0*   FINAL MEDICATION LIST END OF ENCOUNTER: Meds ordered this encounter  Medications  . metolazone (ZAROXOLYN) tablet 2.5 mg    Medications Discontinued During This Encounter  Medication Reason  . amoxicillin-clavulanate (AUGMENTIN) 875-125 MG tablet Error  . aspirin 81 MG chewable tablet Error  . azithromycin (ZITHROMAX) 250 MG tablet Error  . amLODipine (NORVASC) 5 MG tablet Discontinued by provider     Current Outpatient Medications:  .  acetaminophen (TYLENOL) 500 MG tablet, Take  1,500 mg by mouth 2 (two) times daily as needed for headache. , Disp: , Rfl:  .  atorvastatin (LIPITOR) 20 MG tablet, Take 1 tablet (20 mg total) by mouth daily., Disp: 90 tablet, Rfl: 3 .  furosemide (LASIX) 20 MG tablet, Take 1 tablet (20 mg total) by mouth daily., Disp: 90 tablet, Rfl: 1 .  potassium chloride SA (KLOR-CON) 20 MEQ tablet, Take 1 tablet (20 mEq total) by mouth daily. Patient may request a liquid formulation., Disp: 30 tablet, Rfl: 0 .  ramipril (ALTACE) 10 MG capsule, Take 1 capsule (10 mg total) by mouth at bedtime., Disp: 30 capsule, Rfl: 0  Current Facility-Administered Medications:  .  metolazone (ZAROXOLYN) tablet 2.5 mg, 2.5 mg, Oral, Q M,W,F, Ercie Eliasen, DO  IMPRESSION:    ICD-10-CM    1. Acute decompensated heart failure (HCC)  I50.9 metolazone (ZAROXOLYN) tablet 2.5 mg    Basic Metabolic Panel (BMET)    Pro b natriuretic peptide (BNP)[facility] [facility])    Magnesium    Pro b natriuretic peptide (BNP)[facility] [facility])    Pro b natriuretic peptide (BNP)[facility] [facility])  2. Dyspnea on exertion  R06.00 metolazone (ZAROXOLYN) tablet 2.5 mg    Basic Metabolic Panel (BMET)    Pro b natriuretic peptide (BNP)[facility])    Magnesium    Novel Coronavirus, NAA ([facility])    DG Chest 1 View    NM Pulmonary Perf and Vent    CANCELED: D-Dimer, Quantitative  3. Essential hypertension  I10   4. PAD (peripheral artery disease) (HCC)  I73.9   5. Mixed hyperlipidemia  E78.2   6. Hypokalemia  E87.6   7. RBBB  I45.10   8. History of noncompliance with medical treatment  Z91.19   9. Former smoker  Z87.891   76. Swelling of lower leg  M79.89 PCV LOWER VENOUS US (BILATERAL)    CANCELED: PCV UPPER VENOUS US (BILATERAL)     RECOMMENDATIONS: Acute decompensated heart failure, NYHA class III. -Scheduled for echo to evaluate LV function coronary artery disease.  Structural heart disease. -Educated on the importance of medication compliance.  Patient is currently not on any medications and has not filled any prescription that were provided by the ER physician. -Continue Lasix 20 mg p.o. every morning. -Continue ramipril 10 mg p.o. every evening. -Despite providing written instruction at the last office visit. He continues to take amlodipine. Discontinue Amlodipine given the degree of lower extremity swelling. -Klor-Con 20 mEq p.o. daily -Add metolazone 2.5 mg p.o. q. Monday Wednesday Friday. -Not started on beta-blocker therapy due to acute decompensated heart failure. -Not started on aldosterone antagonist as the patient's compliance is unknown and his therapy requires periodic follow-up for kidney function and  electrolytes. -We will consider transitioning ramipril to Entresto depending on echo results and patient's compliance. -We will consider the addition of hydralazine/Isordil combination once he is adequately diuresed -Check CMP, magnesium level, and BNP in 1 week given the changes in medication and symptoms. -Recommend weight check and strict I's and O's. -Fluid restriction to less than 2 L/day, and sodium restrictions to less than 2 g/day. -Daily weights. -We will schedule for left heart catheterization on June 12, 2019.  We emphasized the importance of following up with the appointment was a no-show at his last left heart catheterization appointment.   -Had a detailed discussion with the patient and recommended transport to ER given the patient's presentation.  Patient continues to refuse.  Patient verbalizes understanding  that delaying cardiovascular care may predispose him to worsening morbidity and mortality.  Patient verbalized understanding that if he develops typical chest pain as discussed or has worsening symptoms as far as increasing intensity, frequency, or duration he will seek medical attention at the closest ER via EMS. -Patient is agreeable for elective left and right heart catheterization. -Screen for COVID-19 infection and patient is educated on self quarantine afterwards. -The left heart catheterization procedure was explained to the patient in details. The indication, alternatives, risks and benefits were reviewed. Complications including but not limited to bleeding, infection, acute kidney injury, blood transfusion, heart rhythm disturbances, contrast (dye) reaction, damage to the arteries or nerves in the legs or hands, cerebrovascular accident, myocardial infarction, need for emergent bypass surgery, blood clots in the legs, possible need for emergent blood transfusion, and rarely death were reviewed and discussed with the patient. The patient voices understanding and wishes to  proceed.   Dyspnea on exertion: See above -Check chest x-ray and the VQ scan.  Lower extremity swelling bilaterally: We will check lower extremity duplex to rule out DVT.  Benign essential hypertension: See above, medical therapy currently being titrated.  Peripheral artery disease with prior intervention: Started on aspirin 81 mg p.o. daily and continue statin therapy 20 mg p.o. nightly.  Will uptitrate as the patient's compliance improves.  Hypokalemia: Most recent blood work reviewed, resolved.  Continue Klor-Con 20 mEq p.o. daily  Educated on the importance of medication compliance.  Orders Placed This Encounter  Procedures  . Novel Coronavirus, NAA ([facility])  . DG Chest 1 View  . NM Pulmonary Perf and Vent  . Basic Metabolic Panel (BMET)  . Pro b natriuretic peptide (BNP)[facility] [facility])  . Magnesium  . Pro b natriuretic peptide (BNP)[facility] [facility])  . Pro b natriuretic peptide (BNP)[facility] [facility])  . PCV LOWER VENOUS US (BILATERAL)   --Continue cardiac medications as reconciled in final medication list. --Return in about 8 days (around 06/14/2019) for Discussion of test results, symptoms, and heart cath results. .. Or sooner if needed. --Continue follow-up with your primary care physician regarding the management of your other chronic comorbid conditions. -Did request the patient an alternative phone number but the only one that he provided today's office visit was [phone].  And he is okay if we also reach out to his family friend Kevin Reilly at [phone].  Patient's questions and concerns were addressed to his satisfaction. He voices understanding of the instructions provided during this encounter.   This note was created using a voice recognition software as a result there may be grammatical errors inadvertently enclosed that do not reflect the nature of this encounter. Every attempt is made to correct such  errors.  Tessa Lerner, DO, [facility] Office: [phone]

## 2019-06-07 ENCOUNTER — Other Ambulatory Visit: Payer: Self-pay | Admitting: Cardiology

## 2019-06-07 ENCOUNTER — Encounter (HOSPITAL_COMMUNITY)
Admission: RE | Admit: 2019-06-07 | Discharge: 2019-06-07 | Disposition: A | Discharge status: Home / Self Care | Payer: Medicare Other | Source: Ambulatory Visit | Attending: Cardiology | Admitting: Cardiology

## 2019-06-07 ENCOUNTER — Ambulatory Visit (HOSPITAL_COMMUNITY)
Admission: RE | Admit: 2019-06-07 | Discharge: 2019-06-07 | Disposition: A | Discharge status: Home / Self Care | Payer: Medicare Other | Source: Ambulatory Visit | Attending: Cardiology | Admitting: Cardiology

## 2019-06-07 DIAGNOSIS — R0609 Other forms of dyspnea: Secondary | ICD-10-CM

## 2019-06-07 DIAGNOSIS — R0600 Dyspnea, unspecified: Secondary | ICD-10-CM

## 2019-06-07 MED ORDER — TECHNETIUM TO 99M ALBUMIN AGGREGATED
1.5200 | Freq: Once | INTRAVENOUS | Status: AC | PRN
  Administered 2019-06-07: 13:00:00 1.52 via INTRAVENOUS

## 2019-06-07 MED ORDER — APIXABAN (ELIQUIS) VTE STARTER PACK (10MG AND 5MG): ORAL_TABLET | ORAL | 0 refills | Status: DC

## 2019-06-07 NOTE — Telephone Encounter (Signed)
Initially tried to call the patient on his cell phone number however his voicemail is full and no other number available to reach him.   Yesterday he was accompanied by family friend her name is [name] and he provided verbal consent in regards to calling her or reaching out regarding his medical conditions and treatments.  I called Ms. Carla at [phone].  I made Ms. Carla aware that Mr. Anay does have blood clots in the lower extremity which appear to be chronic.  They are within the deep system and in the setting of shortness of breath as well as lower extremity selling recommending treatment with oral anticoagulation.  Risks, benefits, and alternatives to oral anticoagulation were discussed in detail with the patient's friend and I have asked her to conveyed them to the patient as well.  They are asked to call the office sooner if any questions arise otherwise will discuss at the next visit (next week). I exquisitely informed that if the patient is involved in any injury despite the mechanism of action he needs to seek medical attention at the closest ER via EMS to rule out internal bleeding.  She verbalized understanding and will inform the patient.   Eliquis prescription is sent to their pharmacy.  They are asked to hold Eliquis 24 hours prior to the upcoming left heart catheterization.  Tessa Lerner, DO, [facility] Office: [phone]

## 2019-06-08 ENCOUNTER — Other Ambulatory Visit (HOSPITAL_COMMUNITY)
Admission: RE | Admit: 2019-06-08 | Discharge: 2019-06-08 | Disposition: A | Discharge status: Home / Self Care | Payer: Medicare Other | Source: Ambulatory Visit | Attending: Cardiology | Admitting: Cardiology

## 2019-06-08 ENCOUNTER — Telehealth: Payer: Self-pay | Admitting: Cardiology

## 2019-06-08 ENCOUNTER — Other Ambulatory Visit: Payer: Self-pay

## 2019-06-08 ENCOUNTER — Encounter (HOSPITAL_COMMUNITY): Payer: Self-pay

## 2019-06-08 ENCOUNTER — Ambulatory Visit: Payer: Medicare Other

## 2019-06-08 ENCOUNTER — Emergency Department (HOSPITAL_COMMUNITY)
Admission: EM | Admit: 2019-06-08 | Discharge: 2019-06-08 | Disposition: A | Discharge status: Home / Self Care | Payer: Medicare Other | Attending: Emergency Medicine | Admitting: Emergency Medicine

## 2019-06-08 DIAGNOSIS — Z01812 Encounter for preprocedural laboratory examination: Secondary | ICD-10-CM | POA: Diagnosis present

## 2019-06-08 DIAGNOSIS — R0609 Other forms of dyspnea: Secondary | ICD-10-CM

## 2019-06-08 DIAGNOSIS — Z5321 Procedure and treatment not carried out due to patient leaving prior to being seen by health care provider: Secondary | ICD-10-CM | POA: Diagnosis not present

## 2019-06-08 DIAGNOSIS — R0602 Shortness of breath: Secondary | ICD-10-CM | POA: Diagnosis present

## 2019-06-08 DIAGNOSIS — Z20822 Contact with and (suspected) exposure to covid-19: Secondary | ICD-10-CM | POA: Insufficient documentation

## 2019-06-08 DIAGNOSIS — R05 Cough: Secondary | ICD-10-CM

## 2019-06-08 DIAGNOSIS — R059 Cough, unspecified: Secondary | ICD-10-CM

## 2019-06-08 DIAGNOSIS — I509 Heart failure, unspecified: Secondary | ICD-10-CM

## 2019-06-08 LAB — SARS CORONAVIRUS 2 (TAT 6-24 HRS): SARS Coronavirus 2: NEGATIVE

## 2019-06-08 MED ORDER — APIXABAN (ELIQUIS) VTE STARTER PACK (10MG AND 5MG): ORAL_TABLET | ORAL | 0 refills | Status: DC

## 2019-06-08 NOTE — ED Triage Notes (Signed)
Pt reports he has "cold in his lungs" requesting an antibiotic to "knock it out" states these symptoms have been going on for a month. Feels like its hard to breathe at times.

## 2019-06-08 NOTE — Progress Notes (Signed)
Please see the earlier telephone encounter.Patient came in for an echocardiogram and was informed in person about the VQ findings as well as the chest x-ray findings.  He was strongly recommended to go to the ER for acute care given his heart failure symptoms, chronic DVTs, and V/Q findings suggestive of multifocal pulmonary emboli.   Patient states that he would go to the ER.  He refused EMS transfer.  Patient did not pick up his Eliquis from the pharmacy yet and therefore I did provide him samples in the meantime.  I also provided him a physical prescription copy so that he could take it to other pharmacies for better prices if needed.

## 2019-06-08 NOTE — ED Notes (Signed)
To x-ray

## 2019-06-08 NOTE — Progress Notes (Signed)
Please see the earlier telephone encounter.Patient came in for an echocardiogram and was informed in person about the VQ findings as well as the chest x-ray findings.  He was strongly recommended to go to the ER for acute care given his heart failure symptoms, chronic DVTs, and V/Q findings suggestive of multifocal pulmonary emboli.   Patient states that he would go to the ER.  He refused EMS transfer.  Patient did not pick up his Eliquis from the pharmacy yet and therefore I did provide him samples in the meantime.  I also provided him a physical prescription copy so that he could take it to other pharmacies for better prices if needed.

## 2019-06-08 NOTE — Telephone Encounter (Signed)
Called the patient and at the phone number but nobody picked up and the voicemail was full.  Therefore I again called her family friend Ms. [name] who he had given permission to speak to at the last visit to update her so that she could convey him of any results or updates. I called Ms. [name] to inform her of the chest x-ray and VQ scan.  I informed Ms. [name] that his chest x-ray is concerning.  Per report there is appearance of severe multi lobar bilateral pneumonia.  In addition I was called by the radiologist after he interpreted the VQ scan.  There is concern for pulmonary embolism.  Final report is pending.  I reiterated the importance of taking Mr. randell teare to the nearest hospital via EMS given his symptoms of shortness of breath and lower extremity swelling and current work-up including elevated proBNP levels, chronic DVT, and now perfusion defects on the VQ scan suggestive of PE.   Ms. Carin Hock will try to reach the patient to convince him to go to the hospital to receive appropriate medical therapy.  I had informed the patient during his last office visit that he is high risk of worsening morbidity mortality and even death if he does not receive the appropriate care in an acute setting such as a hospital for his underlying conditions.  He had verbalized understanding and had refused to go to the hospital at the last office visit.  I have asked Mr. Dilauro to give me a call if Mr. Okey has had any other additional questions or concerns.  Also informed Ms. [name] that he start his oral anticoagulation as discussed yesterday.   Tessa Lerner, DO, [facility] Office: [phone]

## 2019-06-08 NOTE — Progress Notes (Signed)
Patient is made aware of the findings. Oral anticoagulation recommended and script provided. Sample also provided until he is able to get it filled. Risk, benefits, and alternatives were discussed in detail with the patient.

## 2019-06-08 NOTE — ED Notes (Signed)
Pt refused to stay, neighbor came to pick him up

## 2019-06-09 LAB — PRO B NATRIURETIC PEPTIDE: NT-Pro BNP: 16303 pg/mL — ABNORMAL HIGH (ref 0–486)

## 2019-06-11 ENCOUNTER — Telehealth: Payer: Self-pay

## 2019-06-11 DIAGNOSIS — I5042 Chronic combined systolic (congestive) and diastolic (congestive) heart failure: Secondary | ICD-10-CM | POA: Diagnosis present

## 2019-06-11 DIAGNOSIS — I420 Dilated cardiomyopathy: Secondary | ICD-10-CM | POA: Diagnosis present

## 2019-06-11 NOTE — Telephone Encounter (Signed)
He hasn't called back- but his friend came in to pick up eliquiz copay card. She confirmed he WILL call today- have not heard anything yet. We can wait till tomorrow maybe he calls after hours.

## 2019-06-12 ENCOUNTER — Encounter (HOSPITAL_COMMUNITY): Payer: Self-pay

## 2019-06-12 ENCOUNTER — Ambulatory Visit (HOSPITAL_COMMUNITY): Admission: RE | Admit: 2019-06-12 | Payer: Medicare Other | Source: Home / Self Care | Admitting: Cardiology

## 2019-06-12 ENCOUNTER — Other Ambulatory Visit: Payer: Self-pay

## 2019-06-12 ENCOUNTER — Ambulatory Visit (HOSPITAL_COMMUNITY)
Admission: EM | Admit: 2019-06-12 | Discharge: 2019-06-12 | Disposition: A | Discharge status: Home / Self Care | Payer: Medicare Other

## 2019-06-12 DIAGNOSIS — I509 Heart failure, unspecified: Secondary | ICD-10-CM | POA: Diagnosis not present

## 2019-06-12 DIAGNOSIS — R0602 Shortness of breath: Secondary | ICD-10-CM

## 2019-06-12 SURGERY — RIGHT/LEFT HEART CATH AND CORONARY ANGIOGRAPHY
Anesthesia: LOCAL

## 2019-06-12 NOTE — ED Triage Notes (Signed)
Pt c/o SOB, cough, right shoulder pain. Per pt and caregiver, pt was dx with bilat pneumonia, DVTs in BLE and PE in lungs, pt was scheduled for left/right heart cath today but states "I want antibiotics so I can get the pneumonia taken care of first.". Reports dyspnea on exertion.   Reports hurting right shoulder approx 3 weeks ago while opening garage door.   Pt denies n/v, fever, chills.

## 2019-06-12 NOTE — Discharge Instructions (Addendum)
I advise that you go to the Emergency Department to be further evaluated at this time and that outpatient treatment is no advised at this time.

## 2019-06-12 NOTE — ED Provider Notes (Addendum)
[name]    CSN: [id number] Arrival date & time: 06/12/19  1106      History   Chief Complaint Chief Complaint  Patient presents with  . Shortness of Breath  . Cough    HPI Kevin Reilly is a 79 y.o. male.    Patient with past medical history of congestive heart failure reports urgent care today for cough, shortness of breath and concern of right shoulder pain.  Patient was most recently in the emergency department on 06/08/2019 however left without being seen by a provider after receiving triage.  He was seen prior to on 06/05/2019 by his cardiologist and at that time was recommended that he report to the emergency department due to concern of acute decompensated heart failure, pneumonia and pulmonary embolism.  At this visit with his cardiologist he was recommended to go to the emergency department for hospitalization however patient refused.  At the 2/26 emergency department trip he received chest x-ray and had an echocardiogram done that day.  Chest x-ray concerning for viral pneumonia and echocardiogram showing declining cardiac functions with left ventricular ejection fraction of less than 20%.   Patient reports to urgent care today continued cough and shortness of breath and requesting antibiotics.  He has had continued lower extremity swelling in both legs.  Denies fever and chills.  Patient was scheduled today for a left and right heart catheterization however chose to come to urgent care for antibiotics instead of attending his appointment.  Given patient history he was advised to go to the emergency department for evaluation and possible hospitalization and that he is likely in acute decompensated heart failure.  We also discussed antibiotic therapy is likely not indicated his chest x-ray was more concerning for viral pneumonia.  We also discussed that regardless outpatient therapy was not advised due to his age, current health status numerous comorbidities.  Patient  states that he is not going to go to the emergency department or hospital.  He does not give a direct answer on why he will not go.  I reiterated to the patient at this time that there is no safe treatment for me to issue him currently and that his care would be best handled at the emergency department right now.  This was also reiterated by the medical director.    Patient asked what would be done for his right shoulder pain and while asking this patient is able to move the shoulder completely.  This provider informed the patient that given his recent serious illness that the shoulder must be deferred consideration of his other health comorbidities.     Past Medical History:  Diagnosis Date  . Bilateral carotid artery disease ([place])   . CHF (congestive heart failure) ([place])   . Critical lower limb ischemia   . Dyspnea on exertion 05/30/2019  . Hyperlipidemia    takes Atorvastatin daily  . Hypertension    takes Amlodipine and Ramipril daily  . Joint swelling   . Nocturia   . Peripheral arterial disease [facility])     Patient Active Problem List   Diagnosis Date Noted  . Dilated cardiomyopathy ([place]) 06/11/2019  . Chronic combined systolic and diastolic heart failure ([place]) 06/11/2019  . Mixed hyperlipidemia 05/30/2019  . Dyspnea on exertion 05/30/2019  . Acute decompensated heart failure ([place]) 05/19/2019  . PAD (peripheral artery disease) ([place]) 02/25/2015  . Bilateral carotid artery disease ([place]) 02/05/2015  . Essential hypertension 01/01/2014  . Critical lower limb ischemia 01/01/2014  .  Bilateral lower extremity edema 01/01/2014    Past Surgical History:  Procedure Laterality Date  . BACK SURGERY    . colonosocpy    . ENDARTERECTOMY FEMORAL Right 02/25/2015   Procedure: ENDARTERECTOMYRight  FEMORAL Endarterectomy with profundaplasty.;  Surgeon: Sherren Kerns, MD;  Location: [facility];  Service: Vascular;  Laterality: Right;  . FEMORAL-POPLITEAL BYPASS GRAFT Right 02/25/2015    Procedure: BYPASS GRAFT FEMORAL-POPLITEAL ARTERY using non-reversed saphenous vein.;  Surgeon: Sherren Kerns, MD;  Location: [facility] OR;  Service: Vascular;  Laterality: Right;  . PERIPHERAL VASCULAR CATHETERIZATION N/A 01/24/2015   Procedure: Abdominal Aortogram;  Surgeon: Sherren Kerns, MD;  Location: [facility] INVASIVE CV LAB;  Service: Cardiovascular;  Laterality: N/A;       Home Medications    Prior to Admission medications   Medication Sig Start Date End Date Taking? Authorizing Provider  Apixaban Starter Pack (ELIQUIS STARTER PACK) 5 MG TBPK Take as directed on package: start with two-5mg  tablets twice daily for 7 days. On day 8, switch to one-5mg  tablet twice daily. 06/08/19  Yes Tolia, Sunit, DO  aspirin 81 MG chewable tablet Chew by mouth daily.   Yes [provider]  atorvastatin (LIPITOR) 20 MG tablet Take 1 tablet (20 mg total) by mouth daily. 05/30/19  Yes Tolia, Sunit, DO  furosemide (LASIX) 20 MG tablet Take 1 tablet (20 mg total) by mouth daily. 05/30/19  Yes Tolia, Sunit, DO  metolazone (ZAROXOLYN) 2.5 MG tablet Take 1 tablet (2.5 mg total) by mouth every Monday, Wednesday, and Friday. 06/06/19 07/06/19 Yes Tolia, Sunit, DO  potassium chloride SA (KLOR-CON) 20 MEQ tablet Take 1 tablet (20 mEq total) by mouth daily. Patient may request a liquid formulation. 05/30/19  Yes Tolia, Sunit, DO  ramipril (ALTACE) 10 MG capsule Take 1 capsule (10 mg total) by mouth at bedtime. 05/30/19  Yes Tolia, Sunit, DO  acetaminophen (TYLENOL) 500 MG tablet Take 1,500 mg by mouth 2 (two) times daily as needed for headache.     [provider]    Family History History reviewed. No pertinent family history.  Social History Social History   Tobacco Use  . Smoking status: Former Smoker    Types: Cigarettes  . Smokeless tobacco: Never Used  Substance Use Topics  . Alcohol use: No    Alcohol/week: 0.0 standard drinks  . Drug use: No     Allergies   Chocolate and  Sulfur   Review of Systems Review of Systems  Constitutional: Negative for chills and fever.  HENT: Negative.   Respiratory: Positive for cough and shortness of breath.   Cardiovascular: Positive for leg swelling. Negative for chest pain and palpitations.  Gastrointestinal: Negative.   Musculoskeletal: Negative.   Skin: Positive for color change.  Neurological: Negative for headaches.     Physical Exam Triage Vital Signs ED Triage Vitals  Enc Vitals Group     BP 06/12/19 1127 124/68     Pulse Rate 06/12/19 1127 88     Resp 06/12/19 1127 18     Temp 06/12/19 1127 (!) 97.2 F (36.2 C)     Temp Source 06/12/19 1127 Oral     SpO2 06/12/19 1127 94 %     Weight --      Height --      Head Circumference --      Peak Flow --      Pain Score 06/12/19 1125 10     Pain Loc --      Pain Edu? --  Excl. in GC? --    No data found.  Updated Vital Signs BP 124/68 (BP Location: Left Arm)   Pulse 88   Temp (!) 97.2 F (36.2 C) (Oral)   Resp 18   SpO2 94%   Visual Acuity Right Eye Distance:   Left Eye Distance:   Bilateral Distance:    Right Eye Near:   Left Eye Near:    Bilateral Near:     Physical Exam Vitals and nursing note reviewed.  Constitutional:      Appearance: He is well-developed. He is ill-appearing.     Comments: Patient is a ill appearing elderly man seated in a wheelchair in the exam room  HENT:     Head: Normocephalic and atraumatic.  Eyes:     Extraocular Movements: Extraocular movements intact.     Conjunctiva/sclera: Conjunctivae normal.     Pupils: Pupils are equal, round, and reactive to light.  Cardiovascular:     Rate and Rhythm: Normal rate and regular rhythm.     Heart sounds: No murmur.  Pulmonary:     Effort: Pulmonary effort is normal. No respiratory distress.     Breath sounds: Normal breath sounds.     Comments: Patient is not in respiratory distress.  Lungs appear clear to auscultation however patient is not moving significant  amount of air.  He is saturating at 94% on room air currently Abdominal:     Palpations: Abdomen is soft.     Tenderness: There is no abdominal tenderness.  Musculoskeletal:     Cervical back: Neck supple.     Comments: There is 2+ pitting edema bilaterally to the knee.  Skin with erythema and tenderness on palpation bilaterally  Skin:    General: Skin is warm and dry.  Neurological:     General: No focal deficit present.     Mental Status: He is alert.  Psychiatric:        Mood and Affect: Mood normal.        Behavior: Behavior normal.      UC Treatments / Results  Labs (all labs ordered are listed, but only abnormal results are displayed) Labs Reviewed - No data to display  proBNP on 04/03/2020 was 12,549, and 16,303 on 06/08/2019 Creatinine from BMP on 05/27/2019 1.39 up to 1.65 06/05/2019 CMP from 06/05/2019 with LFT derangement EKG   Radiology No results found.  06/07/2019 CXR CLINICAL DATA:  79 year old male with history of shortness of breath. Dyspnea on exertion.  EXAM: CHEST - 2 VIEW  COMPARISON:  Chest x-ray 05/27/2019.  FINDINGS: Patchy multifocal ill-defined airspace opacities and areas of interstitial prominence throughout the mid to lower lungs bilaterally, concerning for severe multilobar pneumonia. Overall, no significant change in aeration. No pleural effusions. No evidence of pulmonary edema. No pneumothorax. Heart size is upper limits of normal. Upper mediastinal contours are within normal limits. Aortic atherosclerosis.  IMPRESSION: 1. The appearance the chest is concerning for severe multilobar bilateral pneumonia, likely from viral infection, with overall unchanged aeration compared to the prior study. 2. Aortic atherosclerosis.  Echocardiogram 06/08/2019:  Severely depressed LV systolic function with visual EF <20%. Left  ventricle cavity is visually dilated in size. Dilated cardiomyopathy.  Hypokinetic global wall motion. Doppler  evidence of grade II diastolic  dysfunction, elevated LAP. Mild left ventricular hypertrophy. Calculated  EF 22%.  Biatrial dilatation.  Right ventricle cavity visually appears dilated with reduced systolic  function.  Mild (Grade I) mitral regurgitation.  Moderate to severe tricuspid regurgitation.  Mild to moderate pulmonary hypertension. RVSP measures 51 mmHg.  IVC is dilated with a respiratory response of <50%.  No prior study for comparison.   Pulmonary perfusion study from 02/04/2020 IMPRESSION: Multiple bilateral peripheral perfusion defects. Differential includes multifocal pneumonia versus multifocal pulmonary emboli. Favor pulmonary emboli. Recommend CTA (pulmonary angiogram) to evaluate for pulmonary emboli versus pneumonia.  06/06/2019 lower extremity venous Doppler results Lower Extremity Venous Duplex Bilateral 06/06/2019:  Cannot exclude partially occluding thrombosis of the right posterior  tibial vein with normal venous return.  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  Patient was made aware and anticoagulation recommended. Procedures Procedures (including critical care time)  Medications Ordered in UC Medications - No data to display  Initial Impression / Assessment and Plan / UC Course  I have reviewed the triage vital signs and the nursing notes.  Pertinent labs & imaging results that were available during my care of the patient were reviewed by me and considered in my medical decision making (see chart for details).  Significant time was spent reviewing patient's chart to include recent emergency department and cardiology notes 06/05/2019 and 06/08/2019.  His chest x-ray imaging on 2/26 was reviewed, as well as echocardiogram conducted on 06/08/2019.      #Shortness of breath Patient is a 79 year old with acute decompensated heart failure, suspected viral pneumonia versus multiple pulmonary  emboli presenting today for continued cough and shortness of breath.  Given elaborate recent work-up and serious concern for morbidity and mortality patient was advised numerous times that he needs care in the emergency department with likely hospital admission.  It was explained to him that there is no outpatient therapy that we can prescribe that will benefit him greatly and that any outpatient therapy may actually cause more harm as he requires supervised inpatient treatment at this time.  This provider explained that there is serious concern that his life is at risk if he does not seek care at the emergency department/hospital as he is in acute heart failure and without treatment there he will not get better.  Patient continues to refuse this advise.  Patient has a follow-up with cardiologist on 06/15/2019.  Discussion with family friend who is serving as a caretaker about contacting palliative care if the patient so wishes.   -Patient acknowledges that he is leaving the urgent care and going home against our medical advice that he seek care at the emergency department. -His caretaker also acknowledges that the patient is going against our medical advice to seek care at the hospital/emergency department. -It is also worth noting patient was evaluated multiple times through February the emergency department and once at night January for similar presenting symptoms.  Shown that he was diagnosed at some point early in January with COVID-19.  He was also trialed on antibiotics after he 05/16/2019 emergency department visit however it is unclear if he ever filled this prescription as cardiology note on 06/05/2019 notes patient has not old prescriptions from emergency departments.  Final Clinical Impressions(s) / UC Diagnoses   Final diagnoses:  Shortness of breath  Acute decompensated heart failure [facility])     Discharge Instructions     I advise that you go to the Emergency Department to be further evaluated  at this time and that outpatient treatment is no advised at this time.       ED Prescriptions    None     PDMP not reviewed this encounter.  Hermelinda Medicus, PA-C 06/12/19 2311    Praveen Coia, Veryl Speak, PA-C 06/12/19 2318

## 2019-06-13 NOTE — Telephone Encounter (Signed)
In error.   ST

## 2019-06-14 ENCOUNTER — Other Ambulatory Visit: Payer: Medicare Other

## 2019-06-15 ENCOUNTER — Encounter: Payer: Self-pay | Admitting: Cardiology

## 2019-06-15 ENCOUNTER — Other Ambulatory Visit: Payer: Self-pay

## 2019-06-15 ENCOUNTER — Ambulatory Visit: Payer: Medicare Other | Admitting: Cardiology

## 2019-06-15 VITALS — BP 126/66 | HR 91 | Temp 97.9°F | Resp 16 | Ht 71.0 in | Wt 164.0 lb

## 2019-06-15 DIAGNOSIS — R0609 Other forms of dyspnea: Secondary | ICD-10-CM

## 2019-06-15 DIAGNOSIS — I739 Peripheral vascular disease, unspecified: Secondary | ICD-10-CM

## 2019-06-15 DIAGNOSIS — I5041 Acute combined systolic (congestive) and diastolic (congestive) heart failure: Secondary | ICD-10-CM

## 2019-06-15 DIAGNOSIS — Z91199 Patient's noncompliance with other medical treatment and regimen due to unspecified reason: Secondary | ICD-10-CM

## 2019-06-15 DIAGNOSIS — I10 Essential (primary) hypertension: Secondary | ICD-10-CM

## 2019-06-15 DIAGNOSIS — E782 Mixed hyperlipidemia: Secondary | ICD-10-CM

## 2019-06-15 DIAGNOSIS — I82552 Chronic embolism and thrombosis of left peroneal vein: Secondary | ICD-10-CM

## 2019-06-15 DIAGNOSIS — I428 Other cardiomyopathies: Secondary | ICD-10-CM

## 2019-06-15 DIAGNOSIS — Z87891 Personal history of nicotine dependence: Secondary | ICD-10-CM

## 2019-06-15 DIAGNOSIS — Z9119 Patient's noncompliance with other medical treatment and regimen: Secondary | ICD-10-CM

## 2019-06-15 DIAGNOSIS — I4510 Unspecified right bundle-branch block: Secondary | ICD-10-CM

## 2019-06-15 NOTE — Patient Instructions (Signed)
Please remember to bring in your medication bottles in at the next visit.   New Medications that were added at today's visit:  None  Medications that were discontinued at today's visit: None  Office will call you to have the following tests scheduled:  None  Please get labs done today and in 2 weeks prior to the next visit at the nearest [facility].  Recommend follow up with your PCP as scheduled.

## 2019-06-15 NOTE — Progress Notes (Signed)
REASON FOR CONSULT: Acute decompensated heart failure (HCC) and shortness of breath.  Chief Complaint  Patient presents with  . Shortness of Breath    Follow up  . Cardiomyopathy    CHF   HPI  Kevin Reilly is a 79 y.o. male who presents to the office with a chief complaint of "shortness of breath."  Patient's past medical history and cardiac risk factors include: Carotid artery disease, recently diagnosed with congestive heart failure, peripheral artery disease with prior intervention, hyperlipidemia, hypertension, cardiomyopathy, DVT, PE, medical noncompliance.  Patient is accompanied by [name] ([phone]), she is Ms. Corning's is a long-term family friend. Patient provides verbal consent about discussing his medical conditions and plan of care in her presence.   Shortness of breath: Since last office visit patient states that his shortness of breath has improved.  He no longer experiences conversational dyspnea but still has a degree of orthopnea paroxysmal nocturnal dyspnea and lower extremity swelling.  Patient states that he is compliant with his diuretic therapy.  He forgot to get his blood work done prior to today's office visit and he did not bring his medications with him either.  Since the last office visit he has lost approximately 10 pounds.  He has started on Eliquis for his underlying diagnosis of DVT/PE.  In the interim, he was also scheduled for left and right heart catheterization to further define his coronary anatomy and hemodynamics.  However, patient did not come to his scheduled procedure. Patient underwent an echocardiogram since last office visit. I reviewed with images with him and his caregiver at today's office visit.  Due to the new diagnosis of cardiomyopathy we did discuss possibly considering LifeVest in the interim but he refuses it for now and will contact us back if he changes his mind.   During today's office visit the pain we educated the patient and  his caregiver on the importance of being hospitalized for the care of his underlying acute decompensated heart failure, cardiomyopathy, recent diagnosis of possible PE as per VQ scan and possible parenteral antibiotics for pneumonia.  However patient refuses to be hospitalized.  He verbalized understanding that he is at an elevated risk for worsening morbidity, mortality. Review of systems positive for: shortness of breath effort related symptoms (improving), lower extremity swelling (improving). Currently patient denies chest pain, lightheadedness, dizziness, palpitations, orthopnea, paroxysmal nocturnal dyspnea, near syncope, syncopal events, hematochezia, hemoptysis, hematemesis, melanotic stools, no symptoms of amaurosis fugax, motor or sensory symptoms or dysphasia in the last 6 months.   ALLERGIES: Allergies  Allergen Reactions  . Chocolate Hives  . Sulfur Rash   MEDICATION LIST PRIOR TO VISIT: Current Outpatient Medications on File Prior to Visit  Medication Sig Dispense Refill  . acetaminophen (TYLENOL) 500 MG tablet Take 1,500 mg by mouth 2 (two) times daily as needed for headache.     . Apixaban Starter Pack (ELIQUIS STARTER PACK) 5 MG TBPK Take as directed on package: start with two-5mg  tablets twice daily for 7 days. On day 8, switch to one-5mg  tablet twice daily. 1 each 0  . aspirin 81 MG chewable tablet Chew by mouth daily.    Marland Kitchen atorvastatin (LIPITOR) 20 MG tablet Take 1 tablet (20 mg total) by mouth daily. 90 tablet 3  . furosemide (LASIX) 20 MG tablet Take 1 tablet (20 mg total) by mouth daily. 90 tablet 1  . metolazone (ZAROXOLYN) 2.5 MG tablet Take 1 tablet (2.5 mg total) by mouth every Monday, Wednesday, and Friday.  12 tablet 0  . potassium chloride SA (KLOR-CON) 20 MEQ tablet Take 1 tablet (20 mEq total) by mouth daily. Patient may request a liquid formulation. 30 tablet 0  . ramipril (ALTACE) 10 MG capsule Take 1 capsule (10 mg total) by mouth at bedtime. 30 capsule 0   No  current facility-administered medications on file prior to visit.    PAST MEDICAL HISTORY: Past Medical History:  Diagnosis Date  . Bilateral carotid artery disease (HCC)   . Cardiomyopathy (HCC) 05/2019  . CHF (congestive heart failure) (HCC)   . Critical lower limb ischemia   . DVT (deep venous thrombosis) (HCC)   . Dyspnea on exertion 05/30/2019  . Hyperlipidemia    takes Atorvastatin daily  . Hypertension    takes Amlodipine and Ramipril daily  . Joint swelling   . Nocturia   . Peripheral arterial disease (HCC)   . Pulmonary embolism (HCC)    Refer to VQ scan.     PAST SURGICAL HISTORY: Past Surgical History:  Procedure Laterality Date  . BACK SURGERY    . colonosocpy    . ENDARTERECTOMY FEMORAL Right 02/25/2015   Procedure: ENDARTERECTOMYRight  FEMORAL Endarterectomy with profundaplasty.;  Surgeon: Sherren Kerns, MD;  Location: [facility] OR;  Service: Vascular;  Laterality: Right;  . FEMORAL-POPLITEAL BYPASS GRAFT Right 02/25/2015   Procedure: BYPASS GRAFT FEMORAL-POPLITEAL ARTERY using non-reversed saphenous vein.;  Surgeon: Sherren Kerns, MD;  Location: [facility] OR;  Service: Vascular;  Laterality: Right;  . PERIPHERAL VASCULAR CATHETERIZATION N/A 01/24/2015   Procedure: Abdominal Aortogram;  Surgeon: Sherren Kerns, MD;  Location: [facility] INVASIVE CV LAB;  Service: Cardiovascular;  Laterality: N/A;    FAMILY HISTORY: The patient family history is not on file.  Denies family history of premature coronary disease or sudden cardiac death.   SOCIAL HISTORY:  The patient  reports that he has quit smoking. His smoking use included cigarettes. He has never used smokeless tobacco. He reports that he does not drink alcohol or use drugs.  14 ORGAN REVIEW OF SYSTEMS: CONSTITUTIONAL: No fever or significant weight loss EYES: No recent significant visual change EARS, NOSE, MOUTH, THROAT: No recent significant change in hearing CARDIOVASCULAR: See discussion in  subjective/HPI RESPIRATORY: See discussion in subjective/HPI GASTROINTESTINAL: No recent complaints of abdominal pain GENITOURINARY: No recent significant change in genitourinary status MUSCULOSKELETAL: No recent significant change in musculoskeletal status INTEGUMENTARY: No recent rash NEUROLOGIC: No recent significant change in motor function PSYCHIATRIC: No recent significant change in mood ENDOCRINOLOGIC: No recent significant change in endocrine status HEMATOLOGIC/LYMPHATIC: No recent significant unexpected bruising ALLERGIC/IMMUNOLOGIC: No recent unexplained allergic reaction  PHYSICAL EXAM: Vitals with BMI 06/15/2019 06/12/2019 06/08/2019  Height 5\' 11"  - 5\' 11"   Weight 164 lbs - 150 lbs  BMI 22.88 - 20.93  Systolic 126 124  Diastolic 66 68 72  Pulse 91 88 92  Some encounter information is confidential and restricted. Go to Review Flowsheets activity to see all data.   CONSTITUTIONAL: Elderly appearing male, sitting upright, hemodynamically stable, no acute distress. SKIN: Skin is warm and dry. No rash noted. No cyanosis. No pallor. No jaundice HEAD: Normocephalic and atraumatic.  EYES: No scleral icterus MOUTH/THROAT: Dry oral membranes.  Poor dentition. NECK: JVP present. No thyromegaly noted.  Left-sided carotid bruit appreciated. LYMPHATIC: No visible cervical adenopathy.  CHEST Normal respiratory effort. No intercostal retractions  LUNGS: Decreased breath sounds at the bases.  No stridor. No wheezes.  Positive rales bilateral.  CARDIOVASCULAR: Regular rate and rhythm, positive S1-S2,  soft systolic murmur heard at the left sternal border, no gallops or rubs. ABDOMINAL: Soft, nontender, nondistended, positive bowel sounds, no apparent ascites.  EXTREMITIES: Bilateral lower extremities are warm to touch, 2+ pitting edema up to the knees bilaterally. HEMATOLOGIC: No significant bruising NEUROLOGIC: Oriented to person, place, and time. Nonfocal. Normal muscle tone.   PSYCHIATRIC: Normal mood and affect. Normal behavior. Cooperative  RADIOLOGY: 05/19/2019:Persistent diffuse bilateral interstitial opacities with peripheral and basilar predominance, appear stable on the right and may be slightly increased on the left.  VQ Scan 05/2019: Multiple bilateral peripheral perfusion defects. Differential includes multifocal pneumonia versus multifocal pulmonary emboli. Favor pulmonary emboli. Recommend CTA (pulmonary angiogram) to evaluate for pulmonary emboli versus pneumonia.  CARDIAC DATABASE: EKG: 05/19/2019 Normal sinus rhythm with a ventricular rate of 91 bpm, left atrial enlargement, normal axis, downsloping ST depressions in the inferior and lateral leads suggestive of underlying ischemia or secondary to LVH.   05/30/2015: Normal sinus rhythm with a ventricular rate of 90 bpm, left atrial enlargement.  Right bundle branch block, ST depressions in the high lateral and lateral leads suggestive of possible ischemia in the anterolateral distribution.  Echocardiogram: 06/08/2019: Severely depressed LV systolic function with visual EF <20%. Left ventricle cavity is visually dilated in size. Dilated cardiomyopathy. Hypokinetic global wall motion. Doppler evidence of grade II diastolic dysfunction, elevated LAP. Mild left ventricular hypertrophy. Calculated EF 22%. Biatrial dilatation. Right ventricle cavity visually appears dilated with reduced systolic function. Mild (Grade I) mitral regurgitation. Moderate to severe tricuspid regurgitation. Mild to moderate pulmonary hypertension. RVSP measures 51 mmHg. IVC is dilated with a respiratory response of <50%.   Stress Testing:  None  Heart Catheterization: None  Carotid duplex: 01/2015: Technically diffiuclt due to tortuosity and turbulent flow. - Bilateral - 40% to 59% ICA stenosis upper end of scale. Acoustic shadowing may obscure higher velocities. Turbulent flow noted. ECA stenosis. vertebral artey flow is  antegrade.   Vascular imaging: Right Lower Extremity Venous Duplex Evaluation 02/2015: - No evidence of deep vein thrombosis involving the right lower extremity and left common femoral vein. Strong monophasic waveforms at the distal PTA and ATA.    DVT Study 11/07/2018:  Right: There is no evidence of deep vein thrombosis in the lower extremity. Left: No evidence of common femoral vein obstruction.   US Duplex LE Bilateral 06/06/2019: Cannot exclude partially occluding thrombosis of the right posterior tibial vein with normal venous return. Totally occluding chronic thrombosis of the left femoral and popliteal veins with no venous return. Patient was made aware and anticoagulation recommended.  ABI 04/23/2015: Right ABI 0.81, indicating mild arterial occlusive disease at rest.  Left ABI 0.65, indicating moderate arterial occlusive disease at rest.  LABORATORY DATA: CBC Latest Ref Rng & Units 05/27/2019 05/19/2019 05/16/2019  WBC 4.0 - 10.5 K/uL 9.7 7.1 6.2  Hemoglobin 13.0 - 17.0 g/dL 10.7(L) 11.3(L) 10.5(L)  Hematocrit 39.0 - 52.0 % 34.6(L) 35.9(L) 34.8(L)  Platelets 150 - 400 K/uL 272 209 232    CMP Latest Ref Rng & Units 06/05/2019 05/27/2019 05/19/2019  Glucose 65 - 99 mg/dL 725(D) 664(Q) 034(V)  BUN 8 - 27 mg/dL 42(V) 95(G) 38(V)  Creatinine 0.76 - 1.27 mg/dL 5.64(P) 3.29(J) 1.88(C)  Sodium 134 - 144 mmol/L 137 138 140  Potassium 3.5 - 5.2 mmol/L 4.3 3.2(L) 3.4(L)  Chloride 96 - 106 mmol/L 100 102 106  CO2 20 - 29 mmol/L 23 23 20(L)  Calcium 8.6 - 10.2 mg/dL 8.6 1.6(S) 9.0  Total Protein 6.0 - 8.5  g/dL 6.7 - -  Total Bilirubin 0.0 - 1.2 mg/dL 0.8 - -  Alkaline Phos 39 - 117 IU/L 110 - -  AST 0 - 40 IU/L 87(H) - -  ALT 0 - 44 IU/L 65(H) - -    Lipid Panel     Component Value Date/Time   CHOL 174 02/10/2015 0855   TRIG 137 02/10/2015 0855   HDL 42 02/10/2015 0855   CHOLHDL 4.1 02/10/2015 0855   VLDL 27 02/10/2015 0855   LDLCALC 105 02/10/2015 0855   BNP (last 3 results) Recent  Labs    05/16/19 1136 05/19/19 1641 05/27/19 1302  BNP 2,495.6* 1,756.3* 2,940.0*   FINAL MEDICATION LIST END OF ENCOUNTER: No orders of the defined types were placed in this encounter.   There are no discontinued medications.   Current Outpatient Medications:  .  acetaminophen (TYLENOL) 500 MG tablet, Take 1,500 mg by mouth 2 (two) times daily as needed for headache. , Disp: , Rfl:  .  Apixaban Starter Pack (ELIQUIS STARTER PACK) 5 MG TBPK, Take as directed on package: start with two-5mg  tablets twice daily for 7 days. On day 8, switch to one-5mg  tablet twice daily., Disp: 1 each, Rfl: 0 .  aspirin 81 MG chewable tablet, Chew by mouth daily., Disp: , Rfl:  .  atorvastatin (LIPITOR) 20 MG tablet, Take 1 tablet (20 mg total) by mouth daily., Disp: 90 tablet, Rfl: 3 .  furosemide (LASIX) 20 MG tablet, Take 1 tablet (20 mg total) by mouth daily., Disp: 90 tablet, Rfl: 1 .  metolazone (ZAROXOLYN) 2.5 MG tablet, Take 1 tablet (2.5 mg total) by mouth every Monday, Wednesday, and Friday., Disp: 12 tablet, Rfl: 0 .  potassium chloride SA (KLOR-CON) 20 MEQ tablet, Take 1 tablet (20 mEq total) by mouth daily. Patient may request a liquid formulation., Disp: 30 tablet, Rfl: 0 .  ramipril (ALTACE) 10 MG capsule, Take 1 capsule (10 mg total) by mouth at bedtime., Disp: 30 capsule, Rfl: 0  IMPRESSION:    ICD-10-CM   1. Acute combined systolic and diastolic heart failure (HCC)  F75.10 Basic Metabolic Panel (BMET)    Magnesium    Pro b natriuretic peptide (BNP)[facility])    Basic Metabolic Panel (BMET)    Magnesium    Pro b natriuretic peptide (BNP)[facility] [facility])    Pro b natriuretic peptide (BNP)[facility])    Magnesium    Basic Metabolic Panel (BMET)  2. Dyspnea on exertion  R06.00   3. Essential hypertension  I10   4. PAD (peripheral artery disease) (HCC)  I73.9   5. Mixed hyperlipidemia  E78.2   6. RBBB  I45.10   7. Chronic  deep vein thrombosis (DVT) of left peroneal and popliteal vein (HCC)  I82.552   8. Former smoker  Z87.891   79. History of noncompliance with medical treatment  Z91.19      RECOMMENDATIONS: Acute decompensated heart failure with reduced EF, NYHA class III.  Echocardiogram results and images reviewed with the patient and his caregiver.  Discussed considering LifeVest given his severely reduced left ventricular systolic function.  However, patient refuses and will call the office if he changes his mind.  Continue guideline directed medical therapy.  Not on beta-blocker therapy given his acute decompensated heart failure.  I am not uptitrating medical therapy today as he did not get blood work done prior to the office visit and he has not brought his medication bottles with him either.  We will check BMP, magnesium level, and NT proBNP today at [facility]  Not started on aldosterone antagonist as the patient's compliance is unknown and his therapy requires periodic follow-up for kidney function and electrolytes.  We will consider transitioning ramipril to [facility] depending on echo results and patient's compliance.  We will consider the addition of hydralazine/Isordil combination once he is adequately diuresed  Check CMP, magnesium level, and BNP in 2 weeks prior to the next office visit.  Recommend weight check and strict I's and O's.  Fluid restriction to less than 2 L/day, and sodium restrictions to less than 2 g/day.  Daily weights.  Cardiomyopathy etiology unknown:  Patient refuses to undergo left heart catheterization despite having it scheduled twice and patient was a no-show both times.  For now recommend conservative management as per patient's goals of care.  Medications reconciled and will uptitrate once is labs are completed.  Dyspnea on exertion: Multifactorial and improving.  Continue diuretic therapy. Continue Eliquis for his underlying chronic DVTs and recent VQ  scan suggestive of pulmonary embolism.   Deep venous thrombosis: Continue Eliquis.  Patient does not endorse any evidence of bleeding.  Benign essential hypertension: See above, medical therapy currently being titrated.  Peripheral artery disease with prior intervention: Continue aspirin 81 mg p.o. daily and continue statin therapy 20 mg p.o. nightly.  Hypokalemia: Currently on Klor-Con 20 mEq p.o. daily. Check BMP  Educated on the importance of medication compliance.  Orders Placed This Encounter  Procedures  . Basic Metabolic Panel (BMET)  . Magnesium  . Pro b natriuretic peptide (BNP)[facility])  . Basic Metabolic Panel (BMET)  . Magnesium  . Pro b natriuretic peptide (BNP)[facility] [facility])   --Continue cardiac medications as reconciled in final medication list. --Return in about 2 weeks (around 06/29/2019).. Or sooner if needed. --Continue follow-up with your primary care physician regarding the management of your other chronic comorbid conditions. -Did request the patient an alternative phone number but the only one that he provided today's office visit was [phone].  And he is okay if we also reach out to his family friend [name] at [phone].  Evaluation and management ( ) with time spent obtaining history, performing exam, reviewing labs, studies, outside records, greater than 50% of that time was spent in counseling and coordination care with the patient regarding complex decision making and discussion as state above, and documenting clinical evaluation.  Patient's questions and concerns were addressed to his satisfaction. He voices understanding of the instructions provided during this encounter.   This note was created using a voice recognition software as a result there may be grammatical errors inadvertently enclosed that do not reflect the nature of this encounter. Every attempt is made to correct such errors.  Tessa Lerner, DO, [facility] Office: [phone]

## 2019-06-22 ENCOUNTER — Other Ambulatory Visit: Payer: Self-pay | Admitting: Cardiology

## 2019-06-22 DIAGNOSIS — I509 Heart failure, unspecified: Secondary | ICD-10-CM

## 2019-06-22 DIAGNOSIS — I10 Essential (primary) hypertension: Secondary | ICD-10-CM

## 2019-06-22 LAB — BASIC METABOLIC PANEL
BUN/Creatinine Ratio: 26 — ABNORMAL HIGH (ref 10–24)
BUN: 38 mg/dL — ABNORMAL HIGH (ref 8–27)
CO2: 23 mmol/L (ref 20–29)
Calcium: 9.1 mg/dL (ref 8.6–10.2)
Chloride: 97 mmol/L (ref 96–106)
Creatinine, Ser: 1.49 mg/dL — ABNORMAL HIGH (ref 0.76–1.27)
GFR calc Af Amer: 51 mL/min/{1.73_m2} — ABNORMAL LOW (ref >59)
GFR calc non Af Amer: 44 mL/min/{1.73_m2} — ABNORMAL LOW (ref >59)
Glucose: 110 mg/dL — ABNORMAL HIGH (ref 65–99)
Potassium: 4.8 mmol/L (ref 3.5–5.2)
Sodium: 134 mmol/L (ref 134–144)

## 2019-06-22 LAB — PRO B NATRIURETIC PEPTIDE: NT-Pro BNP: 8187 pg/mL — ABNORMAL HIGH (ref 0–486)

## 2019-06-22 LAB — MAGNESIUM: Magnesium: 1.6 mg/dL (ref 1.6–2.3)

## 2019-06-27 ENCOUNTER — Other Ambulatory Visit: Payer: Self-pay

## 2019-06-27 ENCOUNTER — Other Ambulatory Visit: Payer: Self-pay | Admitting: Cardiology

## 2019-06-27 DIAGNOSIS — I5032 Chronic diastolic (congestive) heart failure: Secondary | ICD-10-CM

## 2019-06-27 DIAGNOSIS — I10 Essential (primary) hypertension: Secondary | ICD-10-CM

## 2019-06-27 DIAGNOSIS — I509 Heart failure, unspecified: Secondary | ICD-10-CM

## 2019-06-28 ENCOUNTER — Ambulatory Visit: Payer: Medicare Other | Admitting: Cardiology

## 2019-06-28 ENCOUNTER — Other Ambulatory Visit: Payer: Self-pay | Admitting: Cardiology

## 2019-07-09 ENCOUNTER — Emergency Department (HOSPITAL_COMMUNITY): Payer: Medicare Other

## 2019-07-09 ENCOUNTER — Other Ambulatory Visit: Payer: Self-pay

## 2019-07-09 ENCOUNTER — Ambulatory Visit: Payer: Medicare Other | Admitting: Cardiology

## 2019-07-09 ENCOUNTER — Emergency Department (HOSPITAL_COMMUNITY)
Admission: EM | Admit: 2019-07-09 | Discharge: 2019-07-09 | Discharge status: Against Medical Advice | Payer: Medicare Other | Attending: Emergency Medicine | Admitting: Emergency Medicine

## 2019-07-09 DIAGNOSIS — Z7901 Long term (current) use of anticoagulants: Secondary | ICD-10-CM | POA: Diagnosis not present

## 2019-07-09 DIAGNOSIS — Y929 Unspecified place or not applicable: Secondary | ICD-10-CM | POA: Diagnosis not present

## 2019-07-09 DIAGNOSIS — Y999 Unspecified external cause status: Secondary | ICD-10-CM | POA: Diagnosis not present

## 2019-07-09 DIAGNOSIS — E871 Hypo-osmolality and hyponatremia: Secondary | ICD-10-CM

## 2019-07-09 DIAGNOSIS — W1830XA Fall on same level, unspecified, initial encounter: Secondary | ICD-10-CM | POA: Insufficient documentation

## 2019-07-09 DIAGNOSIS — Z79899 Other long term (current) drug therapy: Secondary | ICD-10-CM | POA: Insufficient documentation

## 2019-07-09 DIAGNOSIS — S0081XA Abrasion of other part of head, initial encounter: Secondary | ICD-10-CM | POA: Diagnosis not present

## 2019-07-09 DIAGNOSIS — E876 Hypokalemia: Secondary | ICD-10-CM | POA: Diagnosis not present

## 2019-07-09 DIAGNOSIS — Y998 Other external cause status: Secondary | ICD-10-CM | POA: Insufficient documentation

## 2019-07-09 DIAGNOSIS — Y9389 Activity, other specified: Secondary | ICD-10-CM | POA: Diagnosis not present

## 2019-07-09 DIAGNOSIS — S0990XA Unspecified injury of head, initial encounter: Secondary | ICD-10-CM

## 2019-07-09 DIAGNOSIS — W19XXXA Unspecified fall, initial encounter: Secondary | ICD-10-CM

## 2019-07-09 DIAGNOSIS — Y92012 Bathroom of single-family (private) house as the place of occurrence of the external cause: Secondary | ICD-10-CM | POA: Diagnosis not present

## 2019-07-09 DIAGNOSIS — Z5320 Procedure and treatment not carried out because of patient's decision for unspecified reasons: Secondary | ICD-10-CM | POA: Diagnosis not present

## 2019-07-09 LAB — URINALYSIS, ROUTINE W REFLEX MICROSCOPIC
Bacteria, UA: NONE SEEN
Bilirubin Urine: NEGATIVE
Glucose, UA: NEGATIVE mg/dL
Ketones, ur: NEGATIVE mg/dL
Leukocytes,Ua: NEGATIVE
Nitrite: NEGATIVE
Protein, ur: NEGATIVE mg/dL
Specific Gravity, Urine: 1.01 (ref 1.005–1.030)
pH: 5 (ref 5.0–8.0)

## 2019-07-09 LAB — COMPREHENSIVE METABOLIC PANEL
ALT: 37 U/L (ref 0–44)
AST: 42 U/L — ABNORMAL HIGH (ref 15–41)
Albumin: 2.4 g/dL — ABNORMAL LOW (ref 3.5–5.0)
Alkaline Phosphatase: 113 U/L (ref 38–126)
Anion gap: 11 (ref 5–15)
BUN: 32 mg/dL — ABNORMAL HIGH (ref 8–23)
CO2: 22 mmol/L (ref 22–32)
Calcium: 9.1 mg/dL (ref 8.9–10.3)
Chloride: 89 mmol/L — ABNORMAL LOW (ref 98–111)
Creatinine, Ser: 1.17 mg/dL (ref 0.61–1.24)
GFR calc Af Amer: >60 mL/min (ref >60)
GFR calc non Af Amer: 59 mL/min — ABNORMAL LOW (ref >60)
Glucose, Bld: 113 mg/dL — ABNORMAL HIGH (ref 70–99)
Potassium: 3.5 mmol/L (ref 3.5–5.1)
Sodium: 122 mmol/L — ABNORMAL LOW (ref 135–145)
Total Bilirubin: 1.1 mg/dL (ref 0.3–1.2)
Total Protein: 7.2 g/dL (ref 6.5–8.1)

## 2019-07-09 LAB — CK: Total CK: 442 U/L — ABNORMAL HIGH (ref 49–397)

## 2019-07-09 LAB — PROTIME-INR
INR: 1.4 — ABNORMAL HIGH (ref 0.8–1.2)
Prothrombin Time: 17.2 seconds — ABNORMAL HIGH (ref 11.4–15.2)

## 2019-07-09 LAB — BASIC METABOLIC PANEL
Anion gap: 12 (ref 5–15)
BUN: 28 mg/dL — ABNORMAL HIGH (ref 8–23)
CO2: 18 mmol/L — ABNORMAL LOW (ref 22–32)
Calcium: 8 mg/dL — ABNORMAL LOW (ref 8.9–10.3)
Chloride: 96 mmol/L — ABNORMAL LOW (ref 98–111)
Creatinine, Ser: 0.86 mg/dL (ref 0.61–1.24)
GFR calc Af Amer: >60 mL/min (ref >60)
GFR calc non Af Amer: >60 mL/min (ref >60)
Glucose, Bld: 94 mg/dL (ref 70–99)
Potassium: 2.7 mmol/L — CL (ref 3.5–5.1)
Sodium: 126 mmol/L — ABNORMAL LOW (ref 135–145)

## 2019-07-09 LAB — CBC WITH DIFFERENTIAL/PLATELET
Abs Immature Granulocytes: 0.1 10*3/uL — ABNORMAL HIGH (ref 0.00–0.07)
Basophils Absolute: 0 10*3/uL (ref 0.0–0.1)
Basophils Relative: 0 %
Eosinophils Absolute: 0.1 10*3/uL (ref 0.0–0.5)
Eosinophils Relative: 1 %
HCT: 33.6 % — ABNORMAL LOW (ref 39.0–52.0)
Hemoglobin: 10.6 g/dL — ABNORMAL LOW (ref 13.0–17.0)
Immature Granulocytes: 1 %
Lymphocytes Relative: 4 %
Lymphs Abs: 0.5 10*3/uL — ABNORMAL LOW (ref 0.7–4.0)
MCH: 28.9 pg (ref 26.0–34.0)
MCHC: 31.5 g/dL (ref 30.0–36.0)
MCV: 91.6 fL (ref 80.0–100.0)
Monocytes Absolute: 0.6 10*3/uL (ref 0.1–1.0)
Monocytes Relative: 5 %
Neutro Abs: 11.7 10*3/uL — ABNORMAL HIGH (ref 1.7–7.7)
Neutrophils Relative %: 89 %
Platelets: 418 10*3/uL — ABNORMAL HIGH (ref 150–400)
RBC: 3.67 MIL/uL — ABNORMAL LOW (ref 4.22–5.81)
RDW: 14.8 % (ref 11.5–15.5)
WBC: 13.1 10*3/uL — ABNORMAL HIGH (ref 4.0–10.5)
nRBC: 0 % (ref 0.0–0.2)

## 2019-07-09 LAB — CBG MONITORING, ED: Glucose-Capillary: 114 mg/dL — ABNORMAL HIGH (ref 70–99)

## 2019-07-09 LAB — APTT: aPTT: 39 seconds — ABNORMAL HIGH (ref 24–36)

## 2019-07-09 LAB — TROPONIN I (HIGH SENSITIVITY): Troponin I (High Sensitivity): 28 ng/L — ABNORMAL HIGH (ref <18)

## 2019-07-09 LAB — BRAIN NATRIURETIC PEPTIDE: B Natriuretic Peptide: 986.8 pg/mL — ABNORMAL HIGH (ref 0.0–100.0)

## 2019-07-09 MED ORDER — POTASSIUM CHLORIDE ER 10 MEQ PO TBCR: 40.0000 meq | EXTENDED_RELEASE_TABLET | Freq: Every day | ORAL | 0 refills | Status: DC

## 2019-07-09 NOTE — Discharge Instructions (Addendum)
Take the potassium pills as directed. Return to ED for worsening symptoms, additional injuries or falls, chest pain, shortness of breath.

## 2019-07-09 NOTE — ED Provider Notes (Signed)
[facility] EMERGENCY DEPARTMENT Provider Note   CSN: [id number] Arrival date & time: 07/09/19  1247     History Chief Complaint  Patient presents with   Kevin Reilly is a 79 y.o. male who presents to ED as a level 2 trauma after fall.  Patient was found by a worker in his home this morning, sitting on the bathroom floor. Patient unsure how long he had been there. He lives alone. He denies any headache, chest pain, abdominal pain, vomiting or vision changes.  According to EMS, patient is estranged from family, has basically withdrew care from his PCP, other specialist and has refused EMS transport in the past.  He has been compliant with his medications including his Eliquis.  HPI     No past medical history on file.  There are no problems to display for this patient.      No family history on file.  Social History   Tobacco Use   Smoking status: Not on file  Substance Use Topics   Alcohol use: Not on file   Drug use: Not on file    Home Medications Prior to Admission medications   Medication Sig Start Date End Date Taking? Authorizing Provider  albuterol (VENTOLIN HFA) 108 (90 Base) MCG/ACT inhaler Inhale 2 puffs into the lungs every 4 (four) hours as needed for wheezing or shortness of breath.  05/02/19  Yes [provider]  amLODipine (NORVASC) 5 MG tablet Take 5 mg by mouth daily. 06/02/19  Yes [provider]  atorvastatin (LIPITOR) 20 MG tablet Take 20 mg by mouth daily. 05/30/19  Yes [provider]  furosemide (LASIX) 20 MG tablet Take 20 mg by mouth daily. 06/02/19  Yes [provider]  KLOR-CON M20 20 MEQ tablet Take 20 mEq by mouth daily. 06/02/19  Yes [provider]  metolazone (ZAROXOLYN) 2.5 MG tablet Take 2.5 mg by mouth 3 (three) times a week. Monday, Wednesday, Friday 06/06/19  Yes [provider]  ramipril (ALTACE) 10 MG capsule Take 10 mg by mouth at bedtime. 06/23/19  Yes  [provider]  potassium chloride (KLOR-CON) 10 MEQ tablet Take 4 tablets (40 mEq total) by mouth daily for 4 days. 07/09/19 07/13/19  Dietrich Pates, PA-C    Allergies    Patient has no known allergies.  Review of Systems   Review of Systems  Constitutional: Negative for appetite change, chills and fever.  HENT: Negative for ear pain, rhinorrhea, sneezing and sore throat.   Eyes: Negative for photophobia and visual disturbance.  Respiratory: Negative for cough, chest tightness, shortness of breath and wheezing.   Cardiovascular: Negative for chest pain and palpitations.  Gastrointestinal: Negative for abdominal pain, blood in stool, constipation, diarrhea, nausea and vomiting.  Genitourinary: Negative for dysuria, hematuria and urgency.  Musculoskeletal: Negative for myalgias.  Skin: Negative for rash.  Neurological: Negative for dizziness, weakness and light-headedness.    Physical Exam Updated Vital Signs BP (!) 165/71    Pulse 92    Temp (!) 96.2 F (35.7 C) (Temporal)    Resp 17    Ht 5\' 7"  (1.702 m)    Wt 68 kg    SpO2 99%    BMI 23.49 kg/m   Physical Exam Vitals and nursing note reviewed.  Constitutional:      General: He is not in acute distress.    Appearance: He is well-developed.  HENT:     Head: Normocephalic and atraumatic.  Nose: Nose normal.  Eyes:     General: No scleral icterus.       Right eye: No discharge.        Left eye: No discharge.     Conjunctiva/sclera: Conjunctivae normal.     Pupils: Pupils are equal, round, and reactive to light.  Cardiovascular:     Rate and Rhythm: Normal rate and regular rhythm.     Heart sounds: Normal heart sounds. No murmur. No friction rub. No gallop.   Pulmonary:     Effort: Pulmonary effort is normal. No respiratory distress.     Breath sounds: Normal breath sounds.  Abdominal:     General: Bowel sounds are normal. There is no distension.     Palpations: Abdomen is soft.     Tenderness: There is no  abdominal tenderness. There is no guarding.  Musculoskeletal:        General: Normal range of motion.     Cervical back: Normal range of motion and neck supple.     Right lower leg: No edema.     Left lower leg: No edema.     Comments: No midline spinal tenderness present in lumbar, thoracic or cervical spine. No step-off palpated. No visible bruising, edema or temperature change noted. No objective signs of numbness present. No saddle anesthesia. 2+ DP pulses bilaterally. Sensation intact to light touch.   Skin:    General: Skin is warm and dry.     Findings: No rash.     Comments: Abrasion below left eyebrow.  Neurological:     General: No focal deficit present.     Mental Status: He is alert and oriented to person, place, and time.     Cranial Nerves: No cranial nerve deficit.     Sensory: No sensory deficit.     Motor: No weakness or abnormal muscle tone.     Coordination: Coordination normal.     Comments: Pupils reactive. No facial asymmetry noted. Cranial nerves appear grossly intact. Sensation intact to light touch on face, BUE and BLE. Strength 5/5 in BUE and BLE.      ED Results / Procedures / Treatments   Labs (all labs ordered are listed, but only abnormal results are displayed) Labs Reviewed  CBC WITH DIFFERENTIAL/PLATELET - Abnormal; Notable for the following components:      Result Value   WBC 13.1 (*)    RBC 3.67 (*)    Hemoglobin 10.6 (*)    HCT 33.6 (*)    Platelets 418 (*)    Neutro Abs 11.7 (*)    Lymphs Abs 0.5 (*)    Abs Immature Granulocytes 0.10 (*)    All other components within normal limits  APTT - Abnormal; Notable for the following components:   aPTT 39 (*)    All other components within normal limits  PROTIME-INR - Abnormal; Notable for the following components:   Prothrombin Time 17.2 (*)    INR 1.4 (*)    All other components within normal limits  URINALYSIS, ROUTINE W REFLEX MICROSCOPIC - Abnormal; Notable for the following components:    Hgb urine dipstick SMALL (*)    All other components within normal limits  COMPREHENSIVE METABOLIC PANEL - Abnormal; Notable for the following components:   Sodium 122 (*)    Chloride 89 (*)    Glucose, Bld 113 (*)    BUN 32 (*)    Albumin 2.4 (*)    AST 42 (*)    GFR calc non  Af Amer 59 (*)    All other components within normal limits  BASIC METABOLIC PANEL - Abnormal; Notable for the following components:   Sodium 126 (*)    Potassium 2.7 (*)    Chloride 96 (*)    CO2 18 (*)    BUN 28 (*)    Calcium 8.0 (*)    All other components within normal limits  BRAIN NATRIURETIC PEPTIDE - Abnormal; Notable for the following components:   B Natriuretic Peptide 986.8 (*)    All other components within normal limits  CK - Abnormal; Notable for the following components:   Total CK 442 (*)    All other components within normal limits  CBG MONITORING, ED - Abnormal; Notable for the following components:   Glucose-Capillary 114 (*)    All other components within normal limits  TROPONIN I (HIGH SENSITIVITY) - Abnormal; Notable for the following components:   Troponin I (High Sensitivity) 28 (*)    All other components within normal limits    EKG EKG Interpretation  Date/Time:  Monday July 09 2019 12:58:24 EDT Ventricular Rate:  90 PR Interval:    QRS Duration: 124 QT Interval:  430 QTC Calculation: 527 R Axis:   72 Text Interpretation: Sinus rhythm Atrial premature complex Probable left atrial enlargement LVH with secondary repolarization abnormality Prolonged QT interval No previous tracing Confirmed by Blanchie Dessert [phone]) on 07/09/2019 1:24:23 PM   Radiology CT Head Wo Contrast  Result Date: 07/09/2019 CLINICAL DATA:  Patient found down. Denies hitting his head. EXAM: CT HEAD WITHOUT CONTRAST CT CERVICAL SPINE WITHOUT CONTRAST TECHNIQUE: Multidetector CT imaging of the head and cervical spine was performed following the standard protocol without intravenous contrast.  Multiplanar CT image reconstructions of the cervical spine were also generated. COMPARISON:  None. FINDINGS: CT HEAD FINDINGS Brain: Ventricles and sulci are appropriate for patient's age. No evidence for acute cortically based infarct, intracranial hemorrhage, mass lesion or mass-effect. Chronic right cerebellar hemisphere infarct. Vascular: Unremarkable Skull: Intact. Sinuses/Orbits: Paranasal sinuses are well aerated. Mastoid air cells are unremarkable. Other: None. CT CERVICAL SPINE FINDINGS Alignment: Normal anatomic alignment. Skull base and vertebrae: Intact. Soft tissues and spinal canal: No prevertebral fluid or swelling. No visible canal hematoma. Disc levels: Multilevel degenerative disc disease most pronounced C4-5, C5-6 and C6-7. Multilevel facet degenerative changes. No evidence for acute fracture. Upper chest: Emphysematous changes. Biapical scarring. Other: None. IMPRESSION: 1. No acute intracranial process. 2. No acute cervical spine fracture. Degenerative disc disease. Electronically Signed   By: Lovey Newcomer M.D.   On: 07/09/2019 13:41   CT Cervical Spine Wo Contrast  Result Date: 07/09/2019 CLINICAL DATA:  Patient found down. Denies hitting his head. EXAM: CT HEAD WITHOUT CONTRAST CT CERVICAL SPINE WITHOUT CONTRAST TECHNIQUE: Multidetector CT imaging of the head and cervical spine was performed following the standard protocol without intravenous contrast. Multiplanar CT image reconstructions of the cervical spine were also generated. COMPARISON:  None. FINDINGS: CT HEAD FINDINGS Brain: Ventricles and sulci are appropriate for patient's age. No evidence for acute cortically based infarct, intracranial hemorrhage, mass lesion or mass-effect. Chronic right cerebellar hemisphere infarct. Vascular: Unremarkable Skull: Intact. Sinuses/Orbits: Paranasal sinuses are well aerated. Mastoid air cells are unremarkable. Other: None. CT CERVICAL SPINE FINDINGS Alignment: Normal anatomic alignment. Skull  base and vertebrae: Intact. Soft tissues and spinal canal: No prevertebral fluid or swelling. No visible canal hematoma. Disc levels: Multilevel degenerative disc disease most pronounced C4-5, C5-6 and C6-7. Multilevel facet degenerative changes. No evidence for  acute fracture. Upper chest: Emphysematous changes. Biapical scarring. Other: None. IMPRESSION: 1. No acute intracranial process. 2. No acute cervical spine fracture. Degenerative disc disease. Electronically Signed   By: Annia Belt M.D.   On: 07/09/2019 13:41   DG Chest Portable 1 View  Result Date: 07/09/2019 CLINICAL DATA:  Patient found down today. Elevated white blood cell count. EXAM: PORTABLE CHEST 1 VIEW COMPARISON:  PA and lateral chest 06/07/2019 and 05/27/2019. Single-view of the chest 05/16/2019. FINDINGS: Bilateral airspace disease seen on the prior examinations persist appears mildly improved. No pneumothorax or pleural effusion. Heart size is upper normal. Atherosclerosis noted. No acute or focal bony abnormality. IMPRESSION: Bilateral airspace disease compatible with pneumonia seen on the prior examinations persist but appears somewhat improved. Atherosclerosis. Electronically Signed   By: Drusilla Kanner M.D.   On: 07/09/2019 14:12    Procedures Procedures (including critical care time)  Medications Ordered in ED Medications - No data to display  ED Course  I have reviewed the triage vital signs and the nursing notes.  Pertinent labs & imaging results that were available during my care of the patient were reviewed by me and considered in my medical decision making (see chart for details).  Clinical Course as of Jul 09 1643  Mon Jul 09, 2019  1446 Sodium(!): 122 [HK]  1451 Chart review shows that patient sodium level was 134 approximately 2 weeks ago.   [HK]  1631 Potassium(!!): 2.7 [HK]  1632 CK Total(!): 442 [HK]    Clinical Course User Index [HK] Dietrich Pates, PA-C   MDM Rules/Calculators/A&P                       79 year old male presents to ED as a level 2 trauma after fall on anticoagulant medication.  Found by a worker in his home this morning, sitting or laying on the bathroom floor.  Patient unsure how long he had been there, told Dr. Anitra Lauth that he may have fallen at some point last night.  Patient not complaining of any pain on my examination.  Denies any headache, neck pain, back pain, numbness in arms or legs.  On my exam he is alert and oriented x4.  He is moving his extremities without difficulty.  No weakness or change sensation noted on exam.  No facial asymmetry noted.  Although his chart has not merged during my evaluation, was able to see prior visits and medical history for this patient.  He has history of CHF does not appear fluid overloaded on exam.  CT of the head and neck are negative for acute abnormality.  Chest x-ray shows findings consistent with possible pneumonia but does appear improved from last month.  EKG showing prolonged QT, some LVH but unsure if this is his baseline as there are no prior EKGs in the system.  Lab work significant for leukocytosis of 13, hyponatremia of 122 which appears decreased from 134 done 2 weeks ago.  I did obtain some repeat lab work to recheck this hyponatremia.  Recheck lab work shows potassium of 2.7 and persistent hyponatremia at 126.  BNP elevated to the 900s but he does not appear fluid overloaded.  CK slightly elevated to 442.  Troponin slightly elevated at 28.  Discussed admission with patient due to his electrolyte derangements, recent fall that could be secondary to weakness in relation to this.  I urged patient that admission would be beneficial to him given his age and comorbidities.  Patient declines admission  at this time and would like to leave AMA.  I have discussed my concerns as a provider and the possibility that this may worsen. We discussed the nature, risks and benefits, and alternatives to treatment. I have specifically discussed that  without further evaluation I cannot guarantee there is not a life threatening event occuring.  Time was given to allow the opportunity to ask questions and consider the options, and after the discussion, the patient decided to refuse the offered treatment. Patient is alert and oriented x4, their own POA and states understanding of my concerns and the possible consequences. After refusal, I made every reasonable opportunity to treat them to the best of my ability. I have made the patient aware that this is an AMA discharge, but he may return at any time for further evaluation and treatment.  I will give him a prescription for potassium pills to take at home.  Final Clinical Impression(s) / ED Diagnoses Final diagnoses:  Fall in home, initial encounter  Injury of head, initial encounter  Hyponatremia  Hypokalemia    Rx / DC Orders ED Discharge Orders         Ordered    potassium chloride (KLOR-CON) 10 MEQ tablet  Daily     07/09/19 1638          Portions of this note were generated with Dragon dictation software. Dictation errors may occur despite best attempts at proofreading.    Dietrich Pates, PA-C 07/09/19 1645    Gwyneth Sprout, MD 07/11/19 [phone]

## 2019-07-09 NOTE — Progress Notes (Signed)
Orthopedic Tech Progress Note Patient Details:  Kevin Reilly [birth year] [id number] Level 2 trauma Patient ID: Kevin Reilly, male   DOB: [birth year], 79 y.o.   MRN: [id number]   [name] 07/09/2019, 1:28 PM

## 2019-07-09 NOTE — ED Triage Notes (Signed)
Pt arrives via EMS from home where someone found him in bathroom on floor. Pt denies hitting his head. Recently diagnosed with PNA. Pt due for heart cath this past month but refused per EMS.

## 2019-07-09 NOTE — ED Notes (Signed)
Pt given prescription for potassium. Encouraged to return should symptoms worsen.

## 2019-07-09 NOTE — ED Notes (Signed)
Pt transported to CT ?

## 2019-08-03 ENCOUNTER — Encounter (HOSPITAL_COMMUNITY): Payer: Self-pay | Admitting: Emergency Medicine

## 2019-08-03 ENCOUNTER — Emergency Department (HOSPITAL_COMMUNITY): Payer: Medicare Other

## 2019-08-03 ENCOUNTER — Inpatient Hospital Stay (HOSPITAL_COMMUNITY)
Admission: EM | Admit: 2019-08-03 | Discharge: 2019-08-08 | DRG: 193 | Disposition: A | Discharge status: Skilled Nursing Facility | Payer: Medicare Other | Attending: Internal Medicine | Admitting: Internal Medicine

## 2019-08-03 ENCOUNTER — Other Ambulatory Visit: Payer: Self-pay

## 2019-08-03 DIAGNOSIS — Z79899 Other long term (current) drug therapy: Secondary | ICD-10-CM

## 2019-08-03 DIAGNOSIS — N179 Acute kidney failure, unspecified: Secondary | ICD-10-CM | POA: Diagnosis present

## 2019-08-03 DIAGNOSIS — R4182 Altered mental status, unspecified: Secondary | ICD-10-CM

## 2019-08-03 DIAGNOSIS — J188 Other pneumonia, unspecified organism: Principal | ICD-10-CM | POA: Diagnosis present

## 2019-08-03 DIAGNOSIS — E860 Dehydration: Secondary | ICD-10-CM | POA: Diagnosis present

## 2019-08-03 DIAGNOSIS — Z638 Other specified problems related to primary support group: Secondary | ICD-10-CM

## 2019-08-03 DIAGNOSIS — F329 Major depressive disorder, single episode, unspecified: Secondary | ICD-10-CM | POA: Diagnosis present

## 2019-08-03 DIAGNOSIS — Z7982 Long term (current) use of aspirin: Secondary | ICD-10-CM

## 2019-08-03 DIAGNOSIS — R339 Retention of urine, unspecified: Secondary | ICD-10-CM | POA: Diagnosis present

## 2019-08-03 DIAGNOSIS — R1312 Dysphagia, oropharyngeal phase: Secondary | ICD-10-CM | POA: Diagnosis not present

## 2019-08-03 DIAGNOSIS — D649 Anemia, unspecified: Secondary | ICD-10-CM | POA: Diagnosis present

## 2019-08-03 DIAGNOSIS — R627 Adult failure to thrive: Secondary | ICD-10-CM | POA: Diagnosis present

## 2019-08-03 DIAGNOSIS — Z515 Encounter for palliative care: Secondary | ICD-10-CM

## 2019-08-03 DIAGNOSIS — I255 Ischemic cardiomyopathy: Secondary | ICD-10-CM | POA: Diagnosis present

## 2019-08-03 DIAGNOSIS — Z726 Gambling and betting: Secondary | ICD-10-CM

## 2019-08-03 DIAGNOSIS — J982 Interstitial emphysema: Secondary | ICD-10-CM | POA: Diagnosis not present

## 2019-08-03 DIAGNOSIS — E876 Hypokalemia: Secondary | ICD-10-CM | POA: Diagnosis present

## 2019-08-03 DIAGNOSIS — J9601 Acute respiratory failure with hypoxia: Secondary | ICD-10-CM

## 2019-08-03 DIAGNOSIS — Z86711 Personal history of pulmonary embolism: Secondary | ICD-10-CM

## 2019-08-03 DIAGNOSIS — E785 Hyperlipidemia, unspecified: Secondary | ICD-10-CM | POA: Diagnosis present

## 2019-08-03 DIAGNOSIS — E861 Hypovolemia: Secondary | ICD-10-CM | POA: Diagnosis present

## 2019-08-03 DIAGNOSIS — I2694 Multiple subsegmental pulmonary emboli without acute cor pulmonale: Secondary | ICD-10-CM | POA: Diagnosis present

## 2019-08-03 DIAGNOSIS — I420 Dilated cardiomyopathy: Secondary | ICD-10-CM | POA: Diagnosis present

## 2019-08-03 DIAGNOSIS — T797XXA Traumatic subcutaneous emphysema, initial encounter: Secondary | ICD-10-CM | POA: Diagnosis present

## 2019-08-03 DIAGNOSIS — R911 Solitary pulmonary nodule: Secondary | ICD-10-CM | POA: Diagnosis present

## 2019-08-03 DIAGNOSIS — I2699 Other pulmonary embolism without acute cor pulmonale: Secondary | ICD-10-CM

## 2019-08-03 DIAGNOSIS — Z882 Allergy status to sulfonamides status: Secondary | ICD-10-CM

## 2019-08-03 DIAGNOSIS — Z86718 Personal history of other venous thrombosis and embolism: Secondary | ICD-10-CM

## 2019-08-03 DIAGNOSIS — G9341 Metabolic encephalopathy: Secondary | ICD-10-CM

## 2019-08-03 DIAGNOSIS — J984 Other disorders of lung: Secondary | ICD-10-CM

## 2019-08-03 DIAGNOSIS — Z20822 Contact with and (suspected) exposure to covid-19: Secondary | ICD-10-CM | POA: Diagnosis present

## 2019-08-03 DIAGNOSIS — I5042 Chronic combined systolic (congestive) and diastolic (congestive) heart failure: Secondary | ICD-10-CM | POA: Diagnosis present

## 2019-08-03 DIAGNOSIS — Z91018 Allergy to other foods: Secondary | ICD-10-CM

## 2019-08-03 DIAGNOSIS — E43 Unspecified severe protein-calorie malnutrition: Secondary | ICD-10-CM | POA: Diagnosis present

## 2019-08-03 DIAGNOSIS — Z9114 Patient's other noncompliance with medication regimen: Secondary | ICD-10-CM

## 2019-08-03 DIAGNOSIS — E872 Acidosis: Secondary | ICD-10-CM | POA: Diagnosis not present

## 2019-08-03 DIAGNOSIS — Z7901 Long term (current) use of anticoagulants: Secondary | ICD-10-CM

## 2019-08-03 DIAGNOSIS — I739 Peripheral vascular disease, unspecified: Secondary | ICD-10-CM | POA: Diagnosis present

## 2019-08-03 DIAGNOSIS — Z7189 Other specified counseling: Secondary | ICD-10-CM

## 2019-08-03 DIAGNOSIS — Z66 Do not resuscitate: Secondary | ICD-10-CM

## 2019-08-03 DIAGNOSIS — Z87891 Personal history of nicotine dependence: Secondary | ICD-10-CM

## 2019-08-03 DIAGNOSIS — Z681 Body mass index (BMI) 19 or less, adult: Secondary | ICD-10-CM

## 2019-08-03 DIAGNOSIS — I110 Hypertensive heart disease with heart failure: Secondary | ICD-10-CM | POA: Diagnosis present

## 2019-08-03 LAB — POCT I-STAT 7, (LYTES, BLD GAS, ICA,H+H)
Acid-base deficit: 2 mmol/L (ref 0.0–2.0)
Bicarbonate: 22.4 mmol/L (ref 20.0–28.0)
Calcium, Ion: 1.34 mmol/L (ref 1.15–1.40)
HCT: 35 % — ABNORMAL LOW (ref 39.0–52.0)
Hemoglobin: 11.9 g/dL — ABNORMAL LOW (ref 13.0–17.0)
O2 Saturation: 100 %
Patient temperature: 97.8
Potassium: 4.2 mmol/L (ref 3.5–5.1)
Sodium: 141 mmol/L (ref 135–145)
TCO2: 23 mmol/L (ref 22–32)
pCO2 arterial: 35.9 mmHg (ref 32.0–48.0)
pH, Arterial: 7.401 (ref 7.350–7.450)
pO2, Arterial: 176 mmHg — ABNORMAL HIGH (ref 83.0–108.0)

## 2019-08-03 LAB — PROTIME-INR
INR: 1.9 — ABNORMAL HIGH (ref 0.8–1.2)
Prothrombin Time: 21.4 seconds — ABNORMAL HIGH (ref 11.4–15.2)

## 2019-08-03 LAB — APTT: aPTT: 57 seconds — ABNORMAL HIGH (ref 24–36)

## 2019-08-03 MED ORDER — SODIUM CHLORIDE 0.9 % IV BOLUS (SEPSIS)
500.0000 mL | Freq: Once | INTRAVENOUS | Status: AC
  Administered 2019-08-03: 500 mL via INTRAVENOUS

## 2019-08-03 NOTE — ED Triage Notes (Signed)
Pt BIB [facility] from home, pt lives alone and has a caregiver that checks on him. Caregiver found him lying on the floor and acting abnormally, pt slow to respond. HR 120, BP 104/70.

## 2019-08-03 NOTE — ED Provider Notes (Signed)
[facility] EMERGENCY DEPARTMENT Provider Note   CSN: [id number] Arrival date & time: 08/03/19  2206     History Chief Complaint  Patient presents with  . Altered Mental Status    Kevin Reilly is a 79 y.o. male with a hx of hypertension, peripheral arterial disease, dilated cardiomyopathy and chronic systolic and diastolic heart failure, long-term anticoagulation on Eliquis presents to the Emergency Department via EMS.  Per EMS, patient lives alone and has a caregiver who checks on him daily.  When the caregiver arrived this evening patient was found on the floor and altered.  Unknown how long patient had been there.  Patient alert to name only.  He does report he fell today but when asked when he fell he simply states "3 years ago."  Reports he has been feeling badly for "3 years."    Level 5 caveat secondary to altered mental status.  Records reviewed: Patient seen here in the emergency department on 07/09/2019 for fall.  He was found to be severely hyponatremic, hypokalemic and fluid overloaded.  Admission was recommended however patient left AMA.  Per the record, patient lives alone and has a caregiver  Echo 06/08/2019: Severely depressed LV systolic function with visual EF <20%. Left ventricle cavity is visually dilated in size. Dilated cardiomyopathy. Hypokinetic global wall motion. Doppler evidence of grade II diastolic dysfunction, elevated LAP. Mild left ventricular hypertrophy. Calculated EF 22%.   Cardiac Cath: none  The history is provided by the patient, medical records and the EMS personnel. The history is limited by the condition of the patient. No language interpreter was used.       Past Medical History:  Diagnosis Date  . Bilateral carotid artery disease (HCC)   . Cardiomyopathy (HCC) 05/2019  . CHF (congestive heart failure) (HCC)   . Critical lower limb ischemia   . DVT (deep venous thrombosis) (HCC)   . Dyspnea on exertion 05/30/2019  .  Hyperlipidemia    takes Atorvastatin daily  . Hypertension    takes Amlodipine and Ramipril daily  . Joint swelling   . Nocturia   . Peripheral arterial disease (HCC)   . Pulmonary embolism (HCC)    Refer to VQ scan.     Patient Active Problem List   Diagnosis Date Noted  . Pneumomediastinum (HCC) 08/04/2019  . Dilated cardiomyopathy (HCC) 06/11/2019  . Chronic combined systolic and diastolic heart failure (HCC) 06/11/2019  . Mixed hyperlipidemia 05/30/2019  . Dyspnea on exertion 05/30/2019  . Acute combined systolic and diastolic heart failure (HCC) 05/19/2019  . PAD (peripheral artery disease) (HCC) 02/25/2015  . Bilateral carotid artery disease (HCC) 02/05/2015  . Essential hypertension 01/01/2014  . Critical lower limb ischemia 01/01/2014  . Bilateral lower extremity edema 01/01/2014    Past Surgical History:  Procedure Laterality Date  . BACK SURGERY    . colonosocpy    . ENDARTERECTOMY FEMORAL Right 02/25/2015   Procedure: ENDARTERECTOMYRight  FEMORAL Endarterectomy with profundaplasty.;  Surgeon: Sherren Kerns, MD;  Location: [facility] OR;  Service: Vascular;  Laterality: Right;  . FEMORAL-POPLITEAL BYPASS GRAFT Right 02/25/2015   Procedure: BYPASS GRAFT FEMORAL-POPLITEAL ARTERY using non-reversed saphenous vein.;  Surgeon: Sherren Kerns, MD;  Location: [facility] OR;  Service: Vascular;  Laterality: Right;  . PERIPHERAL VASCULAR CATHETERIZATION N/A 01/24/2015   Procedure: Abdominal Aortogram;  Surgeon: Sherren Kerns, MD;  Location: [facility] INVASIVE CV LAB;  Service: Cardiovascular;  Laterality: N/A;       No family history on  file.  Social History   Tobacco Use  . Smoking status: Former Smoker    Types: Cigarettes  . Smokeless tobacco: Never Used  Substance Use Topics  . Alcohol use: No    Alcohol/week: 0.0 standard drinks  . Drug use: No    Home Medications Prior to Admission medications   Medication Sig Start Date End Date Taking? Authorizing Provider    acetaminophen (TYLENOL) 500 MG tablet Take 1,500 mg by mouth 2 (two) times daily as needed for headache.     [provider]  albuterol (VENTOLIN HFA) 108 (90 Base) MCG/ACT inhaler Inhale 2 puffs into the lungs every 4 (four) hours as needed for wheezing or shortness of breath.  05/02/19   [provider]  amLODipine (NORVASC) 5 MG tablet Take 5 mg by mouth daily. 06/02/19   [provider]  Apixaban Starter Pack (ELIQUIS STARTER PACK) 5 MG TBPK Take as directed on package: start with two-5mg  tablets twice daily for 7 days. On day 8, switch to one-5mg  tablet twice daily. 06/08/19   Tolia, Sunit, DO  aspirin 81 MG chewable tablet Chew 81 mg by mouth daily.     [provider]  atorvastatin (LIPITOR) 20 MG tablet Take 1 tablet (20 mg total) by mouth daily. 05/30/19   Tolia, Sunit, DO  furosemide (LASIX) 20 MG tablet Take 1 tablet (20 mg total) by mouth daily. 05/30/19   Tolia, Sunit, DO  metolazone (ZAROXOLYN) 2.5 MG tablet TAKE 1 TABLET BY MOUTH EVERY MONDAY, WEDNESDAY, AND FRIDAY. Patient taking differently: Take 2.5 mg by mouth every Monday, Wednesday, and Friday.  06/28/19   Tolia, Sunit, DO  potassium chloride (KLOR-CON) 10 MEQ tablet Take 4 tablets (40 mEq total) by mouth daily for 4 days. Patient not taking: Reported on 08/03/2019 07/09/19 07/13/19  Dietrich Pates, PA-C  potassium chloride SA (KLOR-CON) 20 MEQ tablet Take 1 tablet (20 mEq total) by mouth daily. Patient may request a liquid formulation. 05/30/19   Tolia, Sunit, DO  ramipril (ALTACE) 10 MG capsule TAKE 1 CAPSULE BY MOUTH AT BEDTIME Patient taking differently: Take 10 mg by mouth at bedtime.  06/22/19   Tolia, Sunit, DO    Allergies    Chocolate and Sulfur  Review of Systems   Review of Systems  Unable to perform ROS: Mental status change    Physical Exam Updated Vital Signs BP 102/61   Temp 97.8 F (36.6 C) (Rectal)   Resp (!) 21   Physical Exam Vitals and nursing note reviewed.   Constitutional:      General: He is not in acute distress.    Appearance: He is ill-appearing. He is not diaphoretic.     Comments: Cachectic  HENT:     Head: Normocephalic and atraumatic.     Nose: Nose normal.     Mouth/Throat:     Mouth: Mucous membranes are dry.     Comments: Very dry mucous membranes Eyes:     General: No scleral icterus.    Conjunctiva/sclera: Conjunctivae normal.     Pupils: Pupils are equal, round, and reactive to light.  Cardiovascular:     Rate and Rhythm: Regular rhythm. Tachycardia present.     Pulses:          Radial pulses are 1+ on the right side and 1+ on the left side.     Comments: Thready radial pulses Lower extremities with significant peripheral vascular disease Upper and lower extremities with cyanosis Pulmonary:     Effort: Tachypnea  present. No accessory muscle usage, prolonged expiration, respiratory distress or retractions.     Breath sounds: No stridor. Decreased breath sounds present.     Comments: Equal chest rise. Mild increased work of breathing.  Decreased breath sounds throughout. Chest:     Chest wall: No tenderness.     Comments: No tenderness, crepitus or ecchymosis. Abdominal:     General: There is no distension.     Palpations: Abdomen is soft.     Tenderness: There is no abdominal tenderness. There is no guarding or rebound.  Musculoskeletal:     Cervical back: Normal range of motion. No rigidity or tenderness.     Comments: Moves all extremities equally and without difficulty.  Skin:    General: Skin is warm and dry.     Capillary Refill: Capillary refill takes less than 2 seconds.     Comments: Erythematous site consistent with stage I pressure sore over the right iliac crest, right thigh and buttocks. Mild crepitus under the left clavicle; no palpable deformity to the clavicle or shoulder.  Neurological:     Mental Status: He is alert.     GCS: GCS eye subscore is 4. GCS verbal subscore is 5. GCS motor subscore is  6.     Comments: Speech is clear and goal oriented.  Psychiatric:        Mood and Affect: Mood normal.     ED Results / Procedures / Treatments   Labs (all labs ordered are listed, but only abnormal results are displayed) Labs Reviewed  COMPREHENSIVE METABOLIC PANEL - Abnormal; Notable for the following components:      Result Value   Potassium 2.9 (*)    Chloride 124 (*)    CO2 13 (*)    BUN 65 (*)    Creatinine, Ser 1.37 (*)    Calcium 5.7 (*)    Total Protein 4.5 (*)    Albumin 1.1 (*)    GFR calc non Af Amer 49 (*)    GFR calc Af Amer 57 (*)    All other components within normal limits  CBC WITH DIFFERENTIAL/PLATELET - Abnormal; Notable for the following components:   WBC 14.0 (*)    RBC 3.02 (*)    Hemoglobin 8.5 (*)    HCT 29.2 (*)    MCHC 29.1 (*)    RDW 15.9 (*)    Platelets 429 (*)    Neutro Abs 12.8 (*)    Lymphs Abs 0.6 (*)    Abs Immature Granulocytes 0.13 (*)    All other components within normal limits  APTT - Abnormal; Notable for the following components:   aPTT 57 (*)    All other components within normal limits  PROTIME-INR - Abnormal; Notable for the following components:   Prothrombin Time 21.4 (*)    INR 1.9 (*)    All other components within normal limits  AMMONIA - Abnormal; Notable for the following components:   Ammonia 37 (*)    All other components within normal limits  POCT I-STAT 7, (LYTES, BLD GAS, ICA,H+H) - Abnormal; Notable for the following components:   pO2, Arterial 176 (*)    HCT 35.0 (*)    Hemoglobin 11.9 (*)    All other components within normal limits  TROPONIN I (HIGH SENSITIVITY) - Abnormal; Notable for the following components:   Troponin I (High Sensitivity) 21 (*)    All other components within normal limits  RESPIRATORY PANEL BY RT PCR (FLU A&B, COVID)  CULTURE, BLOOD (ROUTINE X 2)  CULTURE, BLOOD (ROUTINE X 2)  URINE CULTURE  LACTIC ACID, PLASMA  BRAIN NATRIURETIC PEPTIDE  CK  MAGNESIUM  LACTIC ACID, PLASMA    URINALYSIS, ROUTINE W REFLEX MICROSCOPIC  HEPARIN LEVEL (UNFRACTIONATED)  I-STAT ARTERIAL BLOOD GAS, ED  TYPE AND SCREEN  TROPONIN I (HIGH SENSITIVITY)    EKG EKG Interpretation  Date/Time:  Friday August 03 2019 22:45:00 EDT Ventricular Rate:  94 PR Interval:    QRS Duration: 111 QT Interval:  322 QTC Calculation: 403 R Axis:   75 Text Interpretation: Sinus rhythm Probable left atrial enlargement LVH with secondary repolarization abnormality No significant change since last tracing Confirmed by Melene Plan [phone]) on 08/03/2019 10:46:34 PM   Radiology CT Head Wo Contrast  Result Date: 08/04/2019 CLINICAL DATA:  Lying on the floor. EXAM: CT HEAD WITHOUT CONTRAST TECHNIQUE: Contiguous axial images were obtained from the base of the skull through the vertex without intravenous contrast. COMPARISON:  None. FINDINGS: Brain: There is mild cerebral atrophy with widening of the extra-axial spaces and ventricular dilatation. There are areas of decreased attenuation within the white matter tracts of the supratentorial brain, consistent with microvascular disease changes. Vascular: No hyperdense vessel or unexpected calcification. Skull: Normal. Negative for fracture or focal lesion. Sinuses/Orbits: No acute finding. Other: None. IMPRESSION: 1. Generalized cerebral atrophy. 2. No acute intracranial abnormality. Electronically Signed   By: Aram Candela M.D.   On: 08/04/2019 02:11   CT Chest W Contrast  Result Date: 08/04/2019 CLINICAL DATA:  Found on the floor. EXAM: CT CHEST WITH CONTRAST TECHNIQUE: Multidetector CT imaging of the chest was performed during intravenous contrast administration. CONTRAST:  75mL OMNIPAQUE IOHEXOL 300 MG/ML  SOLN COMPARISON:  None. FINDINGS: Cardiovascular: A moderate amount of intraluminal low attenuation is seen involving multiple lower lobe branches of the right pulmonary artery (axial CT images 92 through 99, CT series number 4). A mild amount of intraluminal  low attenuation is also seen involving several upper lobe branches of the left pulmonary artery (axial CT images 82 through 86, CT series number 4). There is marked severity calcification of the thoracic aorta without evidence of aneurysmal dilatation or dissection. Normal heart size. No pericardial effusion. Marked severity coronary artery calcification is seen. Mediastinum/Nodes: A large amount of air is seen throughout the mediastinum. This is seen at the level of the lung bases and extends superiorly to involve the soft tissues posterior and lateral to the oropharynx. Lungs/Pleura: There is moderate to marked severity emphysematous lung disease. A 9.2 cm x 4.4 cm x 4.2 cm lobulated, thick walled cavitary lesion is seen along the posterolateral aspect of the left upper lobe. A 2.9 cm x 1.1 cm x 1.4 cm thin walled cavitary lesion is seen along the posterolateral aspect of the right middle lobe. A 9 mm noncalcified lung nodule is seen within the posterior aspect of the right lower lobe (axial CT image 93, CT series number 5). Upper Abdomen: A small amount of air density is seen posterior to the infrarenal abdominal aorta (axial CT images 153 through 157, CT series number 4), likely consistent with extension of the previously noted pneumomediastinum. Musculoskeletal: Multilevel degenerative changes seen throughout the thoracic spine. IMPRESSION: 1. Large amount of pneumomediastinum which extends superiorly to involve the soft tissues posterior and lateral to the oropharynx. 2. Mild to moderate severity bilateral pulmonary embolism, right greater than left. 3. 9.2 cm x 4.4 cm x 4.2 cm lobulated, thick walled left upper lobe cavitary lesion. This  may represent a large cavitary pneumonia, however, a cavitary neoplasm cannot be excluded. 4. Additional smaller right middle lobe cavitary lesion with a 9 mm noncalcified right lower lobe lung nodule. Attention on follow-up is recommended. 5. Moderate to marked severity  emphysematous lung disease. Aortic Atherosclerosis (ICD10-I70.0) and Emphysema (ICD10-J43.9). Electronically Signed   By: Aram Candela M.D.   On: 08/04/2019 02:40   CT Cervical Spine Wo Contrast  Result Date: 08/04/2019 CLINICAL DATA:  Found on the floor. EXAM: CT CERVICAL SPINE WITHOUT CONTRAST TECHNIQUE: Multidetector CT imaging of the cervical spine was performed without intravenous contrast. Multiplanar CT image reconstructions were also generated. COMPARISON:  July 09, 2019 FINDINGS: Alignment: There is approximately 2 mm anterolisthesis of the C4 on C5 vertebral body, with approximately 3 mm anterolisthesis of the C5 on C6 vertebral body. 3 mm anterolisthesis of C7 on T1 is also noted. Skull base and vertebrae: No acute fracture. No primary bone lesion or focal pathologic process. Soft tissues and spinal canal: No prevertebral fluid or swelling. No visible canal hematoma. Disc levels: Moderate severity endplate sclerosis is seen at the levels of C4-C5, C5-C6 and C6-C7. Moderate to marked severity intervertebral disc space narrowing is also seen at these levels. Marked severity posterior intervertebral disc space narrowing is seen at the level of C3-C4. There is marked severity bilateral multilevel facet joint hypertrophy. Upper chest: A marked amount of air is seen within the visualized portion of the superior mediastinum. This extends superiorly to involve the soft tissues along the posterior and posterolateral aspect of the a moderate amount of soft tissue air is also seen along the supraclavicular region on the left. Oropharynx. Other: None. IMPRESSION: 1. Marked severity superior pneumomediastinum which extends superiorly to involve the soft tissues along the posterior and posterolateral aspect of the left supraclavicular region on the left. Further evaluation with a chest CT is recommended. 2. Marked severity multilevel degenerative changes, most prominent at the levels of C4-C5, C5-C6 and  C6-C7. 3. 2 mm anterolisthesis of the C4 on C5 vertebral body, C5-C6 vertebral body, and C7-T1 vertebral bodies. Electronically Signed   By: Aram Candela M.D.   On: 08/04/2019 02:19   DG Chest Port 1 View  Result Date: 08/03/2019 CLINICAL DATA:  Altered mental status. EXAM: PORTABLE CHEST 1 VIEW COMPARISON:  Chest radiograph 07/09/2019. No prior chest CT. FINDINGS: Persistent airspace disease in the periphery of the left mid lung, now with air-filled component concerning for cavitation. Patchy opacities in the right lung have improved from prior exam. Normal heart size with unchanged mediastinal contours. Short lucency outlining the aortic arch was not seen on prior. Vertically oriented lucency projecting over the central left lung base. There is air in the left supraclavicular soft tissues. Possible tiny left pneumothorax. No significant pleural fluid. Underlying emphysema. No acute rib fractures are visualized. IMPRESSION: 1. Persistent airspace disease in the periphery of the left mid lung, now with air-filled component concerning for cavitation. Bilateral patchy lung opacities have otherwise improved from radiograph last month. 2. Multiple lucencies in the left hemithorax are subtle but suspicious for pneumothorax, including adjacent to the aortic arch, medial left lung base, and possibly left apex. 3. Soft tissue air in the left supraclavicular soft tissues. 4. Recommend further characterization with chest CT. These results were called by telephone at the time of interpretation on 08/03/2019 at 10:46 pm to PA [facility] , who verbally acknowledged these results. Electronically Signed   By: Narda Rutherford M.D.   On: 08/03/2019 22:47  Procedures .Critical Care Performed by: Abigail Butts, PA-C Authorized by: Abigail Butts, PA-C   Critical care provider statement:    Critical care time (minutes):  65   Critical care time was exclusive of:  Separately billable procedures  and treating other patients and teaching time   Critical care was necessary to treat or prevent imminent or life-threatening deterioration of the following conditions:  Circulatory failure and respiratory failure   Critical care was time spent personally by me on the following activities:  Discussions with consultants, evaluation of patient's response to treatment, examination of patient, ordering and performing treatments and interventions, ordering and review of laboratory studies, ordering and review of radiographic studies, pulse oximetry, re-evaluation of patient's condition, obtaining history from patient or surrogate and review of old charts   I assumed direction of critical care for this patient from another provider in my specialty: no     (including critical care time)  Medications Ordered in ED Medications  sodium chloride 0.9 % bolus 500 mL (0 mLs Intravenous Stopped 08/03/19 2352)  calcium carbonate (TUMS - dosed in mg elemental calcium) chewable tablet 400 mg of elemental calcium (400 mg of elemental calcium Oral Given 08/04/19 0416)  potassium chloride 10 mEq in 100 mL IVPB (0 mEq Intravenous Stopped 08/04/19 0523)  iohexol (OMNIPAQUE) 300 MG/ML solution 75 mL (75 mLs Intravenous Contrast Given 08/04/19 0153)    ED Course  I have reviewed the triage vital signs and the nursing notes.  Pertinent labs & imaging results that were available during my care of the patient were reviewed by me and considered in my medical decision making (see chart for details).  Clinical Course as of Aug 03 616  Fri Aug 03, 2019  2248 Chest x-ray with small pneumothorax, subcu air and cavitary lesion.  I personally evaluated these images and discussed it with the radiologist.  No obvious rib fractures.  Obtain chest CT.  DG Chest Port 1 View [HM]  Sat Aug 04, 2019  0424 Discussed with E-link, Dr. Lucile Shutters, given patient's altered mental status, pneumomediastinum and other lab abnormalities.  They do not  believe that the patient needs ICU care and are comfortable with a stepdown bed.  Will admit to the hospitalist.   [HM]  0451 Discussed with Dr. Marlowe Sax who would like PCCM consult.  I have requested that they discuss the patient to determine the best location for patient's admission.   [HM]    Clinical Course User Index [HM] Patria Warzecha, Gwenlyn Perking   MDM Rules/Calculators/A&P                       Pt arrives critically ill with AMS after being found down.  Pt alert to person only and answers some questions, follows 1 step commands without difficulty.    CXR with small pneumothorax, subcu air and cavitary lesion.  I personally evaluated the images and discussed with radiology.    The patient was discussed with and seen by Dr. Tyrone Nine who agrees with the treatment plan.  3:02 AM Labs show a leukocytosis, hypercalcemia (adjusted level at 7.1), hypokalemia.  Given unknown downtime, some concern for rhabdomyolysis however CK is within normal limits.  Lactic acid within normal limits.  Afebrile on arrival.  No evidence of sepsis.  Small fluid bolus given due to patient's significantly decreased EF.  Covid negative.  Magnesium pending however potassium repleted.  No hypotension here in the emergency department.  Patient remains altered.  CT scan of the  head and neck without acute intracranial bleed or cervical pathology.  Subcu air noted in the tissues of the neck.  Chest x-ray with subcu air, or small pneumothorax and cavitary lesion prompting CT scan of the chest.  Discussed with radiology findings to include large pneumomediastinum and bilateral pulmonary embolisms.  Patient is anticoagulated on Eliquis however unknown if he is taking his medications.  The patient and findings were discussed with Dr. Nicanor Alcon. Given numerous findings and large pneumomediastinum with persistent altered mental status.  Patient will be admitted.   Final Clinical Impression(s) / ED Diagnoses Final diagnoses:    Altered mental status, unspecified altered mental status type  Pneumomediastinum (HCC)  Multiple subsegmental pulmonary emboli without acute cor pulmonale (HCC)  Hypokalemia    Rx / DC Orders ED Discharge Orders    None       Jacquis Paxton, Boyd Kerbs 08/04/19 8588    Melene Plan, DO 08/04/19 1510

## 2019-08-04 ENCOUNTER — Encounter (HOSPITAL_COMMUNITY): Payer: Self-pay | Admitting: Internal Medicine

## 2019-08-04 ENCOUNTER — Emergency Department (HOSPITAL_COMMUNITY): Payer: Medicare Other

## 2019-08-04 DIAGNOSIS — I110 Hypertensive heart disease with heart failure: Secondary | ICD-10-CM | POA: Diagnosis present

## 2019-08-04 DIAGNOSIS — Z515 Encounter for palliative care: Secondary | ICD-10-CM | POA: Diagnosis not present

## 2019-08-04 DIAGNOSIS — D649 Anemia, unspecified: Secondary | ICD-10-CM | POA: Diagnosis present

## 2019-08-04 DIAGNOSIS — E43 Unspecified severe protein-calorie malnutrition: Secondary | ICD-10-CM | POA: Diagnosis present

## 2019-08-04 DIAGNOSIS — N179 Acute kidney failure, unspecified: Secondary | ICD-10-CM | POA: Diagnosis present

## 2019-08-04 DIAGNOSIS — J984 Other disorders of lung: Secondary | ICD-10-CM | POA: Diagnosis not present

## 2019-08-04 DIAGNOSIS — E876 Hypokalemia: Secondary | ICD-10-CM | POA: Diagnosis present

## 2019-08-04 DIAGNOSIS — I2694 Multiple subsegmental pulmonary emboli without acute cor pulmonale: Secondary | ICD-10-CM | POA: Diagnosis not present

## 2019-08-04 DIAGNOSIS — G9341 Metabolic encephalopathy: Secondary | ICD-10-CM

## 2019-08-04 DIAGNOSIS — I5042 Chronic combined systolic (congestive) and diastolic (congestive) heart failure: Secondary | ICD-10-CM | POA: Diagnosis present

## 2019-08-04 DIAGNOSIS — E785 Hyperlipidemia, unspecified: Secondary | ICD-10-CM | POA: Diagnosis present

## 2019-08-04 DIAGNOSIS — Z66 Do not resuscitate: Secondary | ICD-10-CM

## 2019-08-04 DIAGNOSIS — I255 Ischemic cardiomyopathy: Secondary | ICD-10-CM | POA: Diagnosis present

## 2019-08-04 DIAGNOSIS — J9601 Acute respiratory failure with hypoxia: Secondary | ICD-10-CM

## 2019-08-04 DIAGNOSIS — T797XXA Traumatic subcutaneous emphysema, initial encounter: Secondary | ICD-10-CM | POA: Diagnosis present

## 2019-08-04 DIAGNOSIS — J982 Interstitial emphysema: Secondary | ICD-10-CM | POA: Diagnosis not present

## 2019-08-04 DIAGNOSIS — Z20822 Contact with and (suspected) exposure to covid-19: Secondary | ICD-10-CM | POA: Diagnosis present

## 2019-08-04 DIAGNOSIS — I420 Dilated cardiomyopathy: Secondary | ICD-10-CM | POA: Diagnosis present

## 2019-08-04 DIAGNOSIS — Z7189 Other specified counseling: Secondary | ICD-10-CM | POA: Diagnosis not present

## 2019-08-04 DIAGNOSIS — Z681 Body mass index (BMI) 19 or less, adult: Secondary | ICD-10-CM | POA: Diagnosis not present

## 2019-08-04 DIAGNOSIS — I2699 Other pulmonary embolism without acute cor pulmonale: Secondary | ICD-10-CM

## 2019-08-04 DIAGNOSIS — I739 Peripheral vascular disease, unspecified: Secondary | ICD-10-CM | POA: Diagnosis present

## 2019-08-04 DIAGNOSIS — E872 Acidosis: Secondary | ICD-10-CM | POA: Diagnosis not present

## 2019-08-04 DIAGNOSIS — F329 Major depressive disorder, single episode, unspecified: Secondary | ICD-10-CM | POA: Diagnosis present

## 2019-08-04 DIAGNOSIS — E860 Dehydration: Secondary | ICD-10-CM | POA: Diagnosis present

## 2019-08-04 DIAGNOSIS — J188 Other pneumonia, unspecified organism: Secondary | ICD-10-CM | POA: Diagnosis present

## 2019-08-04 DIAGNOSIS — R627 Adult failure to thrive: Secondary | ICD-10-CM | POA: Diagnosis present

## 2019-08-04 LAB — CBC WITH DIFFERENTIAL/PLATELET
Abs Immature Granulocytes: 0.13 10*3/uL — ABNORMAL HIGH (ref 0.00–0.07)
Basophils Absolute: 0 10*3/uL (ref 0.0–0.1)
Basophils Relative: 0 %
Eosinophils Absolute: 0 10*3/uL (ref 0.0–0.5)
Eosinophils Relative: 0 %
HCT: 29.2 % — ABNORMAL LOW (ref 39.0–52.0)
Hemoglobin: 8.5 g/dL — ABNORMAL LOW (ref 13.0–17.0)
Immature Granulocytes: 1 %
Lymphocytes Relative: 4 %
Lymphs Abs: 0.6 10*3/uL — ABNORMAL LOW (ref 0.7–4.0)
MCH: 28.1 pg (ref 26.0–34.0)
MCHC: 29.1 g/dL — ABNORMAL LOW (ref 30.0–36.0)
MCV: 96.7 fL (ref 80.0–100.0)
Monocytes Absolute: 0.4 10*3/uL (ref 0.1–1.0)
Monocytes Relative: 3 %
Neutro Abs: 12.8 10*3/uL — ABNORMAL HIGH (ref 1.7–7.7)
Neutrophils Relative %: 92 %
Platelets: 429 10*3/uL — ABNORMAL HIGH (ref 150–400)
RBC: 3.02 MIL/uL — ABNORMAL LOW (ref 4.22–5.81)
RDW: 15.9 % — ABNORMAL HIGH (ref 11.5–15.5)
WBC: 14 10*3/uL — ABNORMAL HIGH (ref 4.0–10.5)
nRBC: 0 % (ref 0.0–0.2)

## 2019-08-04 LAB — URINALYSIS, ROUTINE W REFLEX MICROSCOPIC
Bilirubin Urine: NEGATIVE
Glucose, UA: NEGATIVE mg/dL
Hgb urine dipstick: NEGATIVE
Ketones, ur: NEGATIVE mg/dL
Leukocytes,Ua: NEGATIVE
Nitrite: NEGATIVE
Protein, ur: NEGATIVE mg/dL
Specific Gravity, Urine: 1.017 (ref 1.005–1.030)
pH: 5 (ref 5.0–8.0)

## 2019-08-04 LAB — COMPREHENSIVE METABOLIC PANEL
ALT: 19 U/L (ref 0–44)
AST: 20 U/L (ref 15–41)
Albumin: 1.1 g/dL — ABNORMAL LOW (ref 3.5–5.0)
Alkaline Phosphatase: 63 U/L (ref 38–126)
Anion gap: 8 (ref 5–15)
BUN: 65 mg/dL — ABNORMAL HIGH (ref 8–23)
CO2: 13 mmol/L — ABNORMAL LOW (ref 22–32)
Calcium: 5.7 mg/dL — CL (ref 8.9–10.3)
Chloride: 124 mmol/L — ABNORMAL HIGH (ref 98–111)
Creatinine, Ser: 1.37 mg/dL — ABNORMAL HIGH (ref 0.61–1.24)
GFR calc Af Amer: 57 mL/min — ABNORMAL LOW (ref >60)
GFR calc non Af Amer: 49 mL/min — ABNORMAL LOW (ref >60)
Glucose, Bld: 85 mg/dL (ref 70–99)
Potassium: 2.9 mmol/L — ABNORMAL LOW (ref 3.5–5.1)
Sodium: 145 mmol/L (ref 135–145)
Total Bilirubin: 0.5 mg/dL (ref 0.3–1.2)
Total Protein: 4.5 g/dL — ABNORMAL LOW (ref 6.5–8.1)

## 2019-08-04 LAB — TYPE AND SCREEN
ABO/RH(D): A POS
Antibody Screen: NEGATIVE

## 2019-08-04 LAB — AMMONIA: Ammonia: 37 umol/L — ABNORMAL HIGH (ref 9–35)

## 2019-08-04 LAB — RESPIRATORY PANEL BY RT PCR (FLU A&B, COVID)
Influenza A by PCR: NEGATIVE
Influenza B by PCR: NEGATIVE
SARS Coronavirus 2 by RT PCR: NEGATIVE

## 2019-08-04 LAB — IRON AND TIBC
Iron: 30 ug/dL — ABNORMAL LOW (ref 45–182)
Saturation Ratios: 18 % (ref 17.9–39.5)
TIBC: 162 ug/dL — ABNORMAL LOW (ref 250–450)
UIBC: 132 ug/dL

## 2019-08-04 LAB — TROPONIN I (HIGH SENSITIVITY)
Troponin I (High Sensitivity): 16 ng/L (ref <18)
Troponin I (High Sensitivity): 21 ng/L — ABNORMAL HIGH (ref <18)
Troponin I (High Sensitivity): 22 ng/L — ABNORMAL HIGH (ref <18)

## 2019-08-04 LAB — RETICULOCYTES
Immature Retic Fract: 22.1 % — ABNORMAL HIGH (ref 2.3–15.9)
RBC.: 4.23 MIL/uL (ref 4.22–5.81)
Retic Count, Absolute: 70.2 10*3/uL (ref 19.0–186.0)
Retic Ct Pct: 1.7 % (ref 0.4–3.1)

## 2019-08-04 LAB — VITAMIN B12: Vitamin B-12: 528 pg/mL (ref 180–914)

## 2019-08-04 LAB — MAGNESIUM: Magnesium: 2.3 mg/dL (ref 1.7–2.4)

## 2019-08-04 LAB — LACTIC ACID, PLASMA
Lactic Acid, Venous: 1.3 mmol/L (ref 0.5–1.9)
Lactic Acid, Venous: 1.4 mmol/L (ref 0.5–1.9)

## 2019-08-04 LAB — PROCALCITONIN: Procalcitonin: 0.49 ng/mL

## 2019-08-04 LAB — HEPARIN LEVEL (UNFRACTIONATED): Heparin Unfractionated: >2.2 IU/mL — ABNORMAL HIGH (ref 0.30–0.70)

## 2019-08-04 LAB — APTT: aPTT: 99 seconds — ABNORMAL HIGH (ref 24–36)

## 2019-08-04 LAB — FERRITIN: Ferritin: 383 ng/mL — ABNORMAL HIGH (ref 24–336)

## 2019-08-04 LAB — MRSA PCR SCREENING: MRSA by PCR: NEGATIVE

## 2019-08-04 LAB — FOLATE: Folate: 3.7 ng/mL — ABNORMAL LOW (ref >5.9)

## 2019-08-04 LAB — BRAIN NATRIURETIC PEPTIDE: B Natriuretic Peptide: 94.1 pg/mL (ref 0.0–100.0)

## 2019-08-04 LAB — CK: Total CK: 84 U/L (ref 49–397)

## 2019-08-04 MED ORDER — IOHEXOL 300 MG/ML  SOLN
75.0000 mL | Freq: Once | INTRAMUSCULAR | Status: AC | PRN
  Administered 2019-08-04: 75 mL via INTRAVENOUS

## 2019-08-04 MED ORDER — PIPERACILLIN-TAZOBACTAM 3.375 G IVPB
3.3750 g | Freq: Three times a day (TID) | INTRAVENOUS | Status: DC
  Administered 2019-08-05 – 2019-08-08 (×11): 3.375 g via INTRAVENOUS
  Filled 2019-08-04 (×10): qty 50

## 2019-08-04 MED ORDER — STERILE WATER FOR INJECTION IV SOLN
INTRAVENOUS | Status: DC
  Filled 2019-08-04 (×4): qty 850

## 2019-08-04 MED ORDER — CALCIUM GLUCONATE-NACL 1-0.675 GM/50ML-% IV SOLN
1.0000 g | Freq: Once | INTRAVENOUS | Status: AC
  Administered 2019-08-04: 1000 mg via INTRAVENOUS
  Filled 2019-08-04: qty 50

## 2019-08-04 MED ORDER — ACETAMINOPHEN 650 MG RE SUPP
650.0000 mg | Freq: Four times a day (QID) | RECTAL | Status: DC | PRN
  Filled 2019-08-04: qty 1

## 2019-08-04 MED ORDER — PIPERACILLIN-TAZOBACTAM 3.375 G IVPB
3.3750 g | Freq: Three times a day (TID) | INTRAVENOUS | Status: DC
  Administered 2019-08-04 (×2): 3.375 g via INTRAVENOUS
  Filled 2019-08-04 (×2): qty 50

## 2019-08-04 MED ORDER — CALCIUM CARBONATE ANTACID 500 MG PO CHEW
2.0000 | CHEWABLE_TABLET | Freq: Once | ORAL | Status: AC
  Administered 2019-08-04: 400 mg via ORAL
  Filled 2019-08-04: qty 2

## 2019-08-04 MED ORDER — POTASSIUM CHLORIDE 10 MEQ/100ML IV SOLN
10.0000 meq | INTRAVENOUS | Status: AC
  Administered 2019-08-04 (×3): 10 meq via INTRAVENOUS
  Filled 2019-08-04 (×3): qty 100

## 2019-08-04 MED ORDER — HEPARIN (PORCINE) 25000 UT/250ML-% IV SOLN
1000.0000 [IU]/h | INTRAVENOUS | Status: DC
  Administered 2019-08-04: 1000 [IU]/h via INTRAVENOUS
  Filled 2019-08-04: qty 250

## 2019-08-04 MED ORDER — ACETAMINOPHEN 325 MG PO TABS: 650.0000 mg | ORAL_TABLET | Freq: Four times a day (QID) | ORAL | Status: DC | PRN

## 2019-08-04 NOTE — H&P (Signed)
History and Physical    Kevin Reilly WLN:[id number] DOB: [birth year] DOA: 08/03/2019  PCP: Patient, No Pcp Per Patient coming from: Home  Chief Complaint: Altered mental status  HPI: Kevin Reilly is a 79 y.o. male with medical history significant of bilateral carotid artery disease, CHF, PAD, DVT, hypertension, hyperlipidemia, history of PE on Eliquis presenting via EMS for evaluation of altered mental status.  It is reported that the patient lives alone and has a caregiver who checks on him.  Caregiver found him lying on the floor and acting abnormally.  Patient was slow to respond.  Upon EMS arrival, heart rate 120 and blood pressure 104/70.  Patient is currently oriented to self only and appears confused.  He is not sure why he is here.  Reports shortness of breath and poor oral intake.  Denies fevers, chills, cough, chest pain, nausea, vomiting, abdominal pain, or diarrhea.  States he had a fall last week and thinks he might have hit his head.  He lives by himself.  States his wife, daughter, and dog died.  He has no other family members.  ED Course: Afebrile.  Mildly tachycardic on arrival, heart rate subsequently improved.  Blood pressure soft.  Labs showing WBC count 14.0.  Lactic acid normal.  Hemoglobin 8.5, previously in the 10-11 range.  Potassium 2.9.  Magnesium level normal.  Bicarb 13.  BUN 65, creatinine 1.3. Albumin 1.1.  LFTs normal.  Corrected calcium 8.0.  CK normal.  No significant elevation of ammonia level.  BNP normal.  High-sensitivity troponin 16> 21.  EKG without acute ischemic changes.  UA and urine culture pending.  Blood culture x2 pending.  ABG with pH 7.40, PCO2 35, and PO2 176.  SARS-CoV-2 PCR test negative.  Influenza panel negative.  Head CT negative for acute intracranial abnormality.  CT C-spine showing marked severity superior pneumomediastinum which extends superiorly to involve the soft tissues along the posterior and posterolateral aspect of the left  supraclavicular region on the left. Marked severity multilevel degenerative changes, most prominent at the levels of C4-C5, C5-C6 and C6-C7. 2 mm anterolisthesis of the C4 on C5 vertebral body, C5-C6 vertebral body, and C7-T1 vertebral bodies.  CT chest with contrast showing large amount of pneumomediastinum which extends superiorly to involve the soft tissues posterior and lateral to the oropharynx. Mild to moderate severity bilateral pulmonary embolism, right greater than left. 9.2 cm x 4.4 cm x 4.2 cm lobulated, thick walled left upper lobe cavitary lesion. This may represent a large cavitary pneumonia, however, a cavitary neoplasm cannot be excluded. Additional smaller right middle lobe cavitary lesion with a 9 mm noncalcified right lower lobe lung nodule. Moderate to marked severity emphysematous lung disease.  Patient received potassium supplementation and a 500 cc normal saline bolus.  ED provider discussed the case with Dr. Warrick Parisian who did not feel that the patient needed ICU admission.  Review of Systems:  All systems reviewed and apart from history of presenting illness, are negative.  Past Medical History:  Diagnosis Date  . Bilateral carotid artery disease (HCC)   . Cardiomyopathy (HCC) 05/2019  . CHF (congestive heart failure) (HCC)   . Critical lower limb ischemia   . DVT (deep venous thrombosis) (HCC)   . Dyspnea on exertion 05/30/2019  . Hyperlipidemia    takes Atorvastatin daily  . Hypertension    takes Amlodipine and Ramipril daily  . Joint swelling   . Nocturia   . Peripheral arterial disease (HCC)   . Pulmonary  embolism ([place])    Refer to VQ scan.     Past Surgical History:  Procedure Laterality Date  . BACK SURGERY    . colonosocpy    . ENDARTERECTOMY FEMORAL Right 02/25/2015   Procedure: ENDARTERECTOMYRight  FEMORAL Endarterectomy with profundaplasty.;  Surgeon: Elam Dutch, MD;  Location: [facility];  Service: Vascular;  Laterality: Right;  . FEMORAL-POPLITEAL  BYPASS GRAFT Right 02/25/2015   Procedure: BYPASS GRAFT FEMORAL-POPLITEAL ARTERY using non-reversed saphenous vein.;  Surgeon: Elam Dutch, MD;  Location: [facility];  Service: Vascular;  Laterality: Right;  . PERIPHERAL VASCULAR CATHETERIZATION N/A 01/24/2015   Procedure: Abdominal Aortogram;  Surgeon: Elam Dutch, MD;  Location: [facility];  Service: Cardiovascular;  Laterality: N/A;     reports that he has quit smoking. His smoking use included cigarettes. He has never used smokeless tobacco. He reports that he does not drink alcohol or use drugs.  Allergies  Allergen Reactions  . Chocolate Hives  . Sulfur Rash    History reviewed. No pertinent family history.  Prior to Admission medications   Medication Sig Start Date End Date Taking? Authorizing Provider  acetaminophen (TYLENOL) 500 MG tablet Take 1,500 mg by mouth 2 (two) times daily as needed for headache.     [provider]  albuterol (VENTOLIN HFA) 108 (90 Base) MCG/ACT inhaler Inhale 2 puffs into the lungs every 4 (four) hours as needed for wheezing or shortness of breath.  05/02/19   [provider]  amLODipine (NORVASC) 5 MG tablet Take 5 mg by mouth daily. 06/02/19   [provider]  Apixaban Starter Pack (ELIQUIS STARTER PACK) 5 MG TBPK Take as directed on package: start with two-5mg  tablets twice daily for 7 days. On day 8, switch to one-5mg  tablet twice daily. 06/08/19   Tolia, Sunit, DO  aspirin 81 MG chewable tablet Chew 81 mg by mouth daily.     [provider]  atorvastatin (LIPITOR) 20 MG tablet Take 1 tablet (20 mg total) by mouth daily. 05/30/19   Tolia, Sunit, DO  furosemide (LASIX) 20 MG tablet Take 1 tablet (20 mg total) by mouth daily. 05/30/19   Tolia, Sunit, DO  metolazone (ZAROXOLYN) 2.5 MG tablet TAKE 1 TABLET BY MOUTH EVERY MONDAY, WEDNESDAY, AND FRIDAY. Patient taking differently: Take 2.5 mg by mouth every Monday, Wednesday, and Friday.  06/28/19   Tolia, Sunit,  DO  potassium chloride (KLOR-CON) 10 MEQ tablet Take 4 tablets (40 mEq total) by mouth daily for 4 days. Patient not taking: Reported on 08/03/2019 07/09/19 07/13/19  Delia Heady, PA-C  potassium chloride SA (KLOR-CON) 20 MEQ tablet Take 1 tablet (20 mEq total) by mouth daily. Patient may request a liquid formulation. 05/30/19   Tolia, Sunit, DO  ramipril (ALTACE) 10 MG capsule TAKE 1 CAPSULE BY MOUTH AT BEDTIME Patient taking differently: Take 10 mg by mouth at bedtime.  06/22/19   Rex Kras, DO    Physical Exam: Vitals:   08/04/19 0430 08/04/19 0500 08/04/19 0700 08/04/19 0715  BP: 99/74 (!) 99/49 (!) 105/53   Pulse:    72  Resp: 17 13 16 14   Temp:      TempSrc:      SpO2:    100%    Physical Exam  Constitutional: No distress.  Frail elderly male  HENT:  Head: Normocephalic.  Dry mucous membranes  Eyes: Right eye exhibits no discharge. Left eye exhibits no discharge.  Cardiovascular: Normal rate, regular rhythm and intact distal pulses.  Pulmonary/Chest: Effort normal. No respiratory distress. He has no wheezes.  Abdominal: Soft. Bowel sounds are normal. He exhibits no distension. There is no abdominal tenderness. There is no guarding.  Musculoskeletal:        General: No edema.     Cervical back: Neck supple.  Neurological:  Awake and alert Oriented to self only Moving all extremities spontaneously  Skin: Skin is warm and dry. He is not diaphoretic.     Labs on Admission: I have personally reviewed following labs and imaging studies  CBC: Recent Labs  Lab 08/03/19 2223 08/03/19 2314  WBC 14.0*  --   NEUTROABS 12.8*  --   HGB 8.5* 11.9*  HCT 29.2* 35.0*  MCV 96.7  --   PLT 429*  --    Basic Metabolic Panel: Recent Labs  Lab 08/03/19 2223 08/03/19 2314 08/04/19 0322  NA 145 141  --   K 2.9* 4.2  --   CL 124*  --   --   CO2 13*  --   --   GLUCOSE 85  --   --   BUN 65*  --   --   CREATININE 1.37*  --   --   CALCIUM 5.7*  --   --   MG  --   --  2.3    GFR: CrCl cannot be calculated (Unknown ideal weight.). Liver Function Tests: Recent Labs  Lab 08/03/19 2223  AST 20  ALT 19  ALKPHOS 63  BILITOT 0.5  PROT 4.5*  ALBUMIN 1.1*   No results for input(s): LIPASE, AMYLASE in the last 168 hours. Recent Labs  Lab 08/03/19 2224  AMMONIA 37*   Coagulation Profile: Recent Labs  Lab 08/03/19 2223  INR 1.9*   Cardiac Enzymes: Recent Labs  Lab 08/03/19 2223  CKTOTAL 84   BNP (last 3 results) Recent Labs    06/05/19 1530 06/08/19 1439 06/21/19 1426  PROBNP 12,549* 16,303* 8,187*   HbA1C: No results for input(s): HGBA1C in the last 72 hours. CBG: No results for input(s): GLUCAP in the last 168 hours. Lipid Profile: No results for input(s): CHOL, HDL, LDLCALC, TRIG, CHOLHDL, LDLDIRECT in the last 72 hours. Thyroid Function Tests: No results for input(s): TSH, T4TOTAL, FREET4, T3FREE, THYROIDAB in the last 72 hours. Anemia Panel: No results for input(s): VITAMINB12, FOLATE, FERRITIN, TIBC, IRON, RETICCTPCT in the last 72 hours. Urine analysis:    Component Value Date/Time   COLORURINE YELLOW 07/09/2019 1515   APPEARANCEUR CLEAR 07/09/2019 1515   LABSPEC 1.010 07/09/2019 1515   PHURINE 5.0 07/09/2019 1515   GLUCOSEU NEGATIVE 07/09/2019 1515   HGBUR SMALL (A) 07/09/2019 1515   BILIRUBINUR NEGATIVE 07/09/2019 1515   KETONESUR NEGATIVE 07/09/2019 1515   PROTEINUR NEGATIVE 07/09/2019 1515   UROBILINOGEN 0.2 02/21/2015 1554   NITRITE NEGATIVE 07/09/2019 1515   LEUKOCYTESUR NEGATIVE 07/09/2019 1515    Radiological Exams on Admission: CT Head Wo Contrast  Result Date: 08/04/2019 CLINICAL DATA:  Lying on the floor. EXAM: CT HEAD WITHOUT CONTRAST TECHNIQUE: Contiguous axial images were obtained from the base of the skull through the vertex without intravenous contrast. COMPARISON:  None. FINDINGS: Brain: There is mild cerebral atrophy with widening of the extra-axial spaces and ventricular dilatation. There are areas of  decreased attenuation within the white matter tracts of the supratentorial brain, consistent with microvascular disease changes. Vascular: No hyperdense vessel or unexpected calcification. Skull: Normal. Negative for fracture or focal lesion. Sinuses/Orbits: No acute finding. Other: None. IMPRESSION: 1. Generalized cerebral atrophy.  2. No acute intracranial abnormality. Electronically Signed   By: Aram Candela M.D.   On: 08/04/2019 02:11   CT Chest W Contrast  Result Date: 08/04/2019 CLINICAL DATA:  Found on the floor. EXAM: CT CHEST WITH CONTRAST TECHNIQUE: Multidetector CT imaging of the chest was performed during intravenous contrast administration. CONTRAST:  60mL OMNIPAQUE IOHEXOL 300 MG/ML  SOLN COMPARISON:  None. FINDINGS: Cardiovascular: A moderate amount of intraluminal low attenuation is seen involving multiple lower lobe branches of the right pulmonary artery (axial CT images 92 through 99, CT series number 4). A mild amount of intraluminal low attenuation is also seen involving several upper lobe branches of the left pulmonary artery (axial CT images 82 through 86, CT series number 4). There is marked severity calcification of the thoracic aorta without evidence of aneurysmal dilatation or dissection. Normal heart size. No pericardial effusion. Marked severity coronary artery calcification is seen. Mediastinum/Nodes: A large amount of air is seen throughout the mediastinum. This is seen at the level of the lung bases and extends superiorly to involve the soft tissues posterior and lateral to the oropharynx. Lungs/Pleura: There is moderate to marked severity emphysematous lung disease. A 9.2 cm x 4.4 cm x 4.2 cm lobulated, thick walled cavitary lesion is seen along the posterolateral aspect of the left upper lobe. A 2.9 cm x 1.1 cm x 1.4 cm thin walled cavitary lesion is seen along the posterolateral aspect of the right middle lobe. A 9 mm noncalcified lung nodule is seen within the posterior  aspect of the right lower lobe (axial CT image 93, CT series number 5). Upper Abdomen: A small amount of air density is seen posterior to the infrarenal abdominal aorta (axial CT images 153 through 157, CT series number 4), likely consistent with extension of the previously noted pneumomediastinum. Musculoskeletal: Multilevel degenerative changes seen throughout the thoracic spine. IMPRESSION: 1. Large amount of pneumomediastinum which extends superiorly to involve the soft tissues posterior and lateral to the oropharynx. 2. Mild to moderate severity bilateral pulmonary embolism, right greater than left. 3. 9.2 cm x 4.4 cm x 4.2 cm lobulated, thick walled left upper lobe cavitary lesion. This may represent a large cavitary pneumonia, however, a cavitary neoplasm cannot be excluded. 4. Additional smaller right middle lobe cavitary lesion with a 9 mm noncalcified right lower lobe lung nodule. Attention on follow-up is recommended. 5. Moderate to marked severity emphysematous lung disease. Aortic Atherosclerosis (ICD10-I70.0) and Emphysema (ICD10-J43.9). Electronically Signed   By: Aram Candela M.D.   On: 08/04/2019 02:40   CT Cervical Spine Wo Contrast  Result Date: 08/04/2019 CLINICAL DATA:  Found on the floor. EXAM: CT CERVICAL SPINE WITHOUT CONTRAST TECHNIQUE: Multidetector CT imaging of the cervical spine was performed without intravenous contrast. Multiplanar CT image reconstructions were also generated. COMPARISON:  July 09, 2019 FINDINGS: Alignment: There is approximately 2 mm anterolisthesis of the C4 on C5 vertebral body, with approximately 3 mm anterolisthesis of the C5 on C6 vertebral body. 3 mm anterolisthesis of C7 on T1 is also noted. Skull base and vertebrae: No acute fracture. No primary bone lesion or focal pathologic process. Soft tissues and spinal canal: No prevertebral fluid or swelling. No visible canal hematoma. Disc levels: Moderate severity endplate sclerosis is seen at the levels of  C4-C5, C5-C6 and C6-C7. Moderate to marked severity intervertebral disc space narrowing is also seen at these levels. Marked severity posterior intervertebral disc space narrowing is seen at the level of C3-C4. There is marked severity bilateral multilevel  facet joint hypertrophy. Upper chest: A marked amount of air is seen within the visualized portion of the superior mediastinum. This extends superiorly to involve the soft tissues along the posterior and posterolateral aspect of the a moderate amount of soft tissue air is also seen along the supraclavicular region on the left. Oropharynx. Other: None. IMPRESSION: 1. Marked severity superior pneumomediastinum which extends superiorly to involve the soft tissues along the posterior and posterolateral aspect of the left supraclavicular region on the left. Further evaluation with a chest CT is recommended. 2. Marked severity multilevel degenerative changes, most prominent at the levels of C4-C5, C5-C6 and C6-C7. 3. 2 mm anterolisthesis of the C4 on C5 vertebral body, C5-C6 vertebral body, and C7-T1 vertebral bodies. Electronically Signed   By: Aram Candela M.D.   On: 08/04/2019 02:19   DG Chest Port 1 View  Result Date: 08/03/2019 CLINICAL DATA:  Altered mental status. EXAM: PORTABLE CHEST 1 VIEW COMPARISON:  Chest radiograph 07/09/2019. No prior chest CT. FINDINGS: Persistent airspace disease in the periphery of the left mid lung, now with air-filled component concerning for cavitation. Patchy opacities in the right lung have improved from prior exam. Normal heart size with unchanged mediastinal contours. Short lucency outlining the aortic arch was not seen on prior. Vertically oriented lucency projecting over the central left lung base. There is air in the left supraclavicular soft tissues. Possible tiny left pneumothorax. No significant pleural fluid. Underlying emphysema. No acute rib fractures are visualized. IMPRESSION: 1. Persistent airspace disease in  the periphery of the left mid lung, now with air-filled component concerning for cavitation. Bilateral patchy lung opacities have otherwise improved from radiograph last month. 2. Multiple lucencies in the left hemithorax are subtle but suspicious for pneumothorax, including adjacent to the aortic arch, medial left lung base, and possibly left apex. 3. Soft tissue air in the left supraclavicular soft tissues. 4. Recommend further characterization with chest CT. These results were called by telephone at the time of interpretation on 08/03/2019 at 10:46 pm to PA [facility] , who verbally acknowledged these results. Electronically Signed   By: Narda Rutherford M.D.   On: 08/03/2019 22:47    EKG: Independently reviewed.  Sinus rhythm, LVH, T wave abnormality in inferior and lateral leads similar to prior EKGs from February 2021.  Assessment/Plan Principal Problem:   Pneumomediastinum (HCC) Active Problems:   Pulmonary embolism (HCC)   Cavitary lesion of lung   Acute respiratory failure with hypoxemia (HCC)   Acute metabolic encephalopathy   Large pneumomediastinum: Possibly traumatic secondary to falling at home.  No concurrent pneumothorax.  Currently satting well on 2 L supplemental oxygen.  Seen by PCCM. -Admit to progressive care unit and monitor closely.  Continue supplemental oxygen to maintain oxygen saturation greater than 92%.    Bilateral pulmonary emboli: Patient has a history of PE and is on Eliquis.  Could be due to Eliquis failure versus noncompliance.  BNP normal.  Troponin mildly elevated. -Start heparin for anticoagulation.  Order echocardiogram.  Trend troponin.  Cavitary lung lesions: CT showing 9.2 cm x 4.4 cm x 4.2 cm lobulated, thick walled left upper lobe cavitary lesion.  CT also showing additional smaller right middle lobe cavitary lesion with a 9 mm noncalcified right lower lobe lung nodule. Pulmonology feels cavitary lesions are possibly secondary to prior PE with  infarction versus necrotizing infection, less likely TB. -Zosyn started.  Check procalcitonin level.  Order sputum culture.  Blood culture x2 pending.  Continue to monitor WBC  count.  Will need follow-up CT scan in 6 weeks to monitor 9 mm nodule.  Acute hypoxemic respiratory failure: Continue supplemental oxygen  Acute metabolic encephalopathy: Suspect related to acute infection/ dehydration.  Patient is currently oriented to self only.  No focal neuro deficit.  Head CT negative for acute intracranial abnormality.  Continue IV fluid hydration and antibiotic.  Anemia: Hemoglobin 8.5, previously in the 10-11 range.  Order anemia panel and FOBT.  Hypokalemia: Potassium 3.9.  Suspect related to decreased p.o. intake and home diuretic use.  EKG without acute changes.  Magnesium level normal.  Plan is to replete potassium and continue to monitor BMP.  AKI: BUN 65, creatinine 1.3.  Baseline creatinine 0.8.  Suspect prerenal due to dehydration and home diuretic use.  Continue IV fluid hydration.  Monitor renal function and urine output.  Avoid nephrotoxic agents.  Normal anion gap metabolic acidosis: Suspect related to AKI.  Bicarb 13, anion gap 8.  pH 7.40 on ABG.  Start bicarb infusion and continue to monitor closely.  Hypocalcemia: Corrected calcium 8.0.  Give calcium supplementation and check ionized calcium level.  DVT prophylaxis: Heparin Code Status: Full code Family Communication: No family available at this time. Disposition Plan: Anticipate discharge after clinical improvement. Consults called: PCCM Admission status: It is my clinical opinion that admission to INPATIENT is reasonable and necessary because of the expectation that this patient will require hospital care that crosses at least 2 midnights to treat this condition based on the medical complexity of the problems presented.  Given the aforementioned information, the predictability of an adverse outcome is felt to be  significant.  The medical decision making on this patient was of high complexity and the patient is at high risk for clinical deterioration, therefore this is a level 3 visit.  John Giovanni MD [facility]  If 7PM-7AM, please contact night-coverage [email or web address]  08/04/2019, 7:28 AM

## 2019-08-04 NOTE — Consult Note (Signed)
NAME:  Kevin Reilly, MRN:  [id number], DOB:  09-17-1940, LOS: 0 ADMISSION DATE:  08/03/2019, CONSULTATION DATE:  08/04/19 REFERRING MD:  EDP, CHIEF COMPLAINT:  AMS   Brief History   79 year old male with history of HFrEF, hypertension, DVT/PE who is supposed to be taking Eliquis who presented confused, was found on the floor by his caregiver.  Found to have pneumomediastinum and bilateral pulmonary emboli.  Stable on room air, PCCM asked to consult.  History of present illness   Kevin Reilly is a 79 year old male with a history of HFrEF, hypertension, DVT/PE who is supposed to be taking Eliquis who presented confused, was found on the floor by his caregiver.  He was awake, but oriented only to person.  In the emergency department patient was tachycardic, blood pressure 104/70 and required only 2 L nasal cannula.  Labs significant for potassium of 2.9, 7, calcium 5.7, WBCs 14 K, hemoglobin 8.5.  Chest x-ray revealed pneumomediastinum, further imaging with CT chest showed large pneumomediastinum extending superiorly to involve the soft tissues posterior to the oropharynx, mild to moderate bilateral PE, left upper lobe and right middle lobe cavitary lesion with 9 mm RLL lung nodule.  He was given 43 M EQ's K and 500 cc bolus admit to stepdown, PCCM also consulted.  Past Medical History   has a past medical history of Bilateral carotid artery disease ([place]), Cardiomyopathy ([place]) (05/2019), CHF (congestive heart failure) ([place]), Critical lower limb ischemia, DVT (deep venous thrombosis) ([place]), Dyspnea on exertion (05/30/2019), Hyperlipidemia, Hypertension, Joint swelling, Nocturia, Peripheral arterial disease ([place]), and Pulmonary embolism ([place]).   Significant Hospital Events   08/04/19-admit to stepdown, PCCM consult   Consults:  PCCM  Procedures:    Significant Diagnostic Tests:  08/04/19 CT head>> lysed atrophy without acute intracranial abnormality 08/04/19 CT cervical spine>> multilevel  degenerative changes 08/04/19 CT chest>> pneumomediastinum extending superiorly to involve the soft tissues posterior and lateral to the oropharynx, mild to moderate severity bilateral PE left upper lobe and right middle lobe cavitary lesions   Micro Data:  08/04/19 RVP>>negative 08/04/19 BCx2>> 08/04/19 UC>>   Antimicrobials:     Interim history/subjective:  Patient conversational, states he does not remember how he got to the emergency department.  Oriented to person, has no complaints and remains on room air  Objective   Blood pressure (!) 99/49, pulse 78, temperature 97.8 F (36.6 C), temperature source Rectal, resp. rate 13, SpO2 96 %.        Intake/Output Summary (Last 24 hours) at 08/04/2019 0555 Last data filed at 08/04/2019 0523 Gross per 24 hour  Intake 800 ml  Output --  Net 800 ml   There were no vitals filed for this visit.  General: Elderly, thin poorly nourished male awake and in no distress HEENT: MM pink/moist Neuro: Oriented to person, conversational, following commands, disoriented to situation CV: s1s2 RRR, no m/r/g PULM: Generally poor air movement, no significant wheezing, rhonchi muscle use GI: soft, bsx4 active  Extremities: warm/dry, no edema  Skin: no rashes or lesions  Resolved Hospital Problem list     Assessment & Plan:   Pneumomediastinum -Per notes has been falling at home, possibly traumatic etiology -No concurrent pneumothorax P: -Monitor closely in stepdown, continue supplemental O2 to maintain oxygen sats greater than 92%  Bilateral pulmonary emboli -Unclear chronicity as VQ scan 05/2019 with multiple bilateral perfusion defects and was prescribed Eliquis -Patient lives alone, compliant with his medication, though INR elevated so may be taking his anticoagulation P: -  continue Eliquis, consider repeat echo to evaluate for further right heart strain  Cavitary lung lesions bilaterally, noncalcified 9 mm RLL nodule -Cavitary  lesions possibly secondary to prior PE with infarction versus necrotizing infection, less likely TB P: -Follow blood cultures - obtain sputum culture, trend procal -Cover for anaerobes with Zosyn -Will need f/u CT chest to monitor 44mm nodule   Emphysema -former smoker, on albuterol at home -no significant wheezing or respiratory failure on admission P: -add prn duonebs     Labs   CBC: Recent Labs  Lab 08/03/19 2223 08/03/19 2314  WBC 14.0*  --   NEUTROABS 12.8*  --   HGB 8.5* 11.9*  HCT 29.2* 35.0*  MCV 96.7  --   PLT 429*  --     Basic Metabolic Panel: Recent Labs  Lab 08/03/19 2223 08/03/19 2314 08/04/19 0322  NA 145 141  --   K 2.9* 4.2  --   CL 124*  --   --   CO2 13*  --   --   GLUCOSE 85  --   --   BUN 65*  --   --   CREATININE 1.37*  --   --   CALCIUM 5.7*  --   --   MG  --   --  2.3   GFR: CrCl cannot be calculated (Unknown ideal weight.). Recent Labs  Lab 08/03/19 2223  WBC 14.0*  LATICACIDVEN 1.3    Liver Function Tests: Recent Labs  Lab 08/03/19 2223  AST 20  ALT 19  ALKPHOS 63  BILITOT 0.5  PROT 4.5*  ALBUMIN 1.1*   No results for input(s): LIPASE, AMYLASE in the last 168 hours. Recent Labs  Lab 08/03/19 2224  AMMONIA 37*    ABG    Component Value Date/Time   PHART 7.401 08/03/2019 2314   PCO2ART 35.9 08/03/2019 2314   PO2ART 176 (H) 08/03/2019 2314   HCO3 22.4 08/03/2019 2314   TCO2 23 08/03/2019 2314   ACIDBASEDEF 2.0 08/03/2019 2314   O2SAT 100.0 08/03/2019 2314     Coagulation Profile: Recent Labs  Lab 08/03/19 2223  INR 1.9*    Cardiac Enzymes: Recent Labs  Lab 08/03/19 2223  CKTOTAL 84    HbA1C: No results found for: HGBA1C  CBG: No results for input(s): GLUCAP in the last 168 hours.  Review of Systems:   Unable to obtain secondary to AMS  Past Medical History  He,  has a past medical history of Bilateral carotid artery disease (HCC), Cardiomyopathy (HCC) (05/2019), CHF (congestive heart  failure) (HCC), Critical lower limb ischemia, DVT (deep venous thrombosis) (HCC), Dyspnea on exertion (05/30/2019), Hyperlipidemia, Hypertension, Joint swelling, Nocturia, Peripheral arterial disease (HCC), and Pulmonary embolism (HCC).   Surgical History    Past Surgical History:  Procedure Laterality Date  . BACK SURGERY    . colonosocpy    . ENDARTERECTOMY FEMORAL Right 02/25/2015   Procedure: ENDARTERECTOMYRight  FEMORAL Endarterectomy with profundaplasty.;  Surgeon: Sherren Kerns, MD;  Location: [facility] OR;  Service: Vascular;  Laterality: Right;  . FEMORAL-POPLITEAL BYPASS GRAFT Right 02/25/2015   Procedure: BYPASS GRAFT FEMORAL-POPLITEAL ARTERY using non-reversed saphenous vein.;  Surgeon: Sherren Kerns, MD;  Location: [facility] OR;  Service: Vascular;  Laterality: Right;  . PERIPHERAL VASCULAR CATHETERIZATION N/A 01/24/2015   Procedure: Abdominal Aortogram;  Surgeon: Sherren Kerns, MD;  Location: [facility] INVASIVE CV LAB;  Service: Cardiovascular;  Laterality: N/A;     Social History   reports that he has quit smoking.  His smoking use included cigarettes. He has never used smokeless tobacco. He reports that he does not drink alcohol or use drugs.   Family History   His family history is not on file.   Allergies Allergies  Allergen Reactions  . Chocolate Hives  . Sulfur Rash     Home Medications  Prior to Admission medications   Medication Sig Start Date End Date Taking? Authorizing Provider  acetaminophen (TYLENOL) 500 MG tablet Take 1,500 mg by mouth 2 (two) times daily as needed for headache.     [provider]  albuterol (VENTOLIN HFA) 108 (90 Base) MCG/ACT inhaler Inhale 2 puffs into the lungs every 4 (four) hours as needed for wheezing or shortness of breath.  05/02/19   [provider]  amLODipine (NORVASC) 5 MG tablet Take 5 mg by mouth daily. 06/02/19   [provider]  Apixaban Starter Pack (ELIQUIS STARTER PACK) 5 MG TBPK Take as directed on  package: start with two-5mg  tablets twice daily for 7 days. On day 8, switch to one-5mg  tablet twice daily. 06/08/19   Tolia, Sunit, DO  aspirin 81 MG chewable tablet Chew 81 mg by mouth daily.     [provider]  atorvastatin (LIPITOR) 20 MG tablet Take 1 tablet (20 mg total) by mouth daily. 05/30/19   Tolia, Sunit, DO  furosemide (LASIX) 20 MG tablet Take 1 tablet (20 mg total) by mouth daily. 05/30/19   Tolia, Sunit, DO  metolazone (ZAROXOLYN) 2.5 MG tablet TAKE 1 TABLET BY MOUTH EVERY MONDAY, WEDNESDAY, AND FRIDAY. Patient taking differently: Take 2.5 mg by mouth every Monday, Wednesday, and Friday.  06/28/19   Tolia, Sunit, DO  potassium chloride (KLOR-CON) 10 MEQ tablet Take 4 tablets (40 mEq total) by mouth daily for 4 days. Patient not taking: Reported on 08/03/2019 07/09/19 07/13/19  Dietrich Pates, PA-C  potassium chloride SA (KLOR-CON) 20 MEQ tablet Take 1 tablet (20 mEq total) by mouth daily. Patient may request a liquid formulation. 05/30/19   Tolia, Sunit, DO  ramipril (ALTACE) 10 MG capsule TAKE 1 CAPSULE BY MOUTH AT BEDTIME Patient taking differently: Take 10 mg by mouth at bedtime.  06/22/19   Tessa Lerner, DO     Critical care time: 50 minutes    CRITICAL CARE Performed by: Darcella Gasman Tyianna Menefee   Total critical care time: 50 minutes  Critical care time was exclusive of separately billable procedures and treating other patients.  Critical care was necessary to treat or prevent imminent or life-threatening deterioration.  Critical care was time spent personally by me on the following activities: development of treatment plan with patient and/or surrogate as well as nursing, discussions with consultants, evaluation of patient's response to treatment, examination of patient, obtaining history from patient or surrogate, ordering and performing treatments and interventions, ordering and review of laboratory studies, ordering and review of radiographic studies, pulse oximetry and  re-evaluation of patient's condition.   Darcella Gasman Siana Panameno, PA-C

## 2019-08-04 NOTE — Plan of Care (Signed)

## 2019-08-04 NOTE — Progress Notes (Signed)
Patient seen and examined.  Waiting in the emergency room to find inpatient bed.  Admitted by nighttime hospitalist early morning hours.  Patient could not urinate, Foley catheter had to be inserted resulting in 1 L urine output. Patient is more awake now.  He tells me that he lives alone, he has not eaten anything for 1 week.  He has a history of congestive heart failure, peripheral artery disease, DVT, PE and on Eliquis.  Caregiver found him lying on the floor and altered.  He was slow to respond.  He thinks he might have fallen last week.  In the emergency room, CT scan of the spine showed superior pneumomediastinum.  CT scan of the chest showed bilateral pulmonary embolism, left upper lobe cavitary lesion.  Patient on room air.  Case was discussed with pulmonary, admitted to medical service with IV heparin and antibiotics.  Large pneumomediastinum: Possibly traumatic.  Currently on room air.  Conservative management.  We will continue to monitor. Bilateral pulmonary emboli: On Eliquis.  Probably not taking.  We will continue on heparin until clinically stabilizes. Cavitary lung lesions: Cancer versus cavitary pneumonia or ischemic infarctions.  Pulmonary recommended follow-up CT scan in 3 months.  Patient with no active symptoms.  Unlikely tuberculosis as per PCCM.  Cultures done.  Sputum cultures will be done.  Currently on antibiotics that we will continue.  Multiple medical problems, severe protein calorie malnutrition, albumin of 1.1: Patient is an overall poor clinical status.  He has very high mortality given multiple medical issues as well as severe protein calorie malnutrition.  He may also need placement.  Consult PT OT and Child psychotherapist.  Also consult palliative care. Patient is currently stable to transfer to progressive level of care.

## 2019-08-04 NOTE — Progress Notes (Signed)
ANTICOAGULATION CONSULT NOTE - Initial Consult  Pharmacy Consult for Heparin Indication: pulmonary embolus  Allergies  Allergen Reactions  . Chocolate Hives  . Sulfur Rash    Patient Measurements:   Heparin Dosing Weight:  68 kg  Vital Signs: Temp: 97.8 F (36.6 C) (04/23 2213) Temp Source: Rectal (04/23 2213) BP: 105/53 (04/24 0700) Pulse Rate: 72 (04/24 0715)  Labs: Recent Labs    08/03/19 2223 08/03/19 2314 08/04/19 0322 08/04/19 0610  HGB 8.5* 11.9*  --   --   HCT 29.2* 35.0*  --   --   PLT 429*  --   --   --   APTT 57*  --   --   --   LABPROT 21.4*  --   --   --   INR 1.9*  --   --   --   HEPARINUNFRC  --   --   --  >2.20*  CREATININE 1.37*  --   --   --   CKTOTAL 84  --   --   --   TROPONINIHS 16  --  21*  --     CrCl cannot be calculated (Unknown ideal weight.).   Medical History: Past Medical History:  Diagnosis Date  . Bilateral carotid artery disease ([place])   . Cardiomyopathy ([place]) 05/2019  . CHF (congestive heart failure) ([place])   . Critical lower limb ischemia   . DVT (deep venous thrombosis) ([place])   . Dyspnea on exertion 05/30/2019  . Hyperlipidemia    takes Atorvastatin daily  . Hypertension    takes Amlodipine and Ramipril daily  . Joint swelling   . Nocturia   . Peripheral arterial disease ([place])   . Pulmonary embolism ([place])    Refer to VQ scan.     Medications:  No current facility-administered medications on file prior to encounter.   Current Outpatient Medications on File Prior to Encounter  Medication Sig Dispense Refill  . acetaminophen (TYLENOL) 500 MG tablet Take 1,500 mg by mouth 2 (two) times daily as needed for headache.     . albuterol (VENTOLIN HFA) 108 (90 Base) MCG/ACT inhaler Inhale 2 puffs into the lungs every 4 (four) hours as needed for wheezing or shortness of breath.     Marland Kitchen amLODipine (NORVASC) 5 MG tablet Take 5 mg by mouth daily.    Marland Kitchen Apixaban Starter Pack (ELIQUIS STARTER PACK) 5 MG TBPK Take as directed on  package: start with two-5mg  tablets twice daily for 7 days. On day 8, switch to one-5mg  tablet twice daily. 1 each 0  . aspirin 81 MG chewable tablet Chew 81 mg by mouth daily.     Marland Kitchen atorvastatin (LIPITOR) 20 MG tablet Take 1 tablet (20 mg total) by mouth daily. 90 tablet 3  . furosemide (LASIX) 20 MG tablet Take 1 tablet (20 mg total) by mouth daily. 90 tablet 1  . metolazone (ZAROXOLYN) 2.5 MG tablet TAKE 1 TABLET BY MOUTH EVERY MONDAY, WEDNESDAY, AND FRIDAY. (Patient taking differently: Take 2.5 mg by mouth every Monday, Wednesday, and Friday. ) 12 tablet 0  . potassium chloride (KLOR-CON) 10 MEQ tablet Take 4 tablets (40 mEq total) by mouth daily for 4 days. (Patient not taking: Reported on 08/03/2019) 16 tablet 0  . potassium chloride SA (KLOR-CON) 20 MEQ tablet Take 1 tablet (20 mEq total) by mouth daily. Patient may request a liquid formulation. 30 tablet 0  . ramipril (ALTACE) 10 MG capsule TAKE 1 CAPSULE BY MOUTH AT BEDTIME (Patient taking  differently: Take 10 mg by mouth at bedtime. ) 30 capsule 3     Assessment: 79 y.o. male with PE, h/o DVT/PE and Eliquis now on hold hold, for heparin.  Baseline Heparin level elevated, indicating pt has taken Eliquis recently.    Goal of Therapy:  APTT 66-102 sec Heparin level 0.3-0.7 units/ml Monitor platelets by anticoagulation protocol: Yes   Plan:  Start heparin 1000 units/hr APTT in 8 hours   Taheerah Guldin, Gary Fleet 08/04/2019,7:27 AM
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
## 2019-08-04 NOTE — ED Notes (Addendum)
RN attempted to in and out cath, unable to pass catheter. External cath placed back on patient.

## 2019-08-04 NOTE — Progress Notes (Signed)
ANTICOAGULATION CONSULT NOTE - Follow Up Consult  Pharmacy Consult for Heparin Indication: pulmonary embolus  Allergies  Allergen Reactions  . Chocolate Hives  . Sulfur Rash    Patient Measurements: Height: 5\' 8"  (172.7 cm) Weight: 52.4 kg (115 lb 8.3 oz) IBW/kg (Calculated) : 68.4 Heparin Dosing Weight:  68 kg  Vital Signs: Temp: 96.8 F (36 C) (04/24 1640) Temp Source: Rectal (04/24 1640) BP: 141/51 (04/24 1640) Pulse Rate: 79 (04/24 1640)  Labs: Recent Labs    08/03/19 2223 08/03/19 2314 08/04/19 0322 08/04/19 0610 08/04/19 0803 08/04/19 1612  HGB 8.5* 11.9*  --   --   --   --   HCT 29.2* 35.0*  --   --   --   --   PLT 429*  --   --   --   --   --   APTT 57*  --   --   --   --  99*  LABPROT 21.4*  --   --   --   --   --   INR 1.9*  --   --   --   --   --   HEPARINUNFRC  --   --   --  >2.20*  --   --   CREATININE 1.37*  --   --   --   --   --   CKTOTAL 84  --   --   --   --   --   TROPONINIHS 16  --  21*  --  22*  --     Estimated Creatinine Clearance: 32.9 mL/min (A) (by C-G formula based on SCr of 1.37 mg/dL (H)).   Medical History: Past Medical History:  Diagnosis Date  . Bilateral carotid artery disease (HCC)   . Cardiomyopathy (HCC) 05/2019  . CHF (congestive heart failure) (HCC)   . Critical lower limb ischemia   . DVT (deep venous thrombosis) (HCC)   . Dyspnea on exertion 05/30/2019  . Hyperlipidemia    takes Atorvastatin daily  . Hypertension    takes Amlodipine and Ramipril daily  . Joint swelling   . Nocturia   . Peripheral arterial disease (HCC)   . Pulmonary embolism (HCC)    Refer to VQ scan.     Medications:  No current facility-administered medications on file prior to encounter.   Current Outpatient Medications on File Prior to Encounter  Medication Sig Dispense Refill  . acetaminophen (TYLENOL) 500 MG tablet Take 1,500 mg by mouth 2 (two) times daily as needed for headache.     . albuterol (VENTOLIN HFA) 108 (90 Base) MCG/ACT  inhaler Inhale 2 puffs into the lungs every 4 (four) hours as needed for wheezing or shortness of breath.     06/01/2019 amLODipine (NORVASC) 5 MG tablet Take 5 mg by mouth daily.    Marland Kitchen atorvastatin (LIPITOR) 20 MG tablet Take 1 tablet (20 mg total) by mouth daily. 90 tablet 3  . furosemide (LASIX) 20 MG tablet Take 1 tablet (20 mg total) by mouth daily. 90 tablet 1  . metolazone (ZAROXOLYN) 2.5 MG tablet TAKE 1 TABLET BY MOUTH EVERY MONDAY, WEDNESDAY, AND FRIDAY. (Patient taking differently: Take 2.5 mg by mouth every Monday, Wednesday, and Friday. ) 12 tablet 0  . potassium chloride SA (KLOR-CON) 20 MEQ tablet Take 1 tablet (20 mEq total) by mouth daily. Patient may request a liquid formulation. 30 tablet 0  . ramipril (ALTACE) 10 MG capsule TAKE 1 CAPSULE BY MOUTH AT BEDTIME (  Patient taking differently: Take 10 mg by mouth at bedtime. ) 30 capsule 3  . Apixaban Starter Pack (ELIQUIS STARTER PACK) 5 MG TBPK Take as directed on package: start with two-5mg  tablets twice daily for 7 days. On day 8, switch to one-5mg  tablet twice daily. 1 each 0  . aspirin 81 MG chewable tablet Chew 81 mg by mouth daily.     . potassium chloride (KLOR-CON) 10 MEQ tablet Take 4 tablets (40 mEq total) by mouth daily for 4 days. (Patient not taking: Reported on 08/03/2019) 16 tablet 0     Assessment: 79 y.o. male with PE, h/o DVT/PE and on Eliquis PTA  now on hold hold.   Started heparin for new BL PE.  Baseline Heparin level elevated, indicating pt has taken Eliquis recently.  H/h stable on admit will recheck in am - no bleeding noted  Heparin drip 1000 uts/hr aptt 99sec  Will use aptt to dose heparin for now while HL affected by apixaban   Goal of Therapy:  APTT 66-102 sec Heparin level 0.3-0.7 units/ml Monitor platelets by anticoagulation protocol: Yes   Plan:  Continue heparin 1000 units/hr APTT, HL and CBC daily  Bonnita Nasuti Pharm.D. CPP, BCPS Clinical Pharmacist [phone] 08/04/2019 5:12 PM      Nadine Counts 08/04/2019,5:08 PM

## 2019-08-04 NOTE — Progress Notes (Signed)
ABG attempted x 2.  Unable to obtain sample.  Suggest VBG when other labs are drawn to prevent excessive needle sticks.  Pt not in distress and responds to pain.

## 2019-08-05 DIAGNOSIS — J982 Interstitial emphysema: Secondary | ICD-10-CM

## 2019-08-05 DIAGNOSIS — J984 Other disorders of lung: Secondary | ICD-10-CM

## 2019-08-05 LAB — URINE CULTURE: Culture: NO GROWTH

## 2019-08-05 LAB — HEPARIN LEVEL (UNFRACTIONATED)
Heparin Unfractionated: 1.36 IU/mL — ABNORMAL HIGH (ref 0.30–0.70)
Heparin Unfractionated: 0.96 IU/mL — ABNORMAL HIGH (ref 0.30–0.70)

## 2019-08-05 LAB — BASIC METABOLIC PANEL
Anion gap: 11 (ref 5–15)
BUN: 78 mg/dL — ABNORMAL HIGH (ref 8–23)
CO2: 28 mmol/L (ref 22–32)
Calcium: 8.8 mg/dL — ABNORMAL LOW (ref 8.9–10.3)
Chloride: 101 mmol/L (ref 98–111)
Creatinine, Ser: 1.88 mg/dL — ABNORMAL HIGH (ref 0.61–1.24)
GFR calc Af Amer: 39 mL/min — ABNORMAL LOW (ref >60)
GFR calc non Af Amer: 33 mL/min — ABNORMAL LOW (ref >60)
Glucose, Bld: 84 mg/dL (ref 70–99)
Potassium: 3.4 mmol/L — ABNORMAL LOW (ref 3.5–5.1)
Sodium: 140 mmol/L (ref 135–145)

## 2019-08-05 LAB — CBC
HCT: 30.9 % — ABNORMAL LOW (ref 39.0–52.0)
Hemoglobin: 9.3 g/dL — ABNORMAL LOW (ref 13.0–17.0)
MCH: 27.6 pg (ref 26.0–34.0)
MCHC: 30.1 g/dL (ref 30.0–36.0)
MCV: 91.7 fL (ref 80.0–100.0)
Platelets: 386 10*3/uL (ref 150–400)
RBC: 3.37 MIL/uL — ABNORMAL LOW (ref 4.22–5.81)
RDW: 15.9 % — ABNORMAL HIGH (ref 11.5–15.5)
WBC: 11.1 10*3/uL — ABNORMAL HIGH (ref 4.0–10.5)
nRBC: 0 % (ref 0.0–0.2)

## 2019-08-05 LAB — CALCIUM, IONIZED: Calcium, Ionized, Serum: 5 mg/dL (ref 4.5–5.6)

## 2019-08-05 LAB — APTT
aPTT: 146 seconds — ABNORMAL HIGH (ref 24–36)
aPTT: 68 seconds — ABNORMAL HIGH (ref 24–36)

## 2019-08-05 LAB — PROCALCITONIN: Procalcitonin: 0.28 ng/mL

## 2019-08-05 MED ORDER — POTASSIUM CHLORIDE CRYS ER 20 MEQ PO TBCR
40.0000 meq | EXTENDED_RELEASE_TABLET | Freq: Two times a day (BID) | ORAL | Status: AC
  Administered 2019-08-05 (×2): 40 meq via ORAL
  Filled 2019-08-05 (×2): qty 2

## 2019-08-05 MED ORDER — FERROUS SULFATE 325 (65 FE) MG PO TABS
325.0000 mg | ORAL_TABLET | Freq: Every day | ORAL | Status: DC
  Administered 2019-08-07: 325 mg via ORAL
  Filled 2019-08-05 (×2): qty 1

## 2019-08-05 MED ORDER — HEPARIN (PORCINE) 25000 UT/250ML-% IV SOLN
750.0000 [IU]/h | INTRAVENOUS | Status: DC
  Administered 2019-08-05: 850 [IU]/h via INTRAVENOUS
  Filled 2019-08-05: qty 250

## 2019-08-05 MED ORDER — FOLIC ACID 1 MG PO TABS
1.0000 mg | ORAL_TABLET | Freq: Every day | ORAL | Status: DC
  Administered 2019-08-05 – 2019-08-08 (×3): 1 mg via ORAL
  Filled 2019-08-05 (×3): qty 1

## 2019-08-05 MED ORDER — LACTATED RINGERS IV SOLN: INTRAVENOUS | Status: DC

## 2019-08-05 NOTE — Evaluation (Signed)
Occupational Therapy Evaluation Patient Details Name: Kevin Reilly MRN: [id number] DOB: 07-Nov-1940 Today's Date: 08/05/2019    History of Present Illness Pt is a 79 yo male s/p AMS, found down by caregiver. CT scan of the C spine showed superior pneumomediastinum.  CT scan of the chest showed bilateral pulmonary embolism, left upper lobe cavitary lesion. pMHx: CAD, CHF, PVD, HTN.   Clinical Impression   Pt PTA: living at home alone with caregiver coming 2-3 hours/day for ADL and IADL needs. Pt reports performing most tasks with caregiver present. Pt currently limited by poor cognitive deficits, decreased strength, decreased ability to care for self and decreased aware of safetly. Pt unsafely sitting at EOB without staff present attempting to get OOB upon arrival. Pt modA overall for mobility with RW for stability. Pt maxA overall for ADl tasks. Pt set-upA for feeding in sitting. Pt would greatly benefit from continued OT skilled services in SNF setting. OT following acutely.     Follow Up Recommendations  SNF;Supervision/Assistance - 24 hour    Equipment Recommendations  Other (comment)(defer to next setting)    Recommendations for Other Services       Precautions / Restrictions Precautions Precautions: Fall Restrictions Weight Bearing Restrictions: No      Mobility Bed Mobility               General bed mobility comments: seated EOB upon my entry  Transfers Overall transfer level: Needs assistance Equipment used: Rolling walker (2 wheeled) Transfers: Sit to/from Stand Sit to Stand: Mod assist;+2 safety/equipment         General transfer comment: lifting help from EOB and from toilet in bathroom    Balance Overall balance assessment: Needs assistance Sitting-balance support: Feet supported Sitting balance-Leahy Scale: Fair Sitting balance - Comments: seated EOB upon my entry   Standing balance support: Bilateral upper extremity supported Standing  balance-Leahy Scale: Poor Standing balance comment: Pt holding onto RW and requiring assist to perform hygiene in standing as pt performing task in sitting after set-upA.                           ADL either performed or assessed with clinical judgement   ADL Overall ADL's : Needs assistance/impaired Eating/Feeding: Set up;Sitting Eating/Feeding Details (indicate cue type and reason): Pt requiring assist to open lids Grooming: Moderate assistance;Sitting;Standing   Upper Body Bathing: Moderate assistance;Sitting   Lower Body Bathing: Maximal assistance;Sitting/lateral leans;Sit to/from stand;Cueing for safety;Cueing for sequencing   Upper Body Dressing : Moderate assistance;Sitting;Standing   Lower Body Dressing: Maximal assistance;Sitting/lateral leans;Sit to/from stand   Toilet Transfer: Moderate assistance;Stand-pivot;Ambulation;RW;BSC   Toileting- Clothing Manipulation and Hygiene: Moderate assistance;Sitting/lateral lean;Sit to/from stand Toileting - Clothing Manipulation Details (indicate cue type and reason): Pt modA overall, able to wash part of front in sitting, but requires assist due to poor balance in standing at commode     Functional mobility during ADLs: Moderate assistance;Cueing for safety;Cueing for sequencing;Rolling walker General ADL Comments: Pt limited by poor cognitive deficits, decreased strength, decreased ability to care for self and decreased aware of safetly. Pt unsafely sitting at EOB withotu staff present attempting to get OOB upon arrival.     Vision Baseline Vision/History: Wears glasses Wears Glasses: At all times Patient Visual Report: No change from baseline Vision Assessment?: No apparent visual deficits     Perception     Praxis      Pertinent Vitals/Pain Pain Assessment: Faces Faces Pain Scale: Hurts  even more Pain Location: L hand with RN starting new IV Pain Descriptors / Indicators: Grimacing Pain Intervention(s):  Monitored during session     Hand Dominance Right   Extremity/Trunk Assessment Upper Extremity Assessment Upper Extremity Assessment: Generalized weakness   Lower Extremity Assessment Lower Extremity Assessment: Defer to PT evaluation;Generalized weakness   Cervical / Trunk Assessment Cervical / Trunk Assessment: Kyphotic   Communication Communication Communication: Other (comment)(limited responses)   Cognition Arousal/Alertness: Awake/alert Behavior During Therapy: Restless Overall Cognitive Status: Impaired/Different from baseline Area of Impairment: Attention;Memory;Safety/judgement;Problem solving;Orientation                 Orientation Level: Disoriented to;Time;Situation;Place Current Attention Level: Sustained Memory: Decreased short-term memory;Decreased recall of precautions   Safety/Judgement: Decreased awareness of safety;Decreased awareness of deficits   Problem Solving: Slow processing General Comments: patient sitting on EOB with IV out, blood on sheets, sitting on his telemetry cords with stool under him and telemetry leads pulled off; RN informed and assisted to clean and get new linen   General Comments  Pt's friend in room and perplexed as to how pt "got off this bad without [place] knowing," VSS.     Exercises     Shoulder Instructions      Home Living Family/patient expects to be discharged to:: Skilled nursing facility Living Arrangements: Alone;Non-relatives/Friends(caregiver) Available Help at Discharge: Available PRN/intermittently Type of Home: House                       Home Equipment: Kasandra Knudsen - single point          Prior Functioning/Environment Level of Independence: Needs assistance  Gait / Transfers Assistance Needed: reports was walking no devices at home, but indicated has had other falls ADL's / Homemaking Assistance Needed: assist for bathing, meals, transportation   Comments: limited information from patient due to  confusion and per RN depression        OT Problem List: Decreased strength;Decreased activity tolerance;Impaired balance (sitting and/or standing);Decreased safety awareness;Decreased cognition;Decreased range of motion;Decreased knowledge of use of DME or AE;Impaired UE functional use;Pain      OT Treatment/Interventions: Self-care/ADL training;Therapeutic exercise;Energy conservation;DME and/or AE instruction;Therapeutic activities;Cognitive remediation/compensation;Patient/family education;Balance training    OT Goals(Current goals can be found in the care plan section) Acute Rehab OT Goals Patient Stated Goal: none stated OT Goal Formulation: With patient Time For Goal Achievement: 08/19/19 Potential to Achieve Goals: Good ADL Goals Pt Will Perform Grooming: with min guard assist;standing Pt Will Perform Lower Body Dressing: with min guard assist;sitting/lateral leans;sit to/from stand Pt/caregiver will Perform Home Exercise Program: Increased strength;Both right and left upper extremity;With Supervision Additional ADL Goal #1: Pt will verbalize 3 strategies to reduce risk of falls at home. Additional ADL Goal #2: Pt will increase to tolerating x10 mins of ADL tasks with 2 seated rest breaks in order to increase activity tolerance.  OT Frequency: Min 2X/week   Barriers to D/C: Decreased caregiver support  pt now requires near 24/7 at home       Co-evaluation PT/OT/SLP Co-Evaluation/Treatment: Yes Reason for Co-Treatment: Complexity of the patient's impairments (multi-system involvement);For patient/therapist safety PT goals addressed during session: Mobility/safety with mobility;Balance;Proper use of DME OT goals addressed during session: ADL's and self-care      AM-PAC OT "6 Clicks" Daily Activity     Outcome Measure Help from another person eating meals?: A Little Help from another person taking care of personal grooming?: A Little Help from another person toileting,  which  includes using toliet, bedpan, or urinal?: A Lot Help from another person bathing (including washing, rinsing, drying)?: A Lot Help from another person to put on and taking off regular upper body clothing?: A Lot Help from another person to put on and taking off regular lower body clothing?: A Lot 6 Click Score: 14   End of Session Equipment Utilized During Treatment: Gait belt;Rolling walker Nurse Communication: Mobility status  Activity Tolerance: Patient tolerated treatment well;Patient limited by fatigue Patient left: in chair;with call bell/phone within reach;with chair alarm set;with nursing/sitter in room  OT Visit Diagnosis: Unsteadiness on feet (R26.81);Muscle weakness (generalized) (M62.81);Pain;Other symptoms and signs involving cognitive function Pain - part of body: (back)                Time: 1278-7183 OT Time Calculation (min): 33 min Charges:  OT General Charges $OT Visit: 1 Visit OT Evaluation $OT Eval Moderate Complexity: 1 Mod  Flora Lipps, OTR/L Acute Rehabilitation Services Pager: [phone] Office: [phone]   Flora Lipps 08/05/2019, 4:25 PM

## 2019-08-05 NOTE — Progress Notes (Signed)
   Palliative Medicine Inpatient Follow Up Note   HPI: Per intake H&P --> Kevin Reilly Havisis a 79 y.o.malewith medical history significant ofbilateral carotid artery disease, CHF, PAD, DVT, hypertension, hyperlipidemia, history of PE on Eliquis presenting via EMS for evaluation of altered mental status. It is reported that the patient lives alone and has a caregiver who checks on him. Caregiver found him lying on the floor and acting abnormally. Patient was slow to respond. Upon EMS arrival, heart rate 120 and blood pressure 104/70.  Palliative care was asked to consult on Omarii given his severe malnutrition and poor overall clinical status.  Today's Discussion (08/05/2019): Chart reviewed. Patient remains somnolent and withdrawn upon examination. His brother, Wynetta Emery and I met at bedside this morning. I shared my concerns about Nhan's fragile state. We reviewed his chronic conditions inclusive of CHF, PAD, and his more acute conditions inclusive of bilateral PE's and cavitary lung lesions. We reviewed that Lenis has lost a tremendous amount of weight and is severely malnourished. I shared with [name] and his family that I do not feel he would be safe to go home again. We discussed various options moving forward inclusive of continuing his present clinical course to observe for improvements or focusing on comfort and the quality of time [name] has left. He shared that he would like to continue doing what is presently being done for another day or two. If no improvements are made plan to discuss comfort oriented care and hospice in greater detail.  Discussed the importance of continued conversation with family and their  medical providers regarding overall plan of care and treatment options, ensuring decisions are within the context of the patients values and GOCs.  Questions and concerns addressed   Vital Signs Vitals:   08/05/19 0400 08/05/19 0833  BP:  (!) 122/51  Pulse: 76 (!) 108  Resp:  14 18  Temp:  97.9 F (36.6 C)  SpO2: (!) 35% 100%    Intake/Output Summary (Last 24 hours) at 08/05/2019 1036 Last data filed at 08/05/2019 0820 Gross per 24 hour  Intake 2733.73 ml  Output 550 ml  Net 2183.73 ml   Last Weight  Most recent update: 08/04/2019  3:14 PM   Weight  52.4 kg (115 lb 8.3 oz)           Recommendations and Plan: DNAR/DNI, no feeding tubes, no HD  Continue current course  TOC --> SW to look into, patient is clearly unable to care for himself does not have good social support  24-48 hour timed trial to observe for improvements  Chaplain consult  Time Spent: 60 Greater than 50% of the time was spent in counseling and coordination of care ______________________________________________________________________________________ [place] Team Team Cell Phone: [phone] Please utilize secure chat with additional questions, if there is no response within 30 minutes please call the above phone number  Palliative Medicine Team providers are available by phone from 7am to 7pm daily and can be reached through the team cell phone.  Should this patient require assistance outside of these hours, please call the patient's attending physician.

## 2019-08-05 NOTE — Progress Notes (Addendum)
ANTICOAGULATION CONSULT NOTE - Follow Up Consult  Pharmacy Consult for Heparin Indication: pulmonary embolus  Allergies  Allergen Reactions  . Chocolate Hives  . Sulfur Rash    Patient Measurements: Height: 5\' 8"  (172.7 cm) Weight: 52.4 kg (115 lb 8.3 oz) IBW/kg (Calculated) : 68.4 Heparin Dosing Weight:  68 kg  Vital Signs: Temp: 97.7 F (36.5 C) (04/25 0300) Temp Source: Axillary (04/25 0300) BP: 132/82 (04/25 0300) Pulse Rate: 61 (04/25 0300)  Labs: Recent Labs    08/03/19 2223 08/03/19 2223 08/03/19 2314 08/04/19 0322 08/04/19 0610 08/04/19 0803 08/04/19 1612 08/05/19 0245  HGB 8.5*   < > 11.9*  --   --   --   --  9.3*  HCT 29.2*  --  35.0*  --   --   --   --  30.9*  PLT 429*  --   --   --   --   --   --  386  APTT 57*  --   --   --   --   --  99* 146*  LABPROT 21.4*  --   --   --   --   --   --   --   INR 1.9*  --   --   --   --   --   --   --   HEPARINUNFRC  --   --   --   --  >2.20*  --   --  1.36*  CREATININE 1.37*  --   --   --   --   --   --  1.88*  CKTOTAL 84  --   --   --   --   --   --   --   TROPONINIHS 16  --   --  21*  --  22*  --   --    < > = values in this interval not displayed.    Estimated Creatinine Clearance: 24 mL/min (A) (by C-G formula based on SCr of 1.88 mg/dL (H)).   Medical History: Past Medical History:  Diagnosis Date  . Bilateral carotid artery disease ([place])   . Cardiomyopathy ([place]) 05/2019  . CHF (congestive heart failure) ([place])   . Critical lower limb ischemia   . DVT (deep venous thrombosis) ([place])   . Dyspnea on exertion 05/30/2019  . Hyperlipidemia    takes Atorvastatin daily  . Hypertension    takes Amlodipine and Ramipril daily  . Joint swelling   . Nocturia   . Peripheral arterial disease ([place])   . Pulmonary embolism ([place])    Refer to VQ scan.     Medications:  No current facility-administered medications on file prior to encounter.   Current Outpatient Medications on File Prior to Encounter  Medication  Sig Dispense Refill  . acetaminophen (TYLENOL) 500 MG tablet Take 1,500 mg by mouth 2 (two) times daily as needed for headache.     . albuterol (VENTOLIN HFA) 108 (90 Base) MCG/ACT inhaler Inhale 2 puffs into the lungs every 4 (four) hours as needed for wheezing or shortness of breath.     Marland Kitchen amLODipine (NORVASC) 5 MG tablet Take 5 mg by mouth daily.    Marland Kitchen atorvastatin (LIPITOR) 20 MG tablet Take 1 tablet (20 mg total) by mouth daily. 90 tablet 3  . furosemide (LASIX) 20 MG tablet Take 1 tablet (20 mg total) by mouth daily. 90 tablet 1  . metolazone (ZAROXOLYN) 2.5 MG tablet TAKE 1 TABLET BY  MOUTH EVERY MONDAY, WEDNESDAY, AND FRIDAY. (Patient taking differently: Take 2.5 mg by mouth every Monday, Wednesday, and Friday. ) 12 tablet 0  . potassium chloride SA (KLOR-CON) 20 MEQ tablet Take 1 tablet (20 mEq total) by mouth daily. Patient may request a liquid formulation. 30 tablet 0  . ramipril (ALTACE) 10 MG capsule TAKE 1 CAPSULE BY MOUTH AT BEDTIME (Patient taking differently: Take 10 mg by mouth at bedtime. ) 30 capsule 3  . Apixaban Starter Pack (ELIQUIS STARTER PACK) 5 MG TBPK Take as directed on package: start with two-5mg  tablets twice daily for 7 days. On day 8, switch to one-5mg  tablet twice daily. 1 each 0  . aspirin 81 MG chewable tablet Chew 81 mg by mouth daily.     . potassium chloride (KLOR-CON) 10 MEQ tablet Take 4 tablets (40 mEq total) by mouth daily for 4 days. (Patient not taking: Reported on 08/03/2019) 16 tablet 0     Assessment: 79 y.o. male with PE, h/o DVT/PE and on Eliquis PTA  now on hold hold.   Started heparin for new BL PE.  Baseline Heparin level elevated, indicating pt has taken Eliquis recently.  H/h stable on admit will recheck in am - no bleeding noted  Heparin drip 1000 uts/hr aptt 99sec  Will use aptt to dose heparin for now while HL affected by apixaban   4/25 AM update:  APTT and HL elevated No issues per RN  Goal of Therapy:  APTT 66-102 sec Heparin level  0.3-0.7 units/ml Monitor platelets by anticoagulation protocol: Yes   Plan:  Hold heparin x 1 hr Re-start heparin drip at 850 units/hr at 0530 1400 heparin level and aPTT  Abran Duke, PharmD, BCPS Clinical Pharmacist Phone: [phone]

## 2019-08-05 NOTE — Progress Notes (Signed)
ANTICOAGULATION CONSULT NOTE - Follow Up Consult  Pharmacy Consult for Heparin Indication: pulmonary embolus  Allergies  Allergen Reactions  . Chocolate Hives  . Sulfur Rash    Patient Measurements: Height: 5\' 8"  (172.7 cm) Weight: 52.4 kg (115 lb 8.3 oz) IBW/kg (Calculated) : 68.4 Heparin Dosing Weight:  68 kg  Vital Signs: Temp: 97.7 F (36.5 C) (04/25 1226) Temp Source: Oral (04/25 1226) BP: 108/43 (04/25 1226) Pulse Rate: 45 (04/25 1226)  Labs: Recent Labs    08/03/19 2223 08/03/19 2223 08/03/19 2314 08/04/19 0322 08/04/19 0610 08/04/19 0803 08/04/19 1612 08/05/19 0245 08/05/19 1347  HGB 8.5*   < > 11.9*  --   --   --   --  9.3*  --   HCT 29.2*  --  35.0*  --   --   --   --  30.9*  --   PLT 429*  --   --   --   --   --   --  386  --   APTT 57*   < >  --   --   --   --  99* 146* 68*  LABPROT 21.4*  --   --   --   --   --   --   --   --   INR 1.9*  --   --   --   --   --   --   --   --   HEPARINUNFRC  --   --   --   --  >2.20*  --   --  1.36*  --   CREATININE 1.37*  --   --   --   --   --   --  1.88*  --   CKTOTAL 84  --   --   --   --   --   --   --   --   TROPONINIHS 16  --   --  21*  --  22*  --   --   --    < > = values in this interval not displayed.    Estimated Creatinine Clearance: 24 mL/min (A) (by C-G formula based on SCr of 1.88 mg/dL (H)).   Medical History: Past Medical History:  Diagnosis Date  . Bilateral carotid artery disease (HCC)   . Cardiomyopathy (HCC) 05/2019  . CHF (congestive heart failure) (HCC)   . Critical lower limb ischemia   . DVT (deep venous thrombosis) (HCC)   . Dyspnea on exertion 05/30/2019  . Hyperlipidemia    takes Atorvastatin daily  . Hypertension    takes Amlodipine and Ramipril daily  . Joint swelling   . Nocturia   . Peripheral arterial disease (HCC)   . Pulmonary embolism (HCC)    Refer to VQ scan.     Medications:  No current facility-administered medications on file prior to encounter.   Current  Outpatient Medications on File Prior to Encounter  Medication Sig Dispense Refill  . acetaminophen (TYLENOL) 500 MG tablet Take 1,500 mg by mouth 2 (two) times daily as needed for headache.     . albuterol (VENTOLIN HFA) 108 (90 Base) MCG/ACT inhaler Inhale 2 puffs into the lungs every 4 (four) hours as needed for wheezing or shortness of breath.     06/01/2019 amLODipine (NORVASC) 5 MG tablet Take 5 mg by mouth daily.    Marland Kitchen atorvastatin (LIPITOR) 20 MG tablet Take 1 tablet (20 mg total) by mouth daily. 90 tablet  3  . furosemide (LASIX) 20 MG tablet Take 1 tablet (20 mg total) by mouth daily. 90 tablet 1  . metolazone (ZAROXOLYN) 2.5 MG tablet TAKE 1 TABLET BY MOUTH EVERY MONDAY, WEDNESDAY, AND FRIDAY. (Patient taking differently: Take 2.5 mg by mouth every Monday, Wednesday, and Friday. ) 12 tablet 0  . potassium chloride SA (KLOR-CON) 20 MEQ tablet Take 1 tablet (20 mEq total) by mouth daily. Patient may request a liquid formulation. 30 tablet 0  . ramipril (ALTACE) 10 MG capsule TAKE 1 CAPSULE BY MOUTH AT BEDTIME (Patient taking differently: Take 10 mg by mouth at bedtime. ) 30 capsule 3  . Apixaban Starter Pack (ELIQUIS STARTER PACK) 5 MG TBPK Take as directed on package: start with two-5mg  tablets twice daily for 7 days. On day 8, switch to one-5mg  tablet twice daily. 1 each 0  . aspirin 81 MG chewable tablet Chew 81 mg by mouth daily.     . potassium chloride (KLOR-CON) 10 MEQ tablet Take 4 tablets (40 mEq total) by mouth daily for 4 days. (Patient not taking: Reported on 08/03/2019) 16 tablet 0     Assessment: 79 y.o. male with PE, h/o DVT/PE and on Eliquis PTA now on hold hold. Started heparin for new bilateral PE (R>L). Baseline heparin level was elevated, indicating pt has taken Eliquis recently.   Heparin level remains falsely elevated from recent Eliquis at 0.96 aPTT is within goal at 68 sec Will use aPTT to dose heparin for now while heparin level affected by apixaban   Hgb stable 9.3, pltc  wnl - no bleeding noted  Goal of Therapy:  APTT 66-102 sec Heparin level 0.3-0.7 units/ml Monitor platelets by anticoagulation protocol: Yes   Plan:  Continue heparin drip at 850 units/hr Daily heparin level and aPTT until levels correlate Monitor for bleeding  Vertis Kelch, PharmD, [facility] PGY2 Cardiology Pharmacy Resident Phone [phone] 08/05/2019       2:32 PM  Please check [email or web address] for unit-specific pharmacist phone numbers

## 2019-08-05 NOTE — Progress Notes (Signed)
Patient pulled PIV access twice and other medical monitor lines. Put on the mitts. Oral intake is poor even though his brother brought his upper denture. Asked patient to food is too hard to chew, he nodded his head. Asked puree food is better for him. He nodded his head again. Put on the order for puree diet for tomorrow. Patient had cash in his wallet. Sent it to safe. HS [employer]

## 2019-08-05 NOTE — Progress Notes (Signed)
PROGRESS NOTE  Kevin Reilly SWF:[id number] DOB: August 03, 1940 DOA: 08/03/2019 PCP: Patient, No Pcp Per  HPI/Recap of past 24 hours: Kevin Reilly is a 79 y.o. male with medical history significant of bilateral carotid artery disease, CHF, PAD, DVT/PE on Eliquis, hypertension, hyperlipidemia, presenting via EMS for evaluation of altered mental status.  Caregiver found him laying on the floor and confused after several days.  Had a fall last week.  Lives alone.  Has lost his wife, daughter and dog.  Poor social support.  ED Course:  SARS-CoV-2 PCR test negative.  CT head nonacute.  CT chest with contrast showing bilateral pulmonary embolism, large amount of pneumomediastinum, suspected cavitary pneumonia, cavitary neoplasm cannot be excluded.  TRH was asked to admit.  PCCM consulted due to pneumomediastinum and cavitary lesions on CT chest, following.  08/05/19: Seen and examined.  He denies any chest pain or dyspnea.  He is minimally interactive but responds to questions appropriately.    Assessment/Plan: Principal Problem:   Pneumomediastinum ([place]) Active Problems:   Pulmonary embolism (HCC)   Cavitary lesion of lung   Acute respiratory failure with hypoxemia (HCC)   Acute metabolic encephalopathy   Palliative care by specialist   Goals of care, counseling/discussion   DNR (do not resuscitate)   Large pneumomediastinum:  Possibly trauma related.   No concurrent pneumothorax Maintain O2 saturation greater than 92% PCCM following.    Bilateral pulmonary emboli:  Patient has a history of PE and is on Eliquis.   Could be due to Eliquis failure versus noncompliance.   On heparin drip   2D echo ordered and pending Maintain O2 saturation greater 92%  Cavitary lung lesions:  CT showing 9.2 cm x 4.4 cm x 4.2 cm lobulated, thick walled left upper lobe cavitary lesion.   CT also showing additional smaller right middle lobe cavitary lesion with a 9 mm noncalcified right lower lobe  lung nodule. Pulmonology feels cavitary lesions are possibly secondary to prior PE with infarction versus necrotizing infection, less likely TB. Continue Zosyn Procalcitonin 0.28 from 0.49 Blood cultures negative to date, continue to follow cultures Obtain sputum cultures Will need to repeat CT scan in 6 weeks to monitor findings noted above  AKI  Baseline creatinine appears to be 0.8 with GFR greater than 60 Presented with creatinine 1.3 Creatinine is trending up to 1.88 Avoid nephrotoxins Continue IV fluid hydration, change fluid to LR and DC bicarb drip Monitor urine output  Hypokalemia likely contributed by bicarb drip Potassium 3.4 Replete orally with KCl 40 mEq x 2 doses Repeat BMP in the morning  Resolved non AG metabolic acidosis on IV bicarb Presented with serum bicarb 13 Serum bicarb 28  Acute hypoxemic respiratory failure:  Continue supplemental oxygen Not on oxygen supplementation at baseline  Acute metabolic encephalopathy, improving:  Suspect related to acute infection/ dehydration.   No focal neuro deficit.   Head CT negative for acute intracranial abnormality.   Continue IV fluid hydration and antibiotic.  Anemia:  Hemoglobin 9.3 No overt bleeding Iron studies suggestive of iron deficiency and folate deficiency Start iron supplement and folate supplement Continue to monitor H&H    DVT prophylaxis: Heparin drip Code Status: Full code Family Communication: No family available at this time. Disposition Plan: Anticipate discharge after clinical improvement. Consults called: PCCM   Objective: Vitals:   08/04/19 2326 08/05/19 0000 08/05/19 0300 08/05/19 0400  BP: 140/61  132/82   Pulse: 81 (!) 125 61 76  Resp: 13 17 14  14  Temp: 97.7 F (36.5 C)  97.7 F (36.5 C)   TempSrc: Axillary  Axillary   SpO2: 100% (!) 70% 100% (!) 35%  Weight:      Height:        Intake/Output Summary (Last 24 hours) at 08/05/2019 0740 Last data filed at 08/05/2019  0021 Gross per 24 hour  Intake 1929 ml  Output 550 ml  Net 1379 ml   Filed Weights   08/04/19 1507  Weight: 52.4 kg    Exam:  . General: 79 y.o. year-old male well developed well nourished in no acute distress.  Alert and minimally interactive. . Cardiovascular: Regular rate and rhythm with no rubs or gallops.  No thyromegaly or JVD noted.   Marland Kitchen Respiratory: Mild diffuse rales bilaterally.  No wheezing noted.  Poor inspiratory effort.   . Abdomen: Soft nontender nondistended with normal bowel sounds x4 quadrants. . Musculoskeletal: No lower extremity edema bilaterally.   Marland Kitchen Psychiatry: Mood is appropriate for condition and setting   Data Reviewed: CBC: Recent Labs  Lab 08/03/19 2223 08/03/19 2314 08/05/19 0245  WBC 14.0*  --  11.1*  NEUTROABS 12.8*  --   --   HGB 8.5* 11.9* 9.3*  HCT 29.2* 35.0* 30.9*  MCV 96.7  --  91.7  PLT 429*  --  386   Basic Metabolic Panel: Recent Labs  Lab 08/03/19 2223 08/03/19 2314 08/04/19 0322 08/05/19 0245  NA 145 141  --  140  K 2.9* 4.2  --  3.4*  CL 124*  --   --  101  CO2 13*  --   --  28  GLUCOSE 85  --   --  84  BUN 65*  --   --  78*  CREATININE 1.37*  --   --  1.88*  CALCIUM 5.7*  --   --  8.8*  MG  --   --  2.3  --    GFR: Estimated Creatinine Clearance: 24 mL/min (A) (by C-G formula based on SCr of 1.88 mg/dL (H)). Liver Function Tests: Recent Labs  Lab 08/03/19 2223  AST 20  ALT 19  ALKPHOS 63  BILITOT 0.5  PROT 4.5*  ALBUMIN 1.1*   No results for input(s): LIPASE, AMYLASE in the last 168 hours. Recent Labs  Lab 08/03/19 2224  AMMONIA 37*   Coagulation Profile: Recent Labs  Lab 08/03/19 2223  INR 1.9*   Cardiac Enzymes: Recent Labs  Lab 08/03/19 2223  CKTOTAL 84   BNP (last 3 results) Recent Labs    06/05/19 1530 06/08/19 1439 06/21/19 1426  PROBNP 12,549* 16,303* 8,187*   HbA1C: No results for input(s): HGBA1C in the last 72 hours. CBG: No results for input(s): GLUCAP in the last 168  hours. Lipid Profile: No results for input(s): CHOL, HDL, LDLCALC, TRIG, CHOLHDL, LDLDIRECT in the last 72 hours. Thyroid Function Tests: No results for input(s): TSH, T4TOTAL, FREET4, T3FREE, THYROIDAB in the last 72 hours. Anemia Panel: Recent Labs    08/04/19 0803  VITAMINB12 528  FOLATE 3.7*  FERRITIN 383*  TIBC 162*  IRON 30*  RETICCTPCT 1.7   Urine analysis:    Component Value Date/Time   COLORURINE YELLOW 08/04/2019 1148   APPEARANCEUR CLEAR 08/04/2019 1148   LABSPEC 1.017 08/04/2019 1148   PHURINE 5.0 08/04/2019 1148   GLUCOSEU NEGATIVE 08/04/2019 1148   HGBUR NEGATIVE 08/04/2019 1148   BILIRUBINUR NEGATIVE 08/04/2019 1148   KETONESUR NEGATIVE 08/04/2019 1148   PROTEINUR NEGATIVE 08/04/2019 1148   UROBILINOGEN  0.2 02/21/2015 1554   NITRITE NEGATIVE 08/04/2019 1148   LEUKOCYTESUR NEGATIVE 08/04/2019 1148   Sepsis Labs: @LABRCNTIP (procalcitonin:4,lacticidven:4)  ) Recent Results (from the past 240 hour(s))  Blood Culture (routine x 2)     Status: None (Preliminary result)   Collection Time: 08/03/19 10:24 PM   Specimen: BLOOD  Result Value Ref Range Status   Specimen Description BLOOD RIGHT FOREARM  Final   Special Requests   Final    BOTTLES DRAWN AEROBIC AND ANAEROBIC Blood Culture results may not be optimal due to an inadequate volume of blood received in culture bottles   Culture   Final    NO GROWTH < 24 HOURS Performed at [name], [address], [place], [place]    Report Status PENDING  Incomplete  Blood Culture (routine x 2)     Status: None (Preliminary result)   Collection Time: 08/03/19 10:26 PM   Specimen: BLOOD  Result Value Ref Range Status   Specimen Description BLOOD LEFT ARM  Final   Special Requests   Final    BOTTLES DRAWN AEROBIC ONLY Blood Culture results may not be optimal due to an inadequate volume of blood received in culture bottles   Culture   Final    NO GROWTH < 24 HOURS Performed at [facility], [address] [place], [place]    Report Status PENDING  Incomplete  Respiratory Panel by RT PCR (Flu A&B, Covid) - Nasopharyngeal Swab     Status: None   Collection Time: 08/03/19 11:50 PM   Specimen: Nasopharyngeal Swab  Result Value Ref Range Status   SARS Coronavirus 2 by RT PCR NEGATIVE NEGATIVE Final    Comment: (NOTE) SARS-CoV-2 target nucleic acids are NOT DETECTED. The SARS-CoV-2 RNA is generally detectable in upper respiratoy specimens during the acute phase of infection. The lowest concentration of SARS-CoV-2 viral copies this assay can detect is 131 copies/mL. A negative result does not preclude SARS-Cov-2 infection and should not be used as the sole basis for treatment or other patient management decisions. A negative result may occur with  improper specimen collection/handling, submission of specimen other than nasopharyngeal swab, presence of viral mutation(s) within the areas targeted by this assay, and inadequate number of viral copies (<131 copies/mL). A negative result must be combined with clinical observations, patient history, and epidemiological information. The expected result is Negative. Fact Sheet for Patients:  08/05/19 Fact Sheet for Healthcare Providers:  https://www.moore.com/ This test is not yet ap proved or cleared by the https://www.young.biz/ FDA and  has been authorized for detection and/or diagnosis of SARS-CoV-2 by FDA under an Emergency Use Authorization (EUA). This EUA will remain  in effect (meaning this test can be used) for the duration of the COVID-19 declaration under Section 564(b)(1) of the Act, 21 U.S.C. section 360bbb-3(b)(1), unless the authorization is terminated or revoked sooner.    Influenza A by PCR NEGATIVE NEGATIVE Final   Influenza B by PCR NEGATIVE NEGATIVE Final    Comment: (NOTE) The Xpert Xpress SARS-CoV-2/FLU/RSV assay is intended as an aid in  the diagnosis of  influenza from Nasopharyngeal swab specimens and  should not be used as a sole basis for treatment. Nasal washings and  aspirates are unacceptable for Xpert Xpress SARS-CoV-2/FLU/RSV  testing. Fact Sheet for Patients: [place] Fact Sheet for Healthcare Providers: https://www.moore.com/ This test is not yet approved or cleared by the https://www.young.biz/ FDA and  has been authorized for detection and/or diagnosis  of SARS-CoV-2 by  FDA under an Emergency Use Authorization (EUA). This EUA will remain  in effect (meaning this test can be used) for the duration of the  Covid-19 declaration under Section 564(b)(1) of the Act, 21  U.S.C. section 360bbb-3(b)(1), unless the authorization is  terminated or revoked. Performed at [facility], [address] [place], [place] [address]   MRSA PCR Screening     Status: None   Collection Time: 08/04/19  3:21 PM   Specimen: Nasal Mucosa; Nasopharyngeal  Result Value Ref Range Status   MRSA by PCR NEGATIVE NEGATIVE Final    Comment:        The GeneXpert MRSA Assay (FDA approved for NASAL specimens only), is one component of a comprehensive MRSA colonization surveillance program. It is not intended to diagnose MRSA infection nor to guide or monitor treatment for MRSA infections. Performed at [facility], [address], [place], [place] [address]       Studies: No results found.  Scheduled Meds:  Continuous Infusions: . heparin 850 Units/hr (08/05/19 0540)  . piperacillin-tazobactam (ZOSYN)  IV 3.375 g (08/05/19 0021)  .  sodium bicarbonate (isotonic) infusion in sterile water 100 mL/hr at 08/05/19 0733     LOS: 1 day     Darlin Drop, MD [facility] Pager [phone]  If 7PM-7AM, please contact night-coverage [email or web address] Password [facility] 08/05/2019, 7:40 AM

## 2019-08-05 NOTE — Evaluation (Signed)
Physical Therapy Evaluation Patient Details Name: Kevin Reilly MRN: [id number] DOB: 01-18-1941 Today's Date: 08/05/2019   History of Present Illness  Pt is a 79 yo male s/p AMS, found down by caregiver. CT scan of the C spine showed superior pneumomediastinum.  CT scan of the chest showed bilateral pulmonary embolism, left upper lobe cavitary lesion. pMHx: CAD, CHF, PVD, HTN.  Clinical Impression  Patient presents with decreased mobility due to weakness, decreased balance, decreased activity tolerance, decreased cognition and recent falls at home with failure to thrive.  Currently min to mod A for ambulation to bathroom and max to total A for self care.  Patient not safe to live on his own and family may be looking into options for more help.  Currently patient appropriate for SNF level rehab.  PT to follow acutely.     Follow Up Recommendations SNF    Equipment Recommendations  Rolling walker with 5" wheels    Recommendations for Other Services       Precautions / Restrictions Precautions Precautions: Fall      Mobility  Bed Mobility               General bed mobility comments: seated EOB upon my entry  Transfers Overall transfer level: Needs assistance Equipment used: Rolling walker (2 wheeled) Transfers: Sit to/from Stand Sit to Stand: Mod assist;+2 safety/equipment         General transfer comment: lifting help from EOB and from toilet in bathroom  Ambulation/Gait Ambulation/Gait assistance: Min assist;Mod assist Gait Distance (Feet): 12 Feet(&16') Assistive device: Rolling walker (2 wheeled) Gait Pattern/deviations: Step-through pattern;Decreased stride length;Trunk flexed;Wide base of support     General Gait Details: walked to bathroom to wash up and then to recliner in the room.  LE flexion and trunk flexion throughout with RW for support and min to mod A for balance and walker management with cues for proximity  Stairs            Wheelchair  Mobility    Modified Rankin (Stroke Patients Only)       Balance Overall balance assessment: Needs assistance Sitting-balance support: Feet supported Sitting balance-Leahy Scale: Fair Sitting balance - Comments: seated EOB upon my entry   Standing balance support: Bilateral upper extremity supported Standing balance-Leahy Scale: Poor Standing balance comment: UE support needed, stood for hygiene after toileting, OT in room to assist                             Pertinent Vitals/Pain Pain Assessment: Faces Faces Pain Scale: Hurts even more Pain Location: L hand with RN starting new IV Pain Descriptors / Indicators: Grimacing Pain Intervention(s): Monitored during session;Utilized relaxation techniques    Home Living Family/patient expects to be discharged to:: Skilled nursing facility Living Arrangements: Alone;Non-relatives/Friends(caregiver) Available Help at Discharge: Available PRN/intermittently Type of Home: House         Home Equipment: Kasandra Knudsen - single point      Prior Function Level of Independence: Needs assistance   Gait / Transfers Assistance Needed: reports was walking no devices at home, but indicated has had other falls  ADL's / Homemaking Assistance Needed: assist for bathing, meals, transportation  Comments: limited information from patient due to confusion and per RN depression     Hand Dominance        Extremity/Trunk Assessment   Upper Extremity Assessment Upper Extremity Assessment: Defer to OT evaluation    Lower Extremity Assessment  Lower Extremity Assessment: Generalized weakness    Cervical / Trunk Assessment Cervical / Trunk Assessment: Kyphotic  Communication   Communication: Other (comment)(limited responses)  Cognition Arousal/Alertness: Awake/alert Behavior During Therapy: Restless Overall Cognitive Status: Impaired/Different from baseline Area of Impairment: Attention;Memory;Safety/judgement;Problem  solving;Orientation                 Orientation Level: Disoriented to;Time;Situation;Place Current Attention Level: Sustained Memory: Decreased short-term memory;Decreased recall of precautions   Safety/Judgement: Decreased awareness of safety;Decreased awareness of deficits   Problem Solving: Slow processing General Comments: patient sitting on EOB with IV out, blood on sheets, sitting on his telemetry cords with stool under him and telemetry leads pulled off; RN informed and assisted to clean and get new linen      General Comments General comments (skin integrity, edema, etc.): patient cachexic, pillows placed in recliner due to no cushion on sitting surface    Exercises     Assessment/Plan    PT Assessment Patient needs continued PT services  PT Problem List Decreased strength;Decreased activity tolerance;Decreased mobility;Decreased cognition;Decreased safety awareness;Decreased knowledge of use of DME;Decreased balance       PT Treatment Interventions DME instruction;Therapeutic activities;Balance training;Patient/family education;Therapeutic exercise;Functional mobility training;Gait training    PT Goals (Current goals can be found in the Care Plan section)  Acute Rehab PT Goals Patient Stated Goal: none stated PT Goal Formulation: Patient unable to participate in goal setting Time For Goal Achievement: 08/19/19 Potential to Achieve Goals: Fair    Frequency Min 3X/week   Barriers to discharge Decreased caregiver support      Co-evaluation PT/OT/SLP Co-Evaluation/Treatment: Yes Reason for Co-Treatment: Necessary to address cognition/behavior during functional activity;For patient/therapist safety;To address functional/ADL transfers PT goals addressed during session: Mobility/safety with mobility;Balance;Proper use of DME         AM-PAC PT "6 Clicks" Mobility  Outcome Measure Help needed turning from your back to your side while in a flat bed without  using bedrails?: A Little Help needed moving from lying on your back to sitting on the side of a flat bed without using bedrails?: A Little Help needed moving to and from a bed to a chair (including a wheelchair)?: A Lot Help needed standing up from a chair using your arms (e.g., wheelchair or bedside chair)?: A Lot Help needed to walk in hospital room?: A Lot Help needed climbing 3-5 steps with a railing? : A Lot 6 Click Score: 14    End of Session   Activity Tolerance: Patient limited by fatigue Patient left: in chair;with call bell/phone within reach;with chair alarm set;with nursing/sitter in room   PT Visit Diagnosis: Other abnormalities of gait and mobility (R26.89);History of falling (Z91.81);Other symptoms and signs involving the nervous system (R29.898)    Time: 6269-4854 PT Time Calculation (min) (ACUTE ONLY): 28 min   Charges:   PT Evaluation $PT Eval Moderate Complexity: 1 Mod          Sheran Lawless, [facility] [phone] 08/05/2019   Elray Mcgregor 08/05/2019, 3:43 PM

## 2019-08-05 NOTE — Plan of Care (Signed)
  Problem: Health Behavior/Discharge Planning: Goal: Ability to manage health-related needs will improve Outcome: Progressing   Problem: Clinical Measurements: Goal: Will remain free from infection Outcome: Progressing Goal: Diagnostic test results will improve Outcome: Progressing   Problem: Nutrition: Goal: Adequate nutrition will be maintained Outcome: Progressing   Problem: Elimination: Goal: Will not experience complications related to urinary retention Outcome: Progressing   Problem: Safety: Goal: Ability to remain free from injury will improve Outcome: Progressing

## 2019-08-05 NOTE — Progress Notes (Signed)
NAME:  Kevin Reilly, MRN:  [id number], DOB:  21-Oct-1940, LOS: 1 ADMISSION DATE:  08/03/2019, CONSULTATION DATE:  08/05/19 REFERRING MD:  EDP, CHIEF COMPLAINT:  AMS   Brief History   79 year old male with history of HFrEF, hypertension, DVT/PE who is supposed to be taking Eliquis who presented confused, was found on the floor by his caregiver.  Found to have pneumomediastinum and bilateral pulmonary emboli.  Stable on room air, PCCM asked to consult.   Past Medical History   has a past medical history of Bilateral carotid artery disease ([place]), Cardiomyopathy ([place]) (05/2019), CHF (congestive heart failure) ([place]), Critical lower limb ischemia, DVT (deep venous thrombosis) ([place]), Dyspnea on exertion (05/30/2019), Hyperlipidemia, Hypertension, Joint swelling, Nocturia, Peripheral arterial disease ([place]), and Pulmonary embolism ([place]).   Significant Hospital Events   08/05/19-admit to stepdown, PCCM consult   Consults:  PCCM  Procedures:    Significant Diagnostic Tests:  08/05/19 CT head>> lysed atrophy without acute intracranial abnormality 08/05/19 CT cervical spine>> multilevel degenerative changes 08/05/19 CT chest>> pneumomediastinum extending superiorly to involve the soft tissues posterior and lateral to the oropharynx, mild to moderate severity bilateral PE left upper lobe and right middle lobe cavitary lesions  Echo 05/2019 EF 22%, RV dilatation, global hypokinesis, RVSP 51  Micro Data:  08/05/19 RVP>>negative 08/05/19 BCx2>>ng 08/05/19 UC>>ng   Antimicrobials:    4/24 zosyn >>  Interim history/subjective:   Afebrile Denies chest pain or dyspnea Saturation 97% on room air   Objective   Blood pressure (!) 108/43, pulse (!) 45, temperature 97.7 F (36.5 C), temperature source Oral, resp. rate 19, height 5\' 8"  (1.727 m), weight 52.4 kg, SpO2 92 %.        Intake/Output Summary (Last 24 hours) at 08/05/2019 1641 Last data filed at 08/05/2019 1300 Gross per 24 hour    Intake 2149.38 ml  Output 1075 ml  Net 1074.38 ml   Filed Weights   08/04/19 1507  Weight: 52.4 kg    General: Elderly, thin poorly nourished male awake and in no distress, flat affect HEENT: MM pink/moist Neuro: Nonverbal, lethargic, moves all 4 extremities CV: s1s2 RRR, no m/r/g PULM: Generally poor air movement, no rhonchi , no chest wall crepitus GI: soft, bsx4 active  Extremities: warm/dry, no edema  Skin: no rashes or lesions  Resolved Hospital Problem list     Assessment & Plan:   Have reviewed cardiology outpatient consultation, multiple ED visits and palliative care consultation Reviewed serial imaging including positive VQ scan in February , chest x-ray 1/27 showing bilateral interstitial infiltrates and 2/3 showing left mid zone infiltrates with progressive consolidation in the left mid zone followed by cavitation He has refused intervention for HFrEF -22% including heart cath   Pneumomediastinum -No concurrent pneumothorax , but subcu emphysema noted -This is likely related to rupture of emphysematous bleb P:  continue supplemental O2 to maintain oxygen sats greater than 92%  Bilateral pulmonary emboli -Unclear chronicity as VQ scan 05/2019 with multiple bilateral perfusion defects and was prescribed Eliquis  P: -continue Eliquis  Cavitary lung lesions bilaterally, noncalcified 9 mm RLL nodule -Cavitary lesions possibly secondary to prior PE with infarction versus necrotizing infection, less likely TB , very unlikely malignancy-on serial imaging review, infiltrates initially followed by cavitation P: Antibiotics for 10 to 14 days, can transition to oral Augmentin eventually Will need follow-up imaging to resolution Quantiferon status unknown, cough has resolved so doubt we can obtain sputum culture or AFB but the likelihood of TB is very low  Emphysema -former smoker, on albuterol at home -no significant wheezing or respiratory failure on  admission P: -add prn duonebs  DNR noted , I think current issues of depression, failure to thrive, ischemic cardiomyopathy take precedence.  He would not want invasive diagnostic procedures Please contact us for outpatient follow-up on discharge in 3-4 wks  PCCM available as needed  Cyril Mourning MD. FCCP. [facility]  If no response to pager , please call [phone]   08/05/2019

## 2019-08-06 ENCOUNTER — Inpatient Hospital Stay (HOSPITAL_COMMUNITY): Payer: Medicare Other

## 2019-08-06 DIAGNOSIS — G9341 Metabolic encephalopathy: Secondary | ICD-10-CM

## 2019-08-06 DIAGNOSIS — I2694 Multiple subsegmental pulmonary emboli without acute cor pulmonale: Secondary | ICD-10-CM

## 2019-08-06 LAB — CBC
HCT: 28.7 % — ABNORMAL LOW (ref 39.0–52.0)
Hemoglobin: 8.7 g/dL — ABNORMAL LOW (ref 13.0–17.0)
MCH: 28.1 pg (ref 26.0–34.0)
MCHC: 30.3 g/dL (ref 30.0–36.0)
MCV: 92.6 fL (ref 80.0–100.0)
Platelets: 354 10*3/uL (ref 150–400)
RBC: 3.1 MIL/uL — ABNORMAL LOW (ref 4.22–5.81)
RDW: 15.9 % — ABNORMAL HIGH (ref 11.5–15.5)
WBC: 9.3 10*3/uL (ref 4.0–10.5)
nRBC: 0 % (ref 0.0–0.2)

## 2019-08-06 LAB — PROCALCITONIN: Procalcitonin: 0.16 ng/mL

## 2019-08-06 LAB — BASIC METABOLIC PANEL
Anion gap: 8 (ref 5–15)
BUN: 59 mg/dL — ABNORMAL HIGH (ref 8–23)
CO2: 31 mmol/L (ref 22–32)
Calcium: 8.3 mg/dL — ABNORMAL LOW (ref 8.9–10.3)
Chloride: 102 mmol/L (ref 98–111)
Creatinine, Ser: 1.78 mg/dL — ABNORMAL HIGH (ref 0.61–1.24)
GFR calc Af Amer: 41 mL/min — ABNORMAL LOW (ref >60)
GFR calc non Af Amer: 36 mL/min — ABNORMAL LOW (ref >60)
Glucose, Bld: 127 mg/dL — ABNORMAL HIGH (ref 70–99)
Potassium: 3.7 mmol/L (ref 3.5–5.1)
Sodium: 141 mmol/L (ref 135–145)

## 2019-08-06 LAB — HEPARIN LEVEL (UNFRACTIONATED): Heparin Unfractionated: 0.9 IU/mL — ABNORMAL HIGH (ref 0.30–0.70)

## 2019-08-06 LAB — APTT: aPTT: 104 seconds — ABNORMAL HIGH (ref 24–36)

## 2019-08-06 LAB — MAGNESIUM: Magnesium: 1.8 mg/dL (ref 1.7–2.4)

## 2019-08-06 LAB — PHOSPHORUS: Phosphorus: 2.8 mg/dL (ref 2.5–4.6)

## 2019-08-06 MED ORDER — APIXABAN 5 MG PO TABS
10.0000 mg | ORAL_TABLET | Freq: Two times a day (BID) | ORAL | Status: DC
  Administered 2019-08-06 – 2019-08-08 (×4): 10 mg via ORAL
  Filled 2019-08-06 (×4): qty 2

## 2019-08-06 MED ORDER — RESOURCE THICKENUP CLEAR PO POWD
ORAL | Status: DC | PRN
  Filled 2019-08-06 (×2): qty 125

## 2019-08-06 MED ORDER — APIXABAN 5 MG PO TABS: 5.0000 mg | ORAL_TABLET | Freq: Two times a day (BID) | ORAL | Status: DC

## 2019-08-06 NOTE — Plan of Care (Signed)

## 2019-08-06 NOTE — Progress Notes (Addendum)
ANTICOAGULATION CONSULT NOTE - Follow Up Consult  Pharmacy Consult for Heparin Indication: pulmonary embolus  Allergies  Allergen Reactions  . Chocolate Hives  . Sulfur Rash    Patient Measurements: Height: 5\' 8"  (172.7 cm) Weight: 52.4 kg (115 lb 8.3 oz) IBW/kg (Calculated) : 68.4 Heparin Dosing Weight:  68 kg  Vital Signs: Temp: 98.2 F (36.8 C) (04/26 0800) Temp Source: Axillary (04/26 0800) BP: 146/74 (04/26 0800) Pulse Rate: 66 (04/26 0800)  Labs: Recent Labs     0000 08/03/19 2223 08/03/19 2223 08/03/19 2314 08/04/19 0322 08/04/19 0610 08/04/19 0803 08/04/19 1612 08/05/19 0245 08/05/19 1347 08/06/19 0035  HGB   < > 8.5*   < > 11.9*  --   --   --   --  9.3*  --  8.7*  HCT  --  29.2*   < > 35.0*  --   --   --   --  30.9*  --  28.7*  PLT  --  429*  --   --   --   --   --   --  386  --  354  APTT  --  57*  --   --   --   --   --    < > 146* 68* 104*  LABPROT  --  21.4*  --   --   --   --   --   --   --   --   --   INR  --  1.9*  --   --   --   --   --   --   --   --   --   HEPARINUNFRC  --   --   --   --   --    < >  --   --  1.36* 0.96* 0.90*  CREATININE  --  1.37*  --   --   --   --   --   --  1.88*  --  1.78*  CKTOTAL  --  84  --   --   --   --   --   --   --   --   --   TROPONINIHS  --  16  --   --  21*  --  22*  --   --   --   --    < > = values in this interval not displayed.    Estimated Creatinine Clearance: 25.3 mL/min (A) (by C-G formula based on SCr of 1.78 mg/dL (H)).   Medical History: Past Medical History:  Diagnosis Date  . Bilateral carotid artery disease ([place])   . Cardiomyopathy ([place]) 05/2019  . CHF (congestive heart failure) ([place])   . Critical lower limb ischemia   . DVT (deep venous thrombosis) ([place])   . Dyspnea on exertion 05/30/2019  . Hyperlipidemia    takes Atorvastatin daily  . Hypertension    takes Amlodipine and Ramipril daily  . Joint swelling   . Nocturia   . Peripheral arterial disease ([place])   . Pulmonary embolism  ([place])    Refer to VQ scan.     Medications:  No current facility-administered medications on file prior to encounter.   Current Outpatient Medications on File Prior to Encounter  Medication Sig Dispense Refill  . acetaminophen (TYLENOL) 500 MG tablet Take 1,500 mg by mouth 2 (two) times daily as needed for headache.     . albuterol (VENTOLIN HFA) 108 (90  Base) MCG/ACT inhaler Inhale 2 puffs into the lungs every 4 (four) hours as needed for wheezing or shortness of breath.     Marland Kitchen amLODipine (NORVASC) 5 MG tablet Take 5 mg by mouth daily.    Marland Kitchen atorvastatin (LIPITOR) 20 MG tablet Take 1 tablet (20 mg total) by mouth daily. 90 tablet 3  . furosemide (LASIX) 20 MG tablet Take 1 tablet (20 mg total) by mouth daily. 90 tablet 1  . metolazone (ZAROXOLYN) 2.5 MG tablet TAKE 1 TABLET BY MOUTH EVERY MONDAY, WEDNESDAY, AND FRIDAY. (Patient taking differently: Take 2.5 mg by mouth every Monday, Wednesday, and Friday. ) 12 tablet 0  . potassium chloride SA (KLOR-CON) 20 MEQ tablet Take 1 tablet (20 mEq total) by mouth daily. Patient may request a liquid formulation. 30 tablet 0  . ramipril (ALTACE) 10 MG capsule TAKE 1 CAPSULE BY MOUTH AT BEDTIME (Patient taking differently: Take 10 mg by mouth at bedtime. ) 30 capsule 3  . Apixaban Starter Pack (ELIQUIS STARTER PACK) 5 MG TBPK Take as directed on package: start with two-5mg  tablets twice daily for 7 days. On day 8, switch to one-5mg  tablet twice daily. 1 each 0  . aspirin 81 MG chewable tablet Chew 81 mg by mouth daily.     . potassium chloride (KLOR-CON) 10 MEQ tablet Take 4 tablets (40 mEq total) by mouth daily for 4 days. (Patient not taking: Reported on 08/03/2019) 16 tablet 0     Assessment: 79 y.o. male with PE, h/o DVT/PE and on Eliquis PTA now on hold hold. Started heparin for new bilateral PE (R>L). Baseline heparin level was elevated, indicating pt has taken Eliquis recently.   Heparin level remains falsely elevated from recent Eliquis at  0.9 aPTT is now just above goal at 104 sec.  Will continue to use aPTT to dose heparin for now while heparin level affected by apixaban   Hgb trending down 8.7, pltc wnl - no bleeding noted  Goal of Therapy:  APTT 66-102 sec Heparin level 0.3-0.7 units/ml Monitor platelets by anticoagulation protocol: Yes   Plan:  Reduce heparin drip to 750 units/hr Daily heparin level and aPTT until levels correlate Monitor for bleeding  Sheppard Coil PharmD., BCPS Clinical Pharmacist 08/06/2019 8:34 AM   Addendum:  New orders received to transition back to apixaban. Given new VTE and possible noncompliance will reload patient with apixaban for the next 7 days then back to standard 5mg  bid dosing.   08/06/2019 10:25 AM

## 2019-08-06 NOTE — Evaluation (Signed)
Clinical/Bedside Swallow Evaluation Patient Details  Name: Kevin Reilly MRN: [id number] Date of Birth: [birth year]  Today's Date: 08/06/2019 Time: SLP Start Time (ACUTE ONLY): 0845 SLP Stop Time (ACUTE ONLY): 0920 SLP Time Calculation (min) (ACUTE ONLY): 35 min  Past Medical History:  Past Medical History:  Diagnosis Date  . Bilateral carotid artery disease ([facility])   . Cardiomyopathy ([facility]) 05/2019  . CHF (congestive heart failure) ([facility])   . Critical lower limb ischemia   . DVT (deep venous thrombosis) ([facility])   . Dyspnea on exertion 05/30/2019  . Hyperlipidemia    takes Atorvastatin daily  . Hypertension    takes Amlodipine and Ramipril daily  . Joint swelling   . Nocturia   . Peripheral arterial disease ([facility])   . Pulmonary embolism ([facility])    Refer to VQ scan.    Past Surgical History:  Past Surgical History:  Procedure Laterality Date  . BACK SURGERY    . colonosocpy    . ENDARTERECTOMY FEMORAL Right 02/25/2015   Procedure: ENDARTERECTOMYRight  FEMORAL Endarterectomy with profundaplasty.;  Surgeon: Sherren Kerns, MD;  Location: [facility] OR;  Service: Vascular;  Laterality: Right;  . FEMORAL-POPLITEAL BYPASS GRAFT Right 02/25/2015   Procedure: BYPASS GRAFT FEMORAL-POPLITEAL ARTERY using non-reversed saphenous vein.;  Surgeon: Sherren Kerns, MD;  Location: [facility] OR;  Service: Vascular;  Laterality: Right;  . PERIPHERAL VASCULAR CATHETERIZATION N/A 01/24/2015   Procedure: Abdominal Aortogram;  Surgeon: Sherren Kerns, MD;  Location: [facility] INVASIVE CV LAB;  Service: Cardiovascular;  Laterality: N/A;   HPI:  79 yo male adm to [facility] with FTT, pneunomediastinum, cavitary lung lesions, bilateral PE, now on heparin per MD notes.  Per notes, pt's daughter, wife and dog passed and concern for depression present.  Pt denies dysphagia.  Swallow evaluation ordered.   Assessment / Plan / Recommendation Clinical Impression  Pt presents with clinical indications concerning for oropharyngeal dysphagia  mostly to thin liquids evidenced by frequent/chronic throat clearing after each swallow.  In addition, delayed moderately significant coughing after thin water toward end of session - with pt confirming this occurrence PTA.  Chin tuck posture was difficult for pt to perform and did not eliminiate s/s of aspiration.  He clinically tolerates ice, nectar, puree and solids better. Subtle throat clear x1 of 5 boluses of nectar.  Pt denies sensing pharyngeal retention.  Of note, his caregiver (of several years) Gracie arrived and reported pt coughing with intake over the last 3 weeks.   Pt observed to consume a few bites/sips at a time before waving po off stating he doesn't want any more.  Recommend to modify diet to dys3/nectar and allow free ice chips and conduct MBS.  Pt agreeable to plan and RN informed.  Will follow up next date for MBS in am. Thanks. SLP Visit Diagnosis: Dysphagia, unspecified (R13.10);Dysphagia, oropharyngeal phase (R13.12)    Aspiration Risk  Mild aspiration risk    Diet Recommendation Dysphagia 3 (Mech soft);Nectar-thick liquid   Liquid Administration via: Cup;Straw Medication Administration: Whole meds with puree Supervision: Patient able to self feed Compensations: Slow rate;Small sips/bites Postural Changes: Seated upright at 90 degrees;Remain upright for at least 30 minutes after po intake    Other  Recommendations Oral Care Recommendations: Oral care BID Other Recommendations: Have oral suction available   Follow up Recommendations        Frequency and Duration   tbd         Prognosis   tbd     Swallow Study  General Date of Onset: 08/06/19 HPI: 79 yo male adm to [facility] with FTT, pneunomediastinum, cavitary lung lesions, bilateral PE, now on heparin per MD notes.  Per notes, pt's daughter, wife and dog passed and concern for depression present.  Pt denies dysphagia.  Swallow evaluation ordered. Type of Study: Bedside Swallow Evaluation Diet Prior to this  Study: Dysphagia 3 (soft);Thin liquids Temperature Spikes Noted: No Respiratory Status: Room air History of Recent Intubation: No Behavior/Cognition: Alert;Cooperative;Pleasant mood Oral Cavity Assessment: Within Functional Limits Oral Care Completed by SLP: No Oral Cavity - Dentition: (few lower teeth, upper partial) Vision: Functional for self-feeding Self-Feeding Abilities: Able to feed self Patient Positioning: Upright in bed Baseline Vocal Quality: Low vocal intensity;Hoarse Volitional Cough: Strong    Oral/Motor/Sensory Function Overall Oral Motor/Sensory Function: Generalized oral weakness   Ice Chips Ice chips: Within functional limits   Thin Liquid Thin Liquid: Impaired Presentation: Cup;Self Fed;Straw;Spoon Pharyngeal  Phase Impairments: Throat Clearing - Immediate;Throat Clearing - Delayed;Cough - Immediate Other Comments: throat clearing, cough consistent with thin intake despite chin tuck posture    Nectar Thick Nectar Thick Liquid: Impaired Pharyngeal Phase Impairments: Throat Clearing - Delayed   Honey Thick Honey Thick Liquid: Not tested   Puree Puree: Within functional limits Presentation: Self Fed;Spoon   Solid     Solid: Within functional limits Other Comments: minimal prolonged mastication but no oral retention      [name] 08/06/2019,10:20 AM   Kathleen Lime, MS [place] Office [phone]

## 2019-08-06 NOTE — Progress Notes (Signed)
Daily Progress Note   Patient Name: Kevin Reilly       Date: 08/06/2019 DOB: 01-Sep-1940  Age: 79 y.o. MRN#: [id number] Attending Physician: Kayleen Memos, DO Primary Care Physician: Patient, No Pcp Per Admit Date: 08/03/2019  Reason for Consultation/Follow-up: Establishing goals of care  Subjective: Patient awake, alert, oriented to person/place. Reoriented to situation. Denies pain or dyspnea. Irritable and withdrawn. No family at bedside.   GOC:  Received call from brother, Kevin Reilly requesting update. Discussed current plan of care including diagnoses, interventions, recommendations from pulmonology and PT/OT/SLP. Discussed some improvement in cognitive status today and plans for [facility] tomorrow. Discussed watchful waiting. Kevin Reilly and I agreed that it will not be safe for Kevin Reilly to return home alone. Unfortunately, Kevin Reilly shares that Kevin Reilly has "written everyone off" and has kept things to himself through the years. Kevin Reilly wants to support Kevin Reilly the best he can and requests daily updates from PMT provider. Reassured of ongoing support pending clinical improvement. Kevin Reilly has PMT contact information.    Length of Stay: 2  Current Medications: Scheduled Meds:  . apixaban  10 mg Oral BID   Followed by  . [START ON 08/13/2019] apixaban  5 mg Oral BID  . ferrous sulfate  325 mg Oral Q breakfast  . folic acid  1 mg Oral Daily    Continuous Infusions: . lactated ringers 50 mL/hr at 08/06/19 1021  . piperacillin-tazobactam (ZOSYN)  IV 3.375 g (08/06/19 1020)    PRN Meds: acetaminophen **OR** acetaminophen, Resource ThickenUp Clear  Physical Exam Vitals and nursing note reviewed.  Constitutional:      General: He is awake.     Appearance: He is cachectic. He is ill-appearing.  HENT:   Head: Normocephalic and atraumatic.  Cardiovascular:     Rate and Rhythm: Normal rate.  Pulmonary:     Effort: No tachypnea, accessory muscle usage or respiratory distress.     Comments: Room air Skin:    General: Skin is warm and dry.  Neurological:     Mental Status: He is alert.     Comments: Oriented to person/place. Reoriented on situation.  Psychiatric:     Comments: Irritable, withdrawn            Vital Signs: BP 116/67 (BP Location: Right Arm)   Pulse 81   Temp  98.2 F (36.8 C) (Oral)   Resp (!) 23   Ht 5\' 8"  (1.727 m)   Wt 52.4 kg   SpO2 100%   BMI 17.56 kg/m  SpO2: SpO2: 100 % O2 Device: O2 Device: Room Air O2 Flow Rate: O2 Flow Rate (L/min): 3 L/min  Intake/output summary:   Intake/Output Summary (Last 24 hours) at 08/06/2019 1107 Last data filed at 08/06/2019 0600 Gross per 24 hour  Intake 1531.23 ml  Output 1325 ml  Net 206.23 ml   LBM: Last BM Date: 08/05/19 Baseline Weight: Weight: 52.4 kg Most recent weight: Weight: 52.4 kg       Palliative Assessment/Data: PPS 40%      Patient Active Problem List   Diagnosis Date Noted  . Pneumomediastinum (HCC) 08/04/2019  . Pulmonary embolism (HCC) 08/04/2019  . Cavitary lesion of lung 08/04/2019  . Acute respiratory failure with hypoxemia (HCC) 08/04/2019  . Acute metabolic encephalopathy 08/04/2019  . Palliative care by specialist   . Goals of care, counseling/discussion   . DNR (do not resuscitate)   . Dilated cardiomyopathy (HCC) 06/11/2019  . Chronic combined systolic and diastolic heart failure (HCC) 06/11/2019  . Mixed hyperlipidemia 05/30/2019  . Dyspnea on exertion 05/30/2019  . Acute combined systolic and diastolic heart failure (HCC) 05/19/2019  . PAD (peripheral artery disease) (HCC) 02/25/2015  . Bilateral carotid artery disease (HCC) 02/05/2015  . Essential hypertension 01/01/2014  . Critical lower limb ischemia 01/01/2014  . Bilateral lower extremity edema 01/01/2014    Palliative  Care Assessment & Plan   Patient Profile: Per intake H&P --> Kevin Reilly a 79 y.o.malewith medical history significant ofbilateral carotid artery disease, CHF, PAD, DVT, hypertension, hyperlipidemia, history of PE on Eliquis presenting via EMS for evaluation of altered mental status. It is reported that the patient lives alone and has a caregiver who checks on him. Caregiver found him lying on the floor and acting abnormally. Patient was slow to respond. Upon EMS arrival, heart rate 120 and blood pressure 104/70.  Palliative care was asked to consult on Kevin Reilly given his severe malnutrition and poor overall clinical status.  Assessment: Large pneumomediastinum Bilateral pulmonary emboli Cavitary lung lesions Dysphagia AKI Acute metabolic encephalopathy, improving  Recommendations/Plan:  DNR/DNI in event of further decline, otherwise continue full scope treatment and medical management. Continue current plan of care. Watchful waiting. Cognitive improvement this AM and working with therapy.   Will likely need SNF placement. SW following.   PMT will continue to follow.   Code Status: DNR/DNI   Code Status Orders  (From admission, onward)         Start     Ordered   08/04/19 1625  Do not attempt resuscitation (DNR)  Continuous    Question Answer Comment  In the event of cardiac or respiratory ARREST Do not call a "code blue"   In the event of cardiac or respiratory ARREST Do not perform Intubation, CPR, defibrillation or ACLS   In the event of cardiac or respiratory ARREST Use medication by any route, position, wound care, and other measures to relive pain and suffering. May use oxygen, suction and manual treatment of airway obstruction as needed for comfort.      08/04/19 1624        Code Status History    Date Active Date Inactive Code Status Order ID Comments User Context   08/04/2019 0727 08/04/2019 1624 Full Code 08/06/2019  [id number], MD ED   02/25/2015  1758 02/27/2015 1357 Full  Code [id number]  Garth Schlatter Inpatient   01/24/2015 1154 01/24/2015 1918 Full Code [id number]  Sherren Kerns, MD Inpatient   Advance Care Planning Activity       Prognosis:   Unable to determine  Discharge Planning:  To Be Determined  Care plan was discussed with patient, brother Kevin Reilly)  Thank you for allowing the Palliative Medicine Team to assist in the care of this patient.   Time In: 1045- Time Out: 1110 Total Time 25 Prolonged Time Billed no      Greater than 50%  of this time was spent counseling and coordinating care related to the above assessment and plan.  Vennie Homans, DNP, FNP-C Palliative Medicine Team  Phone: [phone] Fax: [phone]  Please contact Palliative Medicine Team phone at [phone] for questions and concerns.

## 2019-08-06 NOTE — Progress Notes (Addendum)
PROGRESS NOTE  [name] OAC:[id number] DOB: 05-Oct-1940 DOA: 08/03/2019 PCP: Patient, No Pcp Per  HPI/Recap of past 24 hours: Kevin Reilly is a 79 y.o. male with medical history significant of bilateral carotid artery disease, CHF, PAD, DVT/PE on Eliquis, hypertension, hyperlipidemia, presenting via EMS for evaluation of altered mental status.  Caregiver found him laying on the floor and confused after several days.  Had a fall last week.  Lives alone.  Has lost his wife, daughter and dog.  Poor social support.  ED Course:  SARS-CoV-2 PCR test negative.  CT head nonacute.  CT chest with contrast showing bilateral pulmonary embolism, large amount of pneumomediastinum, suspected cavitary pneumonia, cavitary neoplasm cannot be excluded.  TRH was asked to admit.  PCCM consulted due to pneumomediastinum and cavitary lesions on CT chest, following.  08/06/19: More interactive this morning.  Denies any cough, dyspnea or chest pain.     Assessment/Plan: Principal Problem:   Pneumomediastinum ([place]) Active Problems:   Pulmonary embolism (HCC)   Cavitary lesion of lung   Acute respiratory failure with hypoxemia (HCC)   Acute metabolic encephalopathy   Palliative care by specialist   Goals of care, counseling/discussion   DNR (do not resuscitate)   Large pneumomediastinum:  Possibly trauma related.   No concurrent pneumothorax Maintain O2 saturation greater than 92% PCCM following.    Bilateral pulmonary emboli:  Patient has a history of DVT/PE and is on Eliquis.   Could be due to Eliquis failure versus noncompliance.   Heparin drip discontinued 08/06/19, no planned procedures 2D echo completed, results are pending  Cavitary lung lesions:  CT showing 9.2 cm x 4.4 cm x 4.2 cm lobulated, thick walled left upper lobe cavitary lesion.   CT also showing additional smaller right middle lobe cavitary lesion with a 9 mm noncalcified right lower lobe lung nodule. Pulmonology feels  cavitary lesions are possibly secondary to prior PE with infarction versus necrotizing infection, less likely TB. Continue Zosyn Procalcitonin 0.28 from 0.49 Blood cultures negative to date, continue to follow cultures Obtain sputum cultures Will need to repeat CT scan in 6 weeks to monitor findings noted above  Dysphagia Seen by speech therapy with recommendation for mechanical soft nectar thick liquid Continue aspiration precaution  AKI  Baseline creatinine appears to be 0.8 with GFR greater than 60 Creatinine trending down 1.7 from 1.88 Avoid nephrotoxins Continue IV fluid hydration, change fluid to LR and DC bicarb drip Monitor urine output  Resolved post repletion: Hypokalemia likely contributed by bicarb drip Potassium 3.4> 3.7. Magnesium 1.8 Replete orally with KCl 40 mEq x 2 doses Repeat BMP in the morning  Resolved non AG metabolic acidosis on IV bicarb Presented with serum bicarb 13 Serum bicarb 28  Resolved acute hypoxemic respiratory failure secondary to above:  O2 saturation 100% on room air  Acute metabolic encephalopathy, improving:  Suspect related to acute infection/ dehydration.   No focal neuro deficit.   Head CT negative for acute intracranial abnormality.   Mentation is improving.  Anemia:  Hemoglobin 9.3> 8.7 No overt bleeding Iron studies suggestive of iron deficiency and folate deficiency Continue iron supplement and folate supplement Continue to monitor H&H    DVT prophylaxis:  Eliquis Code Status: Full code Family Communication: No family available at this time. Disposition Plan: Anticipate discharge after clinical improvement. Consults called: PCCM   Objective: Vitals:   08/05/19 2000 08/06/19 0500 08/06/19 0800 08/06/19 1051  BP:  117/64 (!) 146/74 116/67  Pulse: 67  66 81  Resp: 17  14 (!) 23  Temp:  (!) 97.4 F (36.3 C) 98.2 F (36.8 C) 98.2 F (36.8 C)  TempSrc:  Oral Axillary Oral  SpO2: 98%  100% 100%  Weight:       Height:        Intake/Output Summary (Last 24 hours) at 08/06/2019 1502 Last data filed at 08/06/2019 1100 Gross per 24 hour  Intake 1512.19 ml  Output 800 ml  Net 712.19 ml   Filed Weights   08/04/19 1507  Weight: 52.4 kg    Exam:  . General: 79 y.o. year-old male well-developed well-nourished in no acute distress.  Alert and oriented x1.  Cardiovascular: Regular rate and rhythm no rubs or gallops. Marland Kitchen Respiratory: Mild rales at bases with no wheezing noted.  .  Abdomen: Soft nontender normal bowel sounds.   . Musculoskeletal: No lower extremity edema bilaterally.   Marland Kitchen Psychiatry: Mood is appropriate for condition and setting.  Data Reviewed: CBC: Recent Labs  Lab 08/03/19 2223 08/03/19 2314 08/05/19 0245 08/06/19 0035  WBC 14.0*  --  11.1* 9.3  NEUTROABS 12.8*  --   --   --   HGB 8.5* 11.9* 9.3* 8.7*  HCT 29.2* 35.0* 30.9* 28.7*  MCV 96.7  --  91.7 92.6  PLT 429*  --  386 354   Basic Metabolic Panel: Recent Labs  Lab 08/03/19 2223 08/03/19 2314 08/04/19 0322 08/05/19 0245 08/06/19 0035  NA 145 141  --  140 141  K 2.9* 4.2  --  3.4* 3.7  CL 124*  --   --  101 102  CO2 13*  --   --  28 31  GLUCOSE 85  --   --  84 127*  BUN 65*  --   --  78* 59*  CREATININE 1.37*  --   --  1.88* 1.78*  CALCIUM 5.7*  --   --  8.8* 8.3*  MG  --   --  2.3  --  1.8  PHOS  --   --   --   --  2.8   GFR: Estimated Creatinine Clearance: 25.3 mL/min (A) (by C-G formula based on SCr of 1.78 mg/dL (H)). Liver Function Tests: Recent Labs  Lab 08/03/19 2223  AST 20  ALT 19  ALKPHOS 63  BILITOT 0.5  PROT 4.5*  ALBUMIN 1.1*   No results for input(s): LIPASE, AMYLASE in the last 168 hours. Recent Labs  Lab 08/03/19 2224  AMMONIA 37*   Coagulation Profile: Recent Labs  Lab 08/03/19 2223  INR 1.9*   Cardiac Enzymes: Recent Labs  Lab 08/03/19 2223  CKTOTAL 84   BNP (last 3 results) Recent Labs    06/05/19 1530 06/08/19 1439 06/21/19 1426  PROBNP 12,549* 16,303*  8,187*   HbA1C: No results for input(s): HGBA1C in the last 72 hours. CBG: No results for input(s): GLUCAP in the last 168 hours. Lipid Profile: No results for input(s): CHOL, HDL, LDLCALC, TRIG, CHOLHDL, LDLDIRECT in the last 72 hours. Thyroid Function Tests: No results for input(s): TSH, T4TOTAL, FREET4, T3FREE, THYROIDAB in the last 72 hours. Anemia Panel: Recent Labs    08/04/19 0803  VITAMINB12 528  FOLATE 3.7*  FERRITIN 383*  TIBC 162*  IRON 30*  RETICCTPCT 1.7   Urine analysis:    Component Value Date/Time   COLORURINE YELLOW 08/04/2019 1148   APPEARANCEUR CLEAR 08/04/2019 1148   LABSPEC 1.017 08/04/2019 1148   PHURINE 5.0 08/04/2019 1148   GLUCOSEU  NEGATIVE 08/04/2019 1148   HGBUR NEGATIVE 08/04/2019 1148   BILIRUBINUR NEGATIVE 08/04/2019 1148   KETONESUR NEGATIVE 08/04/2019 1148   PROTEINUR NEGATIVE 08/04/2019 1148   UROBILINOGEN 0.2 02/21/2015 1554   NITRITE NEGATIVE 08/04/2019 1148   LEUKOCYTESUR NEGATIVE 08/04/2019 1148   Sepsis Labs: @LABRCNTIP (procalcitonin:4,lacticidven:4)  ) Recent Results (from the past 240 hour(s))  Blood Culture (routine x 2)     Status: None (Preliminary result)   Collection Time: 08/03/19 10:24 PM   Specimen: BLOOD  Result Value Ref Range Status   Specimen Description BLOOD RIGHT FOREARM  Final   Special Requests   Final    BOTTLES DRAWN AEROBIC AND ANAEROBIC Blood Culture results may not be optimal due to an inadequate volume of blood received in culture bottles   Culture   Final    NO GROWTH 3 DAYS Performed at [facility], [address], [place], [place]    Report Status PENDING  Incomplete  Blood Culture (routine x 2)     Status: None (Preliminary result)   Collection Time: 08/03/19 10:26 PM   Specimen: BLOOD  Result Value Ref Range Status   Specimen Description BLOOD LEFT ARM  Final   Special Requests   Final    BOTTLES DRAWN AEROBIC ONLY Blood Culture results may not be optimal due to an  inadequate volume of blood received in culture bottles   Culture   Final    NO GROWTH 3 DAYS Performed at [facility], [address] [place], [place]    Report Status PENDING  Incomplete  Respiratory Panel by RT PCR (Flu A&B, Covid) - Nasopharyngeal Swab     Status: None   Collection Time: 08/03/19 11:50 PM   Specimen: Nasopharyngeal Swab  Result Value Ref Range Status   SARS Coronavirus 2 by RT PCR NEGATIVE NEGATIVE Final    Comment: (NOTE) SARS-CoV-2 target nucleic acids are NOT DETECTED. The SARS-CoV-2 RNA is generally detectable in upper respiratoy specimens during the acute phase of infection. The lowest concentration of SARS-CoV-2 viral copies this assay can detect is 131 copies/mL. A negative result does not preclude SARS-Cov-2 infection and should not be used as the sole basis for treatment or other patient management decisions. A negative result may occur with  improper specimen collection/handling, submission of specimen other than nasopharyngeal swab, presence of viral mutation(s) within the areas targeted by this assay, and inadequate number of viral copies (<131 copies/mL). A negative result must be combined with clinical observations, patient history, and epidemiological information. The expected result is Negative. Fact Sheet for Patients:  08/05/19 Fact Sheet for Healthcare Providers:  https://www.moore.com/ This test is not yet ap proved or cleared by the https://www.young.biz/ FDA and  has been authorized for detection and/or diagnosis of SARS-CoV-2 by FDA under an Emergency Use Authorization (EUA). This EUA will remain  in effect (meaning this test can be used) for the duration of the COVID-19 declaration under Section 564(b)(1) of the Act, 21 U.S.C. section 360bbb-3(b)(1), unless the authorization is terminated or revoked sooner.    Influenza A by PCR NEGATIVE NEGATIVE Final   Influenza B by PCR  NEGATIVE NEGATIVE Final    Comment: (NOTE) The Xpert Xpress SARS-CoV-2/FLU/RSV assay is intended as an aid in  the diagnosis of influenza from Nasopharyngeal swab specimens and  should not be used as a sole basis for treatment. Nasal washings and  aspirates are unacceptable for Xpert Xpress SARS-CoV-2/FLU/RSV  testing. Fact Sheet for Patients: [place]  Fact Sheet for Healthcare Providers: https://www.young.biz/ This test is not yet approved or cleared by the [place] FDA and  has been authorized for detection and/or diagnosis of SARS-CoV-2 by  FDA under an Emergency Use Authorization (EUA). This EUA will remain  in effect (meaning this test can be used) for the duration of the  Covid-19 declaration under Section 564(b)(1) of the Act, 21  U.S.C. section 360bbb-3(b)(1), unless the authorization is  terminated or revoked. Performed at [facility], [address], [place], [place] [address]   Urine culture     Status: None   Collection Time: 08/04/19 11:18 AM   Specimen: In/Out Cath Urine  Result Value Ref Range Status   Specimen Description IN/OUT CATH URINE  Final   Special Requests NONE  Final   Culture   Final    NO GROWTH Performed at [facility], [address], [place], [place] [address]    Report Status 08/05/2019 FINAL  Final  MRSA PCR Screening     Status: None   Collection Time: 08/04/19  3:21 PM   Specimen: Nasal Mucosa; Nasopharyngeal  Result Value Ref Range Status   MRSA by PCR NEGATIVE NEGATIVE Final    Comment:        The GeneXpert MRSA Assay (FDA approved for NASAL specimens only), is one component of a comprehensive MRSA colonization surveillance program. It is not intended to diagnose MRSA infection nor to guide or monitor treatment for MRSA infections. Performed at [facility], [address] [place], [place] [address]       Studies: No results found.  Scheduled  Meds: . apixaban  10 mg Oral BID   Followed by  . [START ON 08/13/2019] apixaban  5 mg Oral BID  . ferrous sulfate  325 mg Oral Q breakfast  . folic acid  1 mg Oral Daily    Continuous Infusions: . lactated ringers 50 mL/hr at 08/06/19 1021  . piperacillin-tazobactam (ZOSYN)  IV 3.375 g (08/06/19 1020)     LOS: 2 days     Darlin Drop, MD [facility] Pager [phone]  If 7PM-7AM, please contact night-coverage [email or web address] Password [facility] 08/06/2019, 3:02 PM

## 2019-08-06 NOTE — Progress Notes (Signed)
  Echocardiogram 2D Echocardiogram has been performed.  Kevin Reilly 08/06/2019, 3:06 PM

## 2019-08-07 ENCOUNTER — Inpatient Hospital Stay (HOSPITAL_COMMUNITY): Payer: Medicare Other

## 2019-08-07 LAB — BASIC METABOLIC PANEL
Anion gap: 9 (ref 5–15)
BUN: 31 mg/dL — ABNORMAL HIGH (ref 8–23)
CO2: 30 mmol/L (ref 22–32)
Calcium: 8.7 mg/dL — ABNORMAL LOW (ref 8.9–10.3)
Chloride: 102 mmol/L (ref 98–111)
Creatinine, Ser: 1.39 mg/dL — ABNORMAL HIGH (ref 0.61–1.24)
GFR calc Af Amer: 56 mL/min — ABNORMAL LOW (ref >60)
GFR calc non Af Amer: 48 mL/min — ABNORMAL LOW (ref >60)
Glucose, Bld: 94 mg/dL (ref 70–99)
Potassium: 3.3 mmol/L — ABNORMAL LOW (ref 3.5–5.1)
Sodium: 141 mmol/L (ref 135–145)

## 2019-08-07 LAB — CBC
HCT: 31.2 % — ABNORMAL LOW (ref 39.0–52.0)
Hemoglobin: 9.5 g/dL — ABNORMAL LOW (ref 13.0–17.0)
MCH: 28 pg (ref 26.0–34.0)
MCHC: 30.4 g/dL (ref 30.0–36.0)
MCV: 92 fL (ref 80.0–100.0)
Platelets: 332 10*3/uL (ref 150–400)
RBC: 3.39 MIL/uL — ABNORMAL LOW (ref 4.22–5.81)
RDW: 16.1 % — ABNORMAL HIGH (ref 11.5–15.5)
WBC: 11.5 10*3/uL — ABNORMAL HIGH (ref 4.0–10.5)
nRBC: 0 % (ref 0.0–0.2)

## 2019-08-07 LAB — ECHOCARDIOGRAM COMPLETE
Height: 68 in
Weight: 1848.34 oz

## 2019-08-07 MED ORDER — APIXABAN 5 MG PO TABS: 10.0000 mg | ORAL_TABLET | Freq: Two times a day (BID) | ORAL | 2 refills | Status: DC

## 2019-08-07 MED ORDER — RESOURCE THICKENUP CLEAR PO POWD: 10.0000 | ORAL | 0 refills | Status: DC | PRN

## 2019-08-07 MED ORDER — ATORVASTATIN CALCIUM 10 MG PO TABS
20.0000 mg | ORAL_TABLET | Freq: Every day | ORAL | Status: DC
  Administered 2019-08-07 – 2019-08-08 (×2): 20 mg via ORAL
  Filled 2019-08-07 (×2): qty 2

## 2019-08-07 MED ORDER — FERROUS SULFATE 325 (65 FE) MG PO TABS: 325.0000 mg | ORAL_TABLET | Freq: Every day | ORAL | 0 refills | Status: DC

## 2019-08-07 MED ORDER — FOLIC ACID 1 MG PO TABS: 1.0000 mg | ORAL_TABLET | Freq: Every day | ORAL | 0 refills | Status: DC

## 2019-08-07 MED ORDER — POTASSIUM CHLORIDE CRYS ER 20 MEQ PO TBCR
40.0000 meq | EXTENDED_RELEASE_TABLET | Freq: Two times a day (BID) | ORAL | Status: AC
  Administered 2019-08-07 (×2): 40 meq via ORAL
  Filled 2019-08-07 (×2): qty 2

## 2019-08-07 NOTE — TOC Initial Note (Addendum)
Transition of Care [facility]) - Initial/Assessment Note    Patient Details  Name: Kevin Reilly MRN: [id number] Date of Birth: 1940/04/21  Transition of Care [facility]) CM/SW Contact:    Alberteen Sam, LCSW Phone Number: 08/07/2019, 9:37 AM  Clinical Narrative:                  Update 11:00 am: CSW has spoke with patient's caregiver [name] and lawyer [name] in patient's room at bedside. According to both parties, [name] will obtain POA by end of week and Juliann Pulse can also be contacted for any questions/concerns. CSW informed [name] of SNF rec, she is in agreement and would like CSW to send out referrals for bed offers. CSW will let [name] know of bed offers once received. [name] has been identified by patient and lawyer as Media planner, with official POA paperwork to go into affect by Friday per lawyer [name].   CSW notes patient not fully oriented, CSW spoke with patient's brother Wynetta Emery regarding discharge planning and SNF recommendation, he is in agreement with this as he reports patient lives home alone and he is not able to care for him. Reports he would like his brother to be well taken care of. Wynetta Emery states his attorney is working on [employer] for [employer], as he will be Media planner for patient. Wynetta Emery is agreeable for CSW to send out SNF referrals in the [place] area and let him know of any bed offers.   CSW has faxed out referrals, pending bed offers at this time.   Expected Discharge Plan: Skilled Nursing Facility Barriers to Discharge: Continued Medical Work up   Patient Goals and CMS Choice   CMS Medicare.gov Compare Post Acute Care list provided to:: Patient Represenative (must comment)(brother Wynetta Emery) Choice offered to / list presented to : Sibling  Expected Discharge Plan and Services Expected Discharge Plan: [place] Acute Care Choice: [place] Living arrangements for the past 2 months: [place]                                      Prior Living Arrangements/Services Living arrangements for the past 2 months: [place] Lives with:: Self Patient language and need for interpreter reviewed:: Yes Do you feel safe going back to the place where you live?: No   needs short term rehab  Need for Family Participation in Patient Care: Yes (Comment) Care giver support system in place?: Yes (comment)   Criminal Activity/Legal Involvement Pertinent to Current Situation/Hospitalization: No - Comment as needed  Activities of Daily Living      Permission Sought/Granted Permission sought to share information with : Case Manager, Customer service manager, Family Supports Permission granted to share information with : Yes, Verbal Permission Granted  Share Information with NAME: Wynetta Emery  Permission granted to share info w AGENCY: SNFs  Permission granted to share info w Relationship: brother  Permission granted to share info w Contact Information: [phone]  Emotional Assessment Appearance:: Appears stated age     Orientation: : Oriented to Self, Oriented to Place Alcohol / Substance Use: Not Applicable Psych Involvement: No (comment)  Admission diagnosis:  Pneumomediastinum ([place]) [J98.2] Hypokalemia [E87.6] Altered mental status, unspecified altered mental status type [R41.82] Multiple subsegmental pulmonary emboli without acute cor pulmonale (HCC) [I26.94] Patient Active Problem List   Diagnosis Date Noted  . Pneumomediastinum (  HCC) 08/04/2019  . Pulmonary embolism (HCC) 08/04/2019  . Cavitary lesion of lung 08/04/2019  . Acute respiratory failure with hypoxemia (HCC) 08/04/2019  . Acute metabolic encephalopathy 08/04/2019  . Palliative care by specialist   . Goals of care, counseling/discussion   . DNR (do not resuscitate)   . Dilated cardiomyopathy (HCC) 06/11/2019  . Chronic combined systolic and diastolic heart failure (HCC)  27/25/3664  . Mixed hyperlipidemia 05/30/2019  . Dyspnea on exertion 05/30/2019  . Acute combined systolic and diastolic heart failure (HCC) 05/19/2019  . PAD (peripheral artery disease) (HCC) 02/25/2015  . Bilateral carotid artery disease (HCC) 02/05/2015  . Essential hypertension 01/01/2014  . Critical lower limb ischemia 01/01/2014  . Bilateral lower extremity edema 01/01/2014   PCP:  Patient, No Pcp Per Pharmacy:   [id number] [address] - [place], [place] - [address] [place] [address] Phone: [phone] Fax: [phone]  [id number] [place], [place] - [address] AT COR[address] OF [facility] [address] [place] [address] Phone: [phone] Fax: [phone]  [id number] [place], [place] - [address] [place] [address] Phone: [phone] Fax: [phone]     Social Determinants of Health (SDOH) Interventions    Readmission Risk Interventions No flowsheet data found.

## 2019-08-07 NOTE — NC FL2 (Signed)
[place] MEDICAID FL2 LEVEL OF CARE SCREENING TOOL     IDENTIFICATION  Patient Name: Kevin Reilly Birthdate: Jan 30, 1941 Sex: male Admission Date (Current Location): 08/03/2019  [facility] and [place] Number:  Producer, television/film/video and Address:  [facility] [place], [address] [place], [place] [address]      Provider Number: [id number]  Attending Physician Name and Address:  Darlin Drop, DO  Relative Name and Phone Number:  [name] (brother) [phone]    Current Level of Care: Hospital Recommended Level of Care: Skilled Nursing Facility Prior Approval Number:    Date Approved/Denied:   PASRR Number: [id number]  Discharge Plan: SNF    Current Diagnoses: Patient Active Problem List   Diagnosis Date Noted  . Pneumomediastinum (HCC) 08/04/2019  . Pulmonary embolism (HCC) 08/04/2019  . Cavitary lesion of lung 08/04/2019  . Acute respiratory failure with hypoxemia (HCC) 08/04/2019  . Acute metabolic encephalopathy 08/04/2019  . Palliative care by specialist   . Goals of care, counseling/discussion   . DNR (do not resuscitate)   . Dilated cardiomyopathy (HCC) 06/11/2019  . Chronic combined systolic and diastolic heart failure (HCC) 06/11/2019  . Mixed hyperlipidemia 05/30/2019  . Dyspnea on exertion 05/30/2019  . Acute combined systolic and diastolic heart failure (HCC) 05/19/2019  . PAD (peripheral artery disease) (HCC) 02/25/2015  . Bilateral carotid artery disease (HCC) 02/05/2015  . Essential hypertension 01/01/2014  . Critical lower limb ischemia 01/01/2014  . Bilateral lower extremity edema 01/01/2014    Orientation RESPIRATION BLADDER Height & Weight     Self, Place  Normal Incontinent(urethral catheter) Weight: 115 lb 8.3 oz (52.4 kg) Height:  5\' 8"  (172.7 cm)  BEHAVIORAL SYMPTOMS/MOOD NEUROLOGICAL BOWEL NUTRITION STATUS      Continent Diet(see discharge summary)  AMBULATORY STATUS COMMUNICATION OF NEEDS Skin   Extensive Assist  Verbally Other (Comment)(ecchymosis arms, skin tear left arm)                       Personal Care Assistance Level of Assistance  Bathing, Feeding, Dressing, Total care Bathing Assistance: Maximum assistance Feeding assistance: Maximum assistance Dressing Assistance: Maximum assistance Total Care Assistance: Maximum assistance   Functional Limitations Info  Sight, Hearing, Speech Sight Info: Adequate Hearing Info: Adequate Speech Info: Adequate    SPECIAL CARE FACTORS FREQUENCY  PT (By licensed PT), OT (By licensed OT)     PT Frequency: min 5x weekly OT Frequency: min 5x weekly            Contractures Contractures Info: Not present    Additional Factors Info  Code Status, Allergies Code Status Info: DNR Allergies Info: Chocolate, sulfur           Current Medications (08/07/2019):  This is the current hospital active medication list Current Facility-Administered Medications  Medication Dose Route Frequency Provider Last Rate Last Admin  . acetaminophen (TYLENOL) tablet 650 mg  650 mg Oral Q6H PRN 08/09/2019, MD       Or  . acetaminophen (TYLENOL) suppository 650 mg  650 mg Rectal Q6H PRN John Giovanni, MD      . apixaban John Giovanni) tablet 10 mg  10 mg Oral BID Everlene Balls, RPH   10 mg at 08/06/19 2126   Followed by  . [START ON 08/13/2019] apixaban (ELIQUIS) tablet 5 mg  5 mg Oral BID 10/13/2019, [facility]      . atorvastatin (LIPITOR) tablet 20 mg  20 mg Oral Daily Hall,  Lorenda Cahill, DO      . ferrous sulfate tablet 325 mg  325 mg Oral Q breakfast Kayleen Memos, DO   325 mg at 08/07/19 9295  . folic acid (FOLVITE) tablet 1 mg  1 mg Oral Daily Nord, Carole N, DO   1 mg at 08/05/19 1225  . lactated ringers infusion   Intravenous Continuous Kayleen Memos, DO 50 mL/hr at 08/07/19 7473 New Bag at 08/07/19 4037  . piperacillin-tazobactam (ZOSYN) IVPB 3.375 g  3.375 g Intravenous Q8H Barb Merino, MD 12.5 mL/hr at 08/07/19 0026 3.375 g at 08/07/19 0026   . potassium chloride SA (KLOR-CON) CR tablet 40 mEq  40 mEq Oral BID Irene Pap N, DO   40 mEq at 08/07/19 0964  . Resource ThickenUp Clear   Oral PRN Kayleen Memos, DO         Discharge Medications: Please see discharge summary for a list of discharge medications.  Relevant Imaging Results:  Relevant Lab Results:   Additional Information SSN: [id number]  Alberteen Sam, LCSW

## 2019-08-07 NOTE — Progress Notes (Signed)
Physical Therapy Treatment Patient Details Name: Kevin Reilly MRN: [id number] DOB: [birth year] Today's Date: 08/07/2019    History of Present Illness Pt is a 79 yo male s/p AMS, found down by caregiver. CT scan of the C spine showed superior pneumomediastinum.  CT scan of the chest showed bilateral pulmonary embolism, left upper lobe cavitary lesion. pMHx: CAD, CHF, PVD, HTN.    PT Comments    Pt restless pulling at leads on arrival. Pt cued for transition to EOB and moving with cues and assist to EOB and stand. Pt then refused OOB to chair or even fully side stepping toward HOB and scooted in sitting toward HOB then scooted toward HOB in supine. Pt not oriented to time or situation and educated for importance of mobility and progression but refused further activity.     Follow Up Recommendations  SNF;Supervision/Assistance - 24 hour     Equipment Recommendations       Recommendations for Other Services       Precautions / Restrictions Precautions Precautions: Fall    Mobility  Bed Mobility Overal bed mobility: Needs Assistance Bed Mobility: Supine to Sit;Sit to Supine     Supine to sit: Min assist;HOB elevated Sit to supine: Min guard   General bed mobility comments: assist to move legs toward EOB, initiate and elevate trunk. guarding for lines and safety with return  Transfers Overall transfer level: Needs assistance   Transfers: Sit to/from Stand Sit to Stand: Min assist;+2 safety/equipment         General transfer comment: HHA with cues. Pt able to stand and partially side step then scoot along EOB. Pt refused further mobility  Ambulation/Gait             General Gait Details: refused   Stairs             Wheelchair Mobility    Modified Rankin (Stroke Patients Only)       Balance Overall balance assessment: Needs assistance   Sitting balance-Leahy Scale: Fair Sitting balance - Comments: seated EOB with guarding for lined   Standing  balance support: Bilateral upper extremity supported Standing balance-Leahy Scale: Poor Standing balance comment: bil HHA                            Cognition Arousal/Alertness: Awake/alert Behavior During Therapy: Restless Overall Cognitive Status: Impaired/Different from baseline Area of Impairment: Attention;Memory;Safety/judgement;Problem solving;Orientation                 Orientation Level: Disoriented to;Time;Situation Current Attention Level: Sustained Memory: Decreased short-term memory;Decreased recall of precautions   Safety/Judgement: Decreased awareness of safety;Decreased awareness of deficits   Problem Solving: Slow processing General Comments: pt restless pulling at lines, only receptive to standing then resistive      Exercises      General Comments        Pertinent Vitals/Pain Pain Assessment: No/denies pain    Home Living                      Prior Function            PT Goals (current goals can now be found in the care plan section) Progress towards PT goals: Progressing toward goals(very limited)    Frequency    Min 2X/week      PT Plan Current plan remains appropriate;Frequency needs to be updated    Co-evaluation  AM-PAC PT "6 Clicks" Mobility   Outcome Measure  Help needed turning from your back to your side while in a flat bed without using bedrails?: A Little Help needed moving from lying on your back to sitting on the side of a flat bed without using bedrails?: A Little Help needed moving to and from a bed to a chair (including a wheelchair)?: A Little Help needed standing up from a chair using your arms (e.g., wheelchair or bedside chair)?: A Little Help needed to walk in hospital room?: Total Help needed climbing 3-5 steps with a railing? : Total 6 Click Score: 14    End of Session   Activity Tolerance: Patient tolerated treatment well;Other (comment)(limited by cognition and  willingness to participate) Patient left: in bed;with bed alarm set;with nursing/sitter in room Nurse Communication: Mobility status PT Visit Diagnosis: Other abnormalities of gait and mobility (R26.89);History of falling (Z91.81);Other symptoms and signs involving the nervous system (R29.898)     Time: 1210-1223 PT Time Calculation (min) (ACUTE ONLY): 13 min  Charges:  $Therapeutic Activity: 8-22 mins                     Kevin Reilly P, PT Acute Rehabilitation Services Pager: [phone] Office: Kevin Reilly Kevin Reilly 08/07/2019, 12:34 PM

## 2019-08-07 NOTE — Plan of Care (Signed)

## 2019-08-07 NOTE — Progress Notes (Signed)
Chaplain engaged in initial visit with Kevin Reilly.  During visit Keath requested that chaplain take off his restraints.  Chaplain explained that she could not do that and then Zumbrota asked Chaplain to bring him scissors.  Arnel was close to pulling off his left-hand restraint and IV when chaplain went and got the nurse.  Nurse was able to provide comfort to Asaph and stop him from pulling off his restraint and IV.  Chaplain will follow-up.

## 2019-08-07 NOTE — Progress Notes (Signed)
Modified Barium Swallow Progress Note  Patient Details  Name: Kevin Reilly MRN: [id number] Date of Birth: Jul 16, 1940  Today's Date: 08/07/2019  Modified Barium Swallow completed.  Full report located under Chart Review in the Imaging Section.  Brief recommendations include the following:  Clinical Impression  Patient presents with sensormotor based oropharyngeal dysphagia.  Weak muscle contraction and decreased oral control results in poor oral bolus cohesion and pharyngeal retention/aspiration.  Lingual pumping observed across consistencies with delay - SLP counting 1, 2, 3 - did not appear to improve efficency.  Pt aspirated thin liquids due to decreased timing and adequacy of laryngeal closure.  Reflexive cough noted with aspiration but did not clear aspirates.  Chin tuck posture prevented aspiration but allowed laryngeal penetration of thin.  Pharyngeal retention noted across all consistencies due to weakness without adequate sensation/awareness.  Pt does intermittent propel vallecular resdiuals into oral cavity using tongue base and expectorate= admitting he has done this prior to admissoin.  SLP recommends to continue current diet with strict precautions and allow single tsps of thin with chin tuck (with staff only).  Pt's swallowing appears laborious for him and concern for nutritional adequacy present.  Using teach back, educated pt to findings/recommendations.  Thankful palliative is following.  Tenna Child Evaluation Recommendations       SLP Diet Recommendations: Dysphagia 3 (Mech soft) solids;Nectar thick liquid(tsps thin with chin tuck, ice chips ok)   Liquid Administration via: Cup;Straw   Medication Administration: Crushed with puree   Supervision: Patient able to self feed   Compensations: Slow rate;Small sips/bites   Postural Changes: Seated upright at 90 degrees;Remain semi-upright after after feeds/meals (Comment)   Oral Care Recommendations: Oral care QID   Other  Recommendations: Have oral suction available(cue pt to expectorate during meal intermittently)   Rolena Infante, MS [facility] SLP Acute Rehab Services Office [phone]  Chales Abrahams 08/07/2019,9:59 AM

## 2019-08-07 NOTE — Progress Notes (Signed)
Daily Progress Note   Patient Name: Kevin Reilly       Date: 08/07/19 DOB: 08/31/1940  Age: 79 y.o. MRN#: [id number] Attending Physician: Kayleen Memos, DO Primary Care Physician: Patient, No Pcp Per Admit Date: 08/03/2019   Reason for Consultation/Follow-up: Establishing goals of care  Subjective/GOC: Patient awake, alert, oriented to person/place but remains confused on situation and requiring mitts due to pulling at lines. RN at bedside repositioning.  Patient confirms with this NP and RN at bedside his desire for friend/caregiver Kevin Reilly to be POA and he does not want [place] talking to his brother. Received update from social worker that official POA paperwork will be in place by Friday per patient's lawyer [name].   Explained to patient that he is showing signs of improvement but very weak and will need SNF rehab when discharged. Patient nods his head yes in understanding of this.    Length of Stay: 3  Current Medications: Scheduled Meds:  . apixaban  10 mg Oral BID   Followed by  . [START ON 08/13/2019] apixaban  5 mg Oral BID  . atorvastatin  20 mg Oral Daily  . ferrous sulfate  325 mg Oral Q breakfast  . folic acid  1 mg Oral Daily  . potassium chloride  40 mEq Oral BID    Continuous Infusions: . piperacillin-tazobactam (ZOSYN)  IV 3.375 g (08/07/19 1043)    PRN Meds: acetaminophen **OR** acetaminophen, Resource ThickenUp Clear  Physical Exam Vitals and nursing note reviewed.  Constitutional:      General: He is awake.     Appearance: He is cachectic. He is ill-appearing.  HENT:     Head: Normocephalic and atraumatic.  Cardiovascular:     Rate and Rhythm: Normal rate.  Pulmonary:     Effort: No tachypnea, accessory muscle usage or respiratory distress.   Comments: Room air Skin:    General: Skin is warm and dry.  Neurological:     Mental Status: He is alert.     Comments: Oriented to person/place. Reoriented on situation.  Psychiatric:     Comments: Irritable, withdrawn            Vital Signs: BP 137/77 (BP Location: Right Arm)   Pulse 84   Temp 99.4 F (37.4 C) (Axillary)   Resp 19  Ht 5\' 8"  (1.727 m)   Wt 52.4 kg   SpO2 98%   BMI 17.56 kg/m  SpO2: SpO2: 98 % O2 Device: O2 Device: Room Air O2 Flow Rate: O2 Flow Rate (L/min): 3 L/min  Intake/output summary:   Intake/Output Summary (Last 24 hours) at 08/07/2019 1351 Last data filed at 08/07/2019 1335 Gross per 24 hour  Intake 1459.48 ml  Output 2450 ml  Net -990.52 ml   LBM: Last BM Date: 08/05/19 Baseline Weight: Weight: 52.4 kg Most recent weight: Weight: 52.4 kg       Palliative Assessment/Data: PPS 40%      Patient Active Problem List   Diagnosis Date Noted  . Pneumomediastinum (HCC) 08/04/2019  . Pulmonary embolism (HCC) 08/04/2019  . Cavitary lesion of lung 08/04/2019  . Acute respiratory failure with hypoxemia (HCC) 08/04/2019  . Acute metabolic encephalopathy 08/04/2019  . Palliative care by specialist   . Goals of care, counseling/discussion   . DNR (do not resuscitate)   . Dilated cardiomyopathy (HCC) 06/11/2019  . Chronic combined systolic and diastolic heart failure (HCC) 06/11/2019  . Mixed hyperlipidemia 05/30/2019  . Dyspnea on exertion 05/30/2019  . Acute combined systolic and diastolic heart failure (HCC) 05/19/2019  . PAD (peripheral artery disease) (HCC) 02/25/2015  . Bilateral carotid artery disease (HCC) 02/05/2015  . Essential hypertension 01/01/2014  . Critical lower limb ischemia 01/01/2014  . Bilateral lower extremity edema 01/01/2014    Palliative Care Assessment & Plan   Patient Profile: Per intake H&P --> Kevin Reilly 79 y.o.malewith medical history significant ofbilateral carotid artery disease, CHF, PAD, DVT,  hypertension, hyperlipidemia, history of PE on Eliquis presenting via EMS for evaluation of altered mental status. It is reported that the patient lives alone and has Reilly caregiver who checks on him. Caregiver found him lying on the floor and acting abnormally. Patient was slow to respond. Upon EMS arrival, heart rate 120 and blood pressure 104/70.  Palliative care was asked to consult on Kevin Reilly given his severe malnutrition and poor overall clinical status.  Assessment: Large pneumomediastinum Bilateral pulmonary emboli Cavitary lung lesions Dysphagia AKI Acute metabolic encephalopathy, improving  Recommendations/Plan:  DNR/DNI in event of further decline, otherwise continue full scope treatment and medical management. Continue current plan of care. Watchful waiting. Cognitive improvement this AM and working with therapy.   Will need SNF placement. SW following.   Per patient, he does not wish for care team to speak with brother. Patient requesting friend/cargiver Kevin Reilly to be POA and patient's lawyer will have paperwork completed by Friday 4/30.  Code Status: DNR/DNI   Code Status Orders  (From admission, onward)         Start     Ordered   08/04/19 1625  Do not attempt resuscitation (DNR)  Continuous    Question Answer Comment  In the event of cardiac or respiratory ARREST Do not call Reilly "code blue"   In the event of cardiac or respiratory ARREST Do not perform Intubation, CPR, defibrillation or ACLS   In the event of cardiac or respiratory ARREST Use medication by any route, position, wound care, and other measures to relive pain and suffering. May use oxygen, suction and manual treatment of airway obstruction as needed for comfort.      08/04/19 1624        Code Status History    Date Active Date Inactive Code Status Order ID Comments User Context   08/04/2019 0727 08/04/2019 1624 Full Code 08/06/2019  [place],  Ulyess Blossom, MD ED   02/25/2015 1758 02/27/2015 1357 Full Code  [id number]  Garth Schlatter Inpatient   01/24/2015 1154 01/24/2015 1918 Full Code [id number]  Sherren Kerns, MD Inpatient   Advance Care Planning Activity       Prognosis:   Unable to determine  Discharge Planning:  Skilled Nursing Facility for rehab with Palliative care service follow-up  Care plan was discussed with patient, RN, SW  Thank you for allowing the Palliative Medicine Team to assist in the care of this patient.   Time In: 1210 Time Out: 1230 Total Time 20 Prolonged Time Billed no      Greater than 50%  of this time was spent counseling and coordinating care related to the above assessment and plan.  Vennie Homans, DNP, FNP-C Palliative Medicine Team  Phone: [phone] Fax: [phone]  Please contact Palliative Medicine Team phone at [phone] for questions and concerns.

## 2019-08-07 NOTE — Discharge Instructions (Addendum)
Pulmonary Embolism  A pulmonary embolism (PE) is a sudden blockage or decrease of blood flow in one or both lungs. Most blockages come from a blood clot that forms in the vein of a lower leg, thigh, or arm (deep vein thrombosis, DVT) and travels to the lungs. A clot is blood that has thickened into a gel or solid. PE is a dangerous and life-threatening condition that needs to be treated right away. What are the causes? This condition is usually caused by a blood clot that forms in a vein and moves to the lungs. In rare cases, it may be caused by air, fat, part of a tumor, or other tissue that moves through the veins and into the lungs. What increases the risk? The following factors may make you more likely to develop this condition:  Experiencing a traumatic injury, such as breaking a hip or leg.  Having: ? A spinal cord injury. ? Orthopedic surgery, especially hip or knee replacement. ? Any major surgery. ? A stroke. ? DVT. ? Blood clots or blood clotting disease. ? Long-term (chronic) lung or heart disease. ? Cancer treated with chemotherapy. ? A central venous catheter.  Taking medicines that contain estrogen. These include birth control pills and hormone replacement therapy.  Being: ? Pregnant. ? In the period of time after your baby is delivered (postpartum). ? Older than age 60. ? Overweight. ? A smoker, especially if you have other risks. What are the signs or symptoms? Symptoms of this condition usually start suddenly and include:  Shortness of breath during activity or at rest.  Coughing, coughing up blood, or coughing up blood-tinged mucus.  Chest pain that is often worse with deep breaths.  Rapid or irregular heartbeat.  Feeling light-headed or dizzy.  Fainting.  Feeling anxious.  Fever.  Sweating.  Pain and swelling in a leg. This is a symptom of DVT, which can lead to PE. How is this diagnosed? This condition may be diagnosed based on:  Your medical  history.  A physical exam.  Blood tests.  CT pulmonary angiogram. This test checks blood flow in and around your lungs.  Ventilation-perfusion scan, also called a lung VQ scan. This test measures air flow and blood flow to the lungs.  An ultrasound of the legs. How is this treated? Treatment for this condition depends on many factors, such as the cause of your PE, your risk for bleeding or developing more clots, and other medical conditions you have. Treatment aims to remove, dissolve, or stop blood clots from forming or growing larger. Treatment may include:  Medicines, such as: ? Blood thinning medicines (anticoagulants) to stop clots from forming. ? Medicines that dissolve clots (thrombolytics).  Procedures, such as: ? Using a flexible tube to remove a blood clot (embolectomy) or to deliver medicine to destroy it (catheter-directed thrombolysis). ? Inserting a filter into a large vein that carries blood to the heart (inferior vena cava). This filter (vena cava filter) catches blood clots before they reach the lungs. ? Surgery to remove the clot (surgical embolectomy). This is rare. You may need a combination of immediate, long-term (up to 3 months after diagnosis), and extended (more than 3 months after diagnosis) treatments. Your treatment may continue for several months (maintenance therapy). You and your health care provider will work together to choose the treatment program that is best for you. Follow these instructions at home: Medicines  Take over-the-counter and prescription medicines only as told by your health care provider.  If you   are taking an anticoagulant medicine: ? Take the medicine every day at the same time each day. ? Understand what foods and drugs interact with your medicine. ? Understand the side effects of this medicine, including excessive bruising or bleeding. Ask your health care provider or pharmacist about other side effects. General  instructions  Wear a medical alert bracelet or carry a medical alert card that says you have had a PE and lists what medicines you take.  Ask your health care provider when you may return to your normal activities. Avoid sitting or lying for a long time without moving.  Maintain a healthy weight. Ask your health care provider what weight is healthy for you.  Do not use any products that contain nicotine or tobacco, such as cigarettes, e-cigarettes, and chewing tobacco. If you need help quitting, ask your health care provider.  Talk with your health care provider about any travel plans. It is important to make sure that you are still able to take your medicine while on trips.  Keep all follow-up visits as told by your health care provider. This is important. Contact a health care provider if:  You missed a dose of your blood thinner medicine. Get help right away if:  You have: ? New or increased pain, swelling, warmth, or redness in an arm or leg. ? Numbness or tingling in an arm or leg. ? Shortness of breath during activity or at rest. ? A fever. ? Chest pain. ? A rapid or irregular heartbeat. ? A severe headache. ? Vision changes. ? A serious fall or accident, or you hit your head. ? Stomach (abdominal) pain. ? Blood in your vomit, stool, or urine. ? A cut that will not stop bleeding.  You cough up blood.  You feel light-headed or dizzy.  You cannot move your arms or legs.  You are confused or have memory loss. These symptoms may represent a serious problem that is an emergency. Do not wait to see if the symptoms will go away. Get medical help right away. Call your local emergency services (911 in the [place] Do not drive yourself to the hospital. Summary  A pulmonary embolism (PE) is a sudden blockage or decrease of blood flow in one or both lungs. PE is a dangerous and life-threatening condition that needs to be treated right away.  Treatments for this condition usually  include medicines to thin your blood (anticoagulants) or medicines to break apart blood clots (thrombolytics).  If you are given blood thinners, it is important to take the medicine every day at the same time each day.  Understand what foods and drugs interact with any medicines that you are taking.  If you have signs of PE or DVT, call your local emergency services (911 in the [place] This information is not intended to replace advice given to you by your health care provider. Make sure you discuss any questions you have with your health care provider. Document Revised: 01/04/2018 Document Reviewed: 01/04/2018 Elsevier Patient Education  2020 [place] on my medicine - ELIQUIS (apixaban)  Why was Eliquis prescribed for you? Eliquis was prescribed to treat blood clots that may have been found in the veins of your legs (deep vein thrombosis) or in your lungs (pulmonary embolism) and to reduce the risk of them occurring again.  What do You need to know about Eliquis ? The starting dose is 10 mg (two 5 mg tablets) taken TWICE daily for the FIRST SEVEN (7) DAYS,  then  the dose is reduced to ONE 5 mg tablet taken TWICE daily.  Eliquis may be taken with or without food.   Try to take the dose about the same time in the morning and in the evening. If you have difficulty swallowing the tablet whole please discuss with your pharmacist how to take the medication safely.  Take Eliquis exactly as prescribed and DO NOT stop taking Eliquis without talking to the doctor who prescribed the medication.  Stopping may increase your risk of developing a new blood clot.  Refill your prescription before you run out.  After discharge, you should have regular check-up appointments with your healthcare provider that is prescribing your Eliquis.    What do you do if you miss a dose? If a dose of ELIQUIS is not taken at the scheduled time, take it as soon as possible on the same day and  twice-daily administration should be resumed. The dose should not be doubled to make up for a missed dose.  Important Safety Information A possible side effect of Eliquis is bleeding. You should call your healthcare provider right away if you experience any of the following: ? Bleeding from an injury or your nose that does not stop. ? Unusual colored urine (red or dark brown) or unusual colored stools (red or black). ? Unusual bruising for unknown reasons. ? A serious fall or if you hit your head (even if there is no bleeding).  Some medicines may interact with Eliquis and might increase your risk of bleeding or clotting while on Eliquis. To help avoid this, consult your healthcare provider or pharmacist prior to using any new prescription or non-prescription medications, including herbals, vitamins, non-steroidal anti-inflammatory drugs (NSAIDs) and supplements.  This website has more information on Eliquis (apixaban): http://www.eliquis.com/eliquis/home

## 2019-08-07 NOTE — Progress Notes (Addendum)
PROGRESS NOTE  Kevin Reilly:[id number] DOB: [birth year] DOA: 08/03/2019 PCP: Patient, No Pcp Per  HPI/Recap of past 24 hours: Kevin Reilly is a 79 y.o. male with medical history significant of bilateral carotid artery disease, CHF, PAD, DVT/PE on Eliquis, hypertension, hyperlipidemia, presenting via EMS for evaluation of altered mental status.  Caregiver found him laying on the floor and confused after several days.  Had a fall last week.  Lives alone.  Has lost his wife, daughter and dog.  Poor social support.  ED Course:  SARS-CoV-2 PCR test negative.  CT head nonacute.  CT chest with contrast showing bilateral pulmonary embolism, large amount of pneumomediastinum, suspected cavitary pneumonia, cavitary neoplasm cannot be excluded.  TRH was asked to admit.  PCCM consulted due to pneumomediastinum and cavitary lesions on CT chest, following.  08/07/19: Seen and examined.  More interactive this morning.  He has no new complaints.  Denies chest pain, dyspnea or cough.  2D echo completed on 08/06/2019 showing LVEF 25-30% with grade 1DD.  Will follow up with his cardiologist Dr. Jacinto Halim outpatient after discharge to SNF.   Assessment/Plan: Principal Problem:   Pneumomediastinum (HCC) Active Problems:   Pulmonary embolism (HCC)   Cavitary lesion of lung   Acute respiratory failure with hypoxemia (HCC)   Acute metabolic encephalopathy   Palliative care by specialist   Goals of care, counseling/discussion   DNR (do not resuscitate)   Large pneumomediastinum:  Possibly trauma related.   No concurrent pneumothorax Maintain O2 saturation greater than 92% PCCM following.    Bilateral pulmonary emboli in the setting of previous DVT:  Patient has a history of DVT/PE and is on Eliquis.   Could be due to Eliquis failure versus noncompliance.   Heparin drip discontinued 08/06/19, no planned procedures 2D echo completed, results are pending  Cavitary lung lesions:  CT showing 9.2 cm x 4.4  cm x 4.2 cm lobulated, thick walled left upper lobe cavitary lesion.   CT also showing additional smaller right middle lobe cavitary lesion with a 9 mm noncalcified right lower lobe lung nodule. Pulmonary feels cavitary lesions are possibly secondary to prior PE with infarction versus necrotizing infection, less likely TB. Continue Zosyn day number 4 out of 5 Procalcitonin trending down 0.1 from 0.28 from 0.49 Blood cultures negative to date, continue to follow cultures Unable to obtain sputum culture due to no cough Will need to repeat CT scan in 6 weeks to monitor findings noted above  Chronic Combined diastolic and systolic CHF LVEF 25-30% with W1XB Euvolemic on exam Will follow up with his cardiologist Dr. Jacinto Halim outpatient, discussed with cardiology. Diuretics on hold due to AKI Continue strict I's and O's and daily weights Net I&O +2.3 L  AKI prerenal in the setting of severe dehydration from poor oral intake Baseline creatinine appears to be 0.8 with GFR greater than 60 Creatinine trending down 1.3 from 1.7 from 1.88 Continue to avoid nephrotoxins Has received IV fluids which were discontinued  Dysphagia/dehydration/poor oral intake Seen by speech therapy with recommendation for mechanical soft nectar thick liquid Encourage oral intake as tolerated with aspiration precautions Speech therapy is following  Hypokalemia  Potassium 3.4> 3.7> 3.3. Magnesium 1.8 Replete orally with KCl 40 mEq x 2 doses Repeat BMP in the morning  Resolved non AG metabolic acidosis on IV bicarb Presented with serum bicarb 13, resolved after bicarb drip Serum bicarb 30.  Resolved acute hypoxemic respiratory failure secondary to above:  O2 saturation 100% on room air  Acute metabolic encephalopathy, improving:  Suspect related to acute infection/ dehydration.   No focal neuro deficit.   Head CT negative for acute intracranial abnormality.   Mentation is improving.  Anemia:  Hemoglobin  9.3> 8.7> 9.5 No overt bleeding Iron studies suggestive of iron deficiency and folate deficiency Continue iron supplement and folate supplement   DVT prophylaxis:  Eliquis Code Status: Full code Family Communication: No family available at this time.  Disposition Plan: Patient is from home.  Anticipate discharge to SNF tomorrow if bed available.  Consults called: PCCM   Objective: Vitals:   08/07/19 0400 08/07/19 0500 08/07/19 0800 08/07/19 1158  BP: 127/63 129/69 137/77   Pulse:  90 84   Resp: 17 20  19   Temp:  98.5 F (36.9 C) 99.4 F (37.4 C)   TempSrc:  Oral Axillary   SpO2:  99% 98%   Weight:      Height:        Intake/Output Summary (Last 24 hours) at 08/07/2019 1320 Last data filed at 08/07/2019 0800 Gross per 24 hour  Intake 1459.48 ml  Output 2150 ml  Net -690.52 ml   Filed Weights   08/04/19 1507  Weight: 52.4 kg    Exam:  . General: 79 y.o. year-old male well-developed well-nourished in no acute distress.  Alert and oriented x2.  Cardiovascular: Regular rate and rhythm no rubs or gallops.   70 Respiratory: Mild rales at bases no wheezing noted.  .  Abdomen: Soft nontender normal bowel sounds present.   . Musculoskeletal: No lower extremity edema bilaterally. Marland Kitchen Psychiatry: Mood is appropriate for condition and setting.   Data Reviewed: CBC: Recent Labs  Lab 08/03/19 2223 08/03/19 2314 08/05/19 0245 08/06/19 0035 08/07/19 0218  WBC 14.0*  --  11.1* 9.3 11.5*  NEUTROABS 12.8*  --   --   --   --   HGB 8.5* 11.9* 9.3* 8.7* 9.5*  HCT 29.2* 35.0* 30.9* 28.7* 31.2*  MCV 96.7  --  91.7 92.6 92.0  PLT 429*  --  386 354 332   Basic Metabolic Panel: Recent Labs  Lab 08/03/19 2223 08/03/19 2314 08/04/19 0322 08/05/19 0245 08/06/19 0035 08/07/19 0218  NA 145 141  --  140 141 141  K 2.9* 4.2  --  3.4* 3.7 3.3*  CL 124*  --   --  101 102 102  CO2 13*  --   --  28 31 30   GLUCOSE 85  --   --  84 127* 94  BUN 65*  --   --  78* 59* 31*    CREATININE 1.37*  --   --  1.88* 1.78* 1.39*  CALCIUM 5.7*  --   --  8.8* 8.3* 8.7*  MG  --   --  2.3  --  1.8  --   PHOS  --   --   --   --  2.8  --    GFR: Estimated Creatinine Clearance: 32.5 mL/min (A) (by C-G formula based on SCr of 1.39 mg/dL (H)). Liver Function Tests: Recent Labs  Lab 08/03/19 2223  AST 20  ALT 19  ALKPHOS 63  BILITOT 0.5  PROT 4.5*  ALBUMIN 1.1*   No results for input(s): LIPASE, AMYLASE in the last 168 hours. Recent Labs  Lab 08/03/19 2224  AMMONIA 37*   Coagulation Profile: Recent Labs  Lab 08/03/19 2223  INR 1.9*   Cardiac Enzymes: Recent Labs  Lab 08/03/19 2223  CKTOTAL 84   BNP (  last 3 results) Recent Labs    06/05/19 1530 06/08/19 1439 06/21/19 1426  PROBNP 12,549* 16,303* 8,187*   HbA1C: No results for input(s): HGBA1C in the last 72 hours. CBG: No results for input(s): GLUCAP in the last 168 hours. Lipid Profile: No results for input(s): CHOL, HDL, LDLCALC, TRIG, CHOLHDL, LDLDIRECT in the last 72 hours. Thyroid Function Tests: No results for input(s): TSH, T4TOTAL, FREET4, T3FREE, THYROIDAB in the last 72 hours. Anemia Panel: No results for input(s): VITAMINB12, FOLATE, FERRITIN, TIBC, IRON, RETICCTPCT in the last 72 hours. Urine analysis:    Component Value Date/Time   COLORURINE YELLOW 08/04/2019 1148   APPEARANCEUR CLEAR 08/04/2019 1148   LABSPEC 1.017 08/04/2019 1148   PHURINE 5.0 08/04/2019 1148   GLUCOSEU NEGATIVE 08/04/2019 1148   HGBUR NEGATIVE 08/04/2019 1148   [place] 08/04/2019 1148   KETONESUR NEGATIVE 08/04/2019 1148   PROTEINUR NEGATIVE 08/04/2019 1148   UROBILINOGEN 0.2 02/21/2015 1554   NITRITE NEGATIVE 08/04/2019 1148   LEUKOCYTESUR NEGATIVE 08/04/2019 1148   Sepsis Labs: @LABRCNTIP (procalcitonin:4,lacticidven:4)  ) Recent Results (from the past 240 hour(s))  Blood Culture (routine x 2)     Status: None (Preliminary result)   Collection Time: 08/03/19 10:24 PM   Specimen:  BLOOD  Result Value Ref Range Status   Specimen Description BLOOD RIGHT FOREARM  Final   Special Requests   Final    BOTTLES DRAWN AEROBIC AND ANAEROBIC Blood Culture results may not be optimal due to an inadequate volume of blood received in culture bottles   Culture   Final    NO GROWTH 4 DAYS Performed at [facility], [place] [address] [place], [place] [address]    Report Status PENDING  Incomplete  Blood Culture (routine x 2)     Status: None (Preliminary result)   Collection Time: 08/03/19 10:26 PM   Specimen: BLOOD  Result Value Ref Range Status   Specimen Description BLOOD LEFT ARM  Final   Special Requests   Final    BOTTLES DRAWN AEROBIC ONLY Blood Culture results may not be optimal due to an inadequate volume of blood received in culture bottles   Culture   Final    NO GROWTH 4 DAYS Performed at [facility], [place] [address] [place], [place] [address]    Report Status PENDING  Incomplete  Respiratory Panel by RT PCR (Flu A&B, Covid) - Nasopharyngeal Swab     Status: None   Collection Time: 08/03/19 11:50 PM   Specimen: Nasopharyngeal Swab  Result Value Ref Range Status   SARS Coronavirus 2 by RT PCR NEGATIVE NEGATIVE Final    Comment: (NOTE) SARS-CoV-2 target nucleic acids are NOT DETECTED. The SARS-CoV-2 RNA is generally detectable in upper respiratoy specimens during the acute phase of infection. The lowest concentration of SARS-CoV-2 viral copies this assay can detect is 131 copies/mL. A negative result does not preclude SARS-Cov-2 infection and should not be used as the sole basis for treatment or other patient management decisions. A negative result may occur with  improper specimen collection/handling, submission of specimen other than nasopharyngeal swab, presence of viral mutation(s) within the areas targeted by this assay, and inadequate number of viral copies (<131 copies/mL). A negative result must be combined with clinical observations, patient  history, and epidemiological information. The expected result is Negative. Fact Sheet for Patients:  PinkCheek.be Fact Sheet for Healthcare Providers:  GravelBags.it This test is not yet ap proved or cleared by the [place] and  has been authorized for detection and/or diagnosis of SARS-CoV-2 by FDA under an Emergency Use Authorization (EUA). This EUA will remain  in effect (meaning this test can be used) for the duration of the COVID-19 declaration under Section 564(b)(1) of the Act, 21 U.S.C. section 360bbb-3(b)(1), unless the authorization is terminated or revoked sooner.    Influenza A by PCR NEGATIVE NEGATIVE Final   Influenza B by PCR NEGATIVE NEGATIVE Final    Comment: (NOTE) The Xpert Xpress SARS-CoV-2/FLU/RSV assay is intended as an aid in  the diagnosis of influenza from Nasopharyngeal swab specimens and  should not be used as a sole basis for treatment. Nasal washings and  aspirates are unacceptable for Xpert Xpress SARS-CoV-2/FLU/RSV  testing. Fact Sheet for Patients: https://www.moore.com/ Fact Sheet for Healthcare Providers: https://www.young.biz/ This test is not yet approved or cleared by the [place] FDA and  has been authorized for detection and/or diagnosis of SARS-CoV-2 by  FDA under an Emergency Use Authorization (EUA). This EUA will remain  in effect (meaning this test can be used) for the duration of the  Covid-19 declaration under Section 564(b)(1) of the Act, 21  U.S.C. section 360bbb-3(b)(1), unless the authorization is  terminated or revoked. Performed at [facility], [address] [place], [place] [address]   Urine culture     Status: None   Collection Time: 08/04/19 11:18 AM   Specimen: In/Out Cath Urine  Result Value Ref Range Status   Specimen Description IN/OUT CATH URINE  Final   Special Requests NONE  Final   Culture   Final      NO GROWTH Performed at [facility], [address] [place], [place] [address]    Report Status 08/05/2019 FINAL  Final  MRSA PCR Screening     Status: None   Collection Time: 08/04/19  3:21 PM   Specimen: Nasal Mucosa; Nasopharyngeal  Result Value Ref Range Status   MRSA by PCR NEGATIVE NEGATIVE Final    Comment:        The GeneXpert MRSA Assay (FDA approved for NASAL specimens only), is one component of a comprehensive MRSA colonization surveillance program. It is not intended to diagnose MRSA infection nor to guide or monitor treatment for MRSA infections. Performed at [facility], [address] [place], [place] [address]       Studies: DG Swallowing Func-Speech Pathology  Result Date: 08/07/2019 Objective Swallowing Evaluation: Type of Study: Bedside Swallow Evaluation  Patient Details Name: MITSUO BUDNICK MRN: [id number] Date of Birth: 1941-02-07 Today's Date: 08/07/2019 Time: SLP Start Time (ACUTE ONLY): 0850 -SLP Stop Time (ACUTE ONLY): 0919 SLP Time Calculation (min) (ACUTE ONLY): 29 min Past Medical History: Past Medical History: Diagnosis Date . Bilateral carotid artery disease (HCC)  . Cardiomyopathy (HCC) 05/2019 . CHF (congestive heart failure) (HCC)  . Critical lower limb ischemia  . DVT (deep venous thrombosis) (HCC)  . Dyspnea on exertion 05/30/2019 . Hyperlipidemia   takes Atorvastatin daily . Hypertension   takes Amlodipine and Ramipril daily . Joint swelling  . Nocturia  . Peripheral arterial disease (HCC)  . Pulmonary embolism (HCC)   Refer to VQ scan.  Past Surgical History: Past Surgical History: Procedure Laterality Date . BACK SURGERY   . colonosocpy   . ENDARTERECTOMY FEMORAL Right 02/25/2015  Procedure: ENDARTERECTOMYRight  FEMORAL Endarterectomy with profundaplasty.;  Surgeon: Sherren Kerns, MD;  Location: [facility] OR;  Service: Vascular;  Laterality: Right; . FEMORAL-POPLITEAL BYPASS GRAFT Right 02/25/2015  Procedure:  BYPASS GRAFT FEMORAL-POPLITEAL  ARTERY using non-reversed saphenous vein.;  Surgeon: Sherren Kerns, MD;  Location: [facility] OR;  Service: Vascular;  Laterality: Right; . PERIPHERAL VASCULAR CATHETERIZATION N/A 01/24/2015  Procedure: Abdominal Aortogram;  Surgeon: Sherren Kerns, MD;  Location: [facility] INVASIVE CV LAB;  Service: Cardiovascular;  Laterality: N/A; HPI: 79 yo male adm to [facility] with FTT, pneunomediastinum, cavitary lung lesions, bilateral PE, now on heparin per MD notes.  Per notes, pt's daughter, wife and dog passed and concern for depression present.  Pt denies dysphagia.  Swallow evaluation ordered.  Subjective: pt awake in chair Assessment / Plan / Recommendation CHL IP CLINICAL IMPRESSIONS 08/07/2019 Clinical Impression Patient presents with sensormotor based oropharyngeal dysphagia.  Weak muscle contraction and decreased oral control results in poor oral bolus cohesion and pharyngeal retention/aspiration.  Lingual pumping observed across consistencies with delay - SLP counting 1, 2, 3 - did not appear to improve efficency.  Pt aspirated thin liquids due to decreased timing and adequacy of laryngeal closure.  Reflexive cough noted with aspiration but did not clear aspirates.  Chin tuck posture prevented aspiration but allowed laryngeal penetration of thin.  Pharyngeal retention noted across all consistencies due to weakness without adequate sensation/awareness.  Pt does intermittent propel vallecular residuals into oral cavity using tongue base and expectorate= admitting he has done this prior to admissoin.  SLP recommends to continue current diet with strict precautions and allow single tsps of thin with chin tuck (with staff only).  Pt's swallowing appears laborious for him and concern for nutritional adequacy present.  Using teach back, educated pt to findings/recommendations.  Thankful palliative is following.  Marland Kitchen SLP Visit Diagnosis Dysphagia, oropharyngeal phase (R13.12) Attention and concentration deficit following -- Frontal lobe  and executive function deficit following -- Impact on safety and function Risk for inadequate nutrition/hydration;Moderate aspiration risk   CHL IP TREATMENT RECOMMENDATION 08/07/2019 Treatment Recommendations F/U MBS in --- days (Comment);Therapy as outlined in treatment plan below;Defer until completion of intrumental exam   Prognosis 08/07/2019 Prognosis for Safe Diet Advancement Guarded Barriers to Reach Goals Time post onset;Motivation Barriers/Prognosis Comment -- CHL IP DIET RECOMMENDATION 08/07/2019 SLP Diet Recommendations Dysphagia 3 (Mech soft) solids;Nectar thick liquid Liquid Administration via Cup;Straw Medication Administration Crushed with puree Compensations Slow rate;Small sips/bites Postural Changes Seated upright at 90 degrees;Remain semi-upright after after feeds/meals (Comment)   CHL IP OTHER RECOMMENDATIONS 08/07/2019 Recommended Consults -- Oral Care Recommendations Oral care QID Other Recommendations Have oral suction available   No flowsheet data found.  CHL IP FREQUENCY AND DURATION 08/07/2019 Speech Therapy Frequency (ACUTE ONLY) min 2x/week Treatment Duration 2 weeks      CHL IP ORAL PHASE 08/07/2019 Oral Phase Impaired Oral - Pudding Teaspoon -- Oral - Pudding Cup -- Oral - Honey Teaspoon -- Oral - Honey Cup -- Oral - Nectar Teaspoon Decreased bolus cohesion;Weak lingual manipulation;Delayed oral transit;Reduced posterior propulsion Oral - Nectar Cup Decreased bolus cohesion;Weak lingual manipulation;Delayed oral transit;Reduced posterior propulsion Oral - Nectar Straw Decreased bolus cohesion;Weak lingual manipulation;Delayed oral transit;Reduced posterior propulsion Oral - Thin Teaspoon Decreased bolus cohesion;Weak lingual manipulation;Reduced posterior propulsion;Premature spillage Oral - Thin Cup Decreased bolus cohesion;Weak lingual manipulation;Delayed oral transit;Reduced posterior propulsion;Premature spillage Oral - Thin Straw Decreased bolus cohesion;Weak lingual  manipulation;Delayed oral transit;Reduced posterior propulsion Oral - Puree Decreased bolus cohesion;Weak lingual manipulation;Delayed oral transit;Reduced posterior propulsion Oral - Mech Soft Decreased bolus cohesion;Weak lingual manipulation;Delayed oral transit;Reduced posterior propulsion Oral - Regular -- Oral - Multi-Consistency -- Oral - Pill -- Oral Phase - Comment --  CHL IP PHARYNGEAL PHASE 08/07/2019 Pharyngeal Phase Impaired Pharyngeal- Pudding Teaspoon -- Pharyngeal -- Pharyngeal- Pudding Cup -- Pharyngeal -- Pharyngeal- Honey Teaspoon -- Pharyngeal -- Pharyngeal- Honey Cup -- Pharyngeal -- Pharyngeal- Nectar Teaspoon Reduced epiglottic inversion;Reduced pharyngeal peristalsis;Reduced laryngeal elevation;Pharyngeal residue - pyriform;Pharyngeal residue - valleculae Pharyngeal Material does not enter airway Pharyngeal- Nectar Cup Pharyngeal residue - valleculae;Pharyngeal residue - pyriform;Reduced pharyngeal peristalsis;Reduced epiglottic inversion;Reduced anterior laryngeal mobility;Reduced laryngeal elevation;Reduced airway/laryngeal closure;Reduced tongue base retraction Pharyngeal Material does not enter airway Pharyngeal- Nectar Straw Pharyngeal residue - valleculae;Pharyngeal residue - pyriform;Reduced pharyngeal peristalsis;Reduced epiglottic inversion;Reduced anterior laryngeal mobility;Reduced laryngeal elevation;Reduced airway/laryngeal closure Pharyngeal Material does not enter airway Pharyngeal- Thin Teaspoon Penetration/Aspiration before swallow;Trace aspiration;Pharyngeal residue - valleculae;Pharyngeal residue - pyriform Pharyngeal Material enters airway, passes BELOW cords and not ejected out despite cough attempt by patient Pharyngeal- Thin Cup Delayed swallow initiation-pyriform sinuses;Penetration/Aspiration during swallow;Trace aspiration;Pharyngeal residue - valleculae;Pharyngeal residue - pyriform Pharyngeal Material enters airway, passes BELOW cords and not ejected out despite  cough attempt by patient Pharyngeal- Thin Straw Penetration/Aspiration during swallow;Pharyngeal residue - valleculae;Pharyngeal residue - pyriform;Reduced pharyngeal peristalsis;Reduced epiglottic inversion;Reduced anterior laryngeal mobility;Reduced laryngeal elevation;Reduced airway/laryngeal closure;Reduced tongue base retraction Pharyngeal Material enters airway, remains ABOVE vocal cords and not ejected out Pharyngeal- Puree Reduced pharyngeal peristalsis;Reduced tongue base retraction;Reduced laryngeal elevation;Reduced anterior laryngeal mobility;Reduced airway/laryngeal closure;Pharyngeal residue - valleculae Pharyngeal Material does not enter airway Pharyngeal- Mechanical Soft Reduced laryngeal elevation;Reduced anterior laryngeal mobility;Reduced epiglottic inversion;Reduced pharyngeal peristalsis;Reduced airway/laryngeal closure;Reduced tongue base retraction;Pharyngeal residue - valleculae Pharyngeal Material does not enter airway Pharyngeal- Regular -- Pharyngeal -- Pharyngeal- Multi-consistency -- Pharyngeal -- Pharyngeal- Pill -- Pharyngeal -- Pharyngeal Comment chin tuck prevent aspiration but allowed laryngeal penetration of thin and pt needed total cues to perform; head turn left (pt leaning toward left) did not decrease  pharyngeal retention; pt tilts head posterior to aid oral transiting of boluses; pt propels boluses from pharynx into oral cavity and expectorates or re=swallows- stating he has done this prior to admission; pt with reflexive cough with aspiration events but cough did not clear aspirates  CHL IP CERVICAL ESOPHAGEAL PHASE 08/07/2019 Cervical Esophageal Phase Impaired Pudding Teaspoon -- Pudding Cup -- Honey Teaspoon -- Honey Cup -- Nectar Teaspoon -- Nectar Cup -- Nectar Straw -- Thin Teaspoon -- Thin Cup -- Thin Straw -- Puree -- Mechanical Soft -- Regular -- Multi-consistency -- Pill -- Cervical Esophageal Comment Prominent cricopharyngeus apparent x1 with nectar liquid did not  impair barium flow; At end of testing,pt's esophagus appeared with retention distally without pt sensation Rolena Infante, MS [facility] SLP Acute Rehab Services Office [phone] Chales Abrahams 08/07/2019, 10:00 AM               Scheduled Meds: . apixaban  10 mg Oral BID   Followed by  . [START ON 08/13/2019] apixaban  5 mg Oral BID  . atorvastatin  20 mg Oral Daily  . ferrous sulfate  325 mg Oral Q breakfast  . folic acid  1 mg Oral Daily  . potassium chloride  40 mEq Oral BID    Continuous Infusions: . lactated ringers 50 mL/hr at 08/07/19 0621  . piperacillin-tazobactam (ZOSYN)  IV 3.375 g (08/07/19 1043)     LOS: 3 days     Darlin Drop, MD [facility] Pager [phone]  If 7PM-7AM, please contact night-coverage [email or web address] Password [email or web address] 08/07/2019, 1:20 PM

## 2019-08-08 LAB — CULTURE, BLOOD (ROUTINE X 2)
Culture: NO GROWTH
Culture: NO GROWTH

## 2019-08-08 LAB — SARS CORONAVIRUS 2 (TAT 6-24 HRS): SARS Coronavirus 2: NEGATIVE

## 2019-08-08 LAB — BASIC METABOLIC PANEL
Anion gap: 11 (ref 5–15)
BUN: 18 mg/dL (ref 8–23)
CO2: 29 mmol/L (ref 22–32)
Calcium: 9 mg/dL (ref 8.9–10.3)
Chloride: 102 mmol/L (ref 98–111)
Creatinine, Ser: 1.48 mg/dL — ABNORMAL HIGH (ref 0.61–1.24)
GFR calc Af Amer: 52 mL/min — ABNORMAL LOW (ref >60)
GFR calc non Af Amer: 45 mL/min — ABNORMAL LOW (ref >60)
Glucose, Bld: 100 mg/dL — ABNORMAL HIGH (ref 70–99)
Potassium: 3.8 mmol/L (ref 3.5–5.1)
Sodium: 142 mmol/L (ref 135–145)

## 2019-08-08 MED ORDER — SERTRALINE HCL 25 MG PO TABS
25.0000 mg | ORAL_TABLET | Freq: Every day | ORAL | 0 refills | Status: DC
Start: 2019-08-08 — End: 2019-08-14

## 2019-08-08 MED ORDER — ENSURE ENLIVE PO LIQD: 237.0000 mL | Freq: Three times a day (TID) | ORAL | 0 refills | Status: DC

## 2019-08-08 MED ORDER — ENSURE ENLIVE PO LIQD: 237.0000 mL | Freq: Three times a day (TID) | ORAL | Status: DC

## 2019-08-08 NOTE — Progress Notes (Signed)
Occupational Therapy Treatment Patient Details Name: Kevin Reilly MRN: [id number] DOB: 07-21-1940 Today's Date: 08/08/2019    History of present illness Pt is a 79 yo male s/p AMS, found down by caregiver. CT scan of the C spine showed superior pneumomediastinum.  CT scan of the chest showed bilateral pulmonary embolism, left upper lobe cavitary lesion. pMHx: CAD, CHF, PVD, HTN.   OT comments  Pt progressing well toward stated goals, focused session on BADL mobility progression and cognition. Pt completed bed mobility with min A and cues for sequencing. He then completed stand pivot transfer to recliner with mod A with safety cues. Pt impulsive throughout session, pulling off gown and pulling off leads- easily redirected. Pt improves when given meaningful task to engage in. Had pt perform tasks with posey belt, where he followed 100% of one step commands (5 total commands). He then was given papers to "file" (hx of working in [employer]) where he was folding papers and placing them in the pockets of the posey belt. D/c recs remain appropriate. Will continue to follow.    Follow Up Recommendations  SNF;Supervision/Assistance - 24 hour    Equipment Recommendations  Other (comment)(defer to next venue)    Recommendations for Other Services      Precautions / Restrictions Precautions Precautions: Fall Restrictions Weight Bearing Restrictions: No       Mobility Bed Mobility Overal bed mobility: Needs Assistance Bed Mobility: Supine to Sit     Supine to sit: Min assist;HOB elevated     General bed mobility comments: min A to elevate trunk and overall sequencing of transfer  Transfers Overall transfer level: Needs assistance Equipment used: 1 person hand held assist Transfers: Sit to/from Stand Sit to Stand: Mod assist         General transfer comment: mod A to pivot over to recliner with small pivotal steps    Balance Overall balance assessment: Needs  assistance Sitting-balance support: Feet supported Sitting balance-Leahy Scale: Fair     Standing balance support: Bilateral upper extremity supported Standing balance-Leahy Scale: Poor Standing balance comment: bil HHA                           ADL either performed or assessed with clinical judgement   ADL Overall ADL's : Needs assistance/impaired Eating/Feeding: Set up;Sitting Eating/Feeding Details (indicate cue type and reason): needs initiation cues due to cognitive status                     Toilet Transfer: Moderate assistance;Squat-pivot;BSC;Cueing for safety;Cueing for sequencing Toilet Transfer Details (indicate cue type and reason): simulated to recliner, mod A to rise and stedy with small pivotal steps to recliner. Increased cueing to manage safety and impulsivity         Functional mobility during ADLs: Moderate assistance;Cueing for safety;Cueing for sequencing       Vision Baseline Vision/History: Wears glasses Wears Glasses: At all times Patient Visual Report: No change from baseline Vision Assessment?: No apparent visual deficits   Perception     Praxis      Cognition Arousal/Alertness: Awake/alert Behavior During Therapy: Impulsive;Flat affect Overall Cognitive Status: Impaired/Different from baseline Area of Impairment: Orientation;Attention;Memory;Following commands;Safety/judgement;Problem solving                 Orientation Level: Disoriented to;Time;Situation Current Attention Level: Sustained Memory: Decreased short-term memory;Decreased recall of precautions Following Commands: Follows one step commands with increased time Safety/Judgement: Decreased awareness of  safety;Decreased awareness of deficits   Problem Solving: Slow processing;Decreased initiation;Difficulty sequencing;Requires verbal cues;Requires tactile cues General Comments: pt needing short simple cues to engage in tasks, overall is impulsive. Pt taking  off gown and leads during session, but easily redirected to put them back on. Pt given posey belt and "papers to file". Improved when engaged in meaningful task        Exercises     Shoulder Instructions       General Comments      Pertinent Vitals/ Pain       Pain Assessment: No/denies pain  Home Living                                          Prior Functioning/Environment              Frequency  Min 2X/week        Progress Toward Goals  OT Goals(current goals can now be found in the care plan section)  Progress towards OT goals: Progressing toward goals  Acute Rehab OT Goals OT Goal Formulation: Patient unable to participate in goal setting Time For Goal Achievement: 08/19/19 Potential to Achieve Goals: Good  Plan Discharge plan remains appropriate    Co-evaluation                 AM-PAC OT "6 Clicks" Daily Activity     Outcome Measure     Help from another person taking care of personal grooming?: A Little Help from another person toileting, which includes using toliet, bedpan, or urinal?: A Lot Help from another person bathing (including washing, rinsing, drying)?: A Lot Help from another person to put on and taking off regular upper body clothing?: A Lot Help from another person to put on and taking off regular lower body clothing?: A Lot 6 Click Score: 11    End of Session Equipment Utilized During Treatment: Gait belt  OT Visit Diagnosis: Unsteadiness on feet (R26.81);Muscle weakness (generalized) (M62.81);Other symptoms and signs involving cognitive function   Activity Tolerance Patient tolerated treatment well   Patient Left in chair;with call bell/phone within reach;with chair alarm set;with nursing/sitter in room   Nurse Communication Mobility status        Time: 7425-9563 OT Time Calculation (min): 20 min  Charges: OT General Charges $OT Visit: 1 Visit OT Treatments $Self Care/Home Management : 8-22  mins  Zenovia Jarred, MSOT, OTR/L [facility] Office Number: [phone] Pager: [phone]  Zenovia Jarred 08/08/2019, 2:14 PM

## 2019-08-08 NOTE — TOC Transition Note (Addendum)
Transition of Care [facility]) - CM/SW Discharge Note   Patient Details  Name: Kevin Reilly MRN: [id number] Date of Birth: [birth year]  [facility]) CM/SW Contact:  [name], LCSW Phone Number: 08/08/2019, 3:02 PM   Clinical Narrative:     Patient will DC to: [facility] Anticipated DC date: 08/08/19 Family notified: [name] Transport [id number]  Per MD patient ready for DC to [place] . RN, patient, patient's family, and facility notified of DC. Discharge Summary sent to facility. RN given number for report  [phone]. DC packet on chart. Ambulance transport requested for patient.  CSW signing off.  Referral made to Chales Abrahams with [facility] for outpatient palliative services, CSW also relayed urology follow up appointment to [facility] for their records, patient will be seen by Dr. Sherron Monday on 5/26 at 9:30 am [phone]).  [place], [place] [phone]   Final next level of care: Skilled Nursing Facility Barriers to Discharge: No Barriers Identified   Patient Goals and CMS Choice   CMS Medicare.gov Compare Post Acute Care list provided to:: Patient Represenative (must comment)(brother [name]) Choice offered to / list presented to : Sibling  Discharge Placement PASRR number recieved: 08/07/19            Patient [facility] bed at: [facility] Patient to be transferred to facility by: [facility] Name of family member notified: [name] Patient and family notified of of transfer: 08/08/19  Discharge Plan and Services     Post Acute Care Choice: Skilled Nursing Facility                               Social Determinants of Health (SDOH) Interventions     Readmission Risk Interventions No flowsheet data found.
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
## 2019-08-08 NOTE — Consult Note (Signed)
[facility] Face-to-Face Psychiatry Consult   Reason for Consult:  "concern for underlying depression"  Referring Physician:  Dr. Margo Aye Patient Identification: Kevin Reilly MRN:  [id number] Principal Diagnosis: Pneumomediastinum [facility]) Diagnosis:  Principal Problem:   Pneumomediastinum (HCC) Active Problems:   Pulmonary embolism (HCC)   Cavitary lesion of lung   Acute respiratory failure with hypoxemia (HCC)   Acute metabolic encephalopathy   Palliative care by specialist   Goals of care, counseling/discussion   DNR (do not resuscitate)   Total Time spent with patient: 20 minutes  Subjective:   Kevin Reilly is a 79 y.o. male patient.  Patient assessed by nurse practitioner.  Patient resting upon my approach.  Patient difficult to arouse, RN assisted. Patient alert and oriented, participates in assessment with prompting. Patient reports "I have been sick for about 3 months."  Patient also reports death of wife approximately 4 months ago and daughter's death approximately 3 months ago. Patient denies suicidal and homicidal ideations.  Patient denies history of self-harm, denies history of suicide attempts.  Patient denies auditory and visual hallucinations.  Patient denies symptoms of paranoia. Patient reports he lives alone in [place].  Patient denies access to weapons.  Patient denies alcohol and substance use.  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 Patient endorses sleep is "good."  Patient reports "my appetite is 0."  HPI: Patient admitted with altered mental status.  Past Psychiatric History: NA  Risk to Self:  Denies Risk to Others:  Denies Prior Inpatient Therapy:  Denies Prior Outpatient Therapy:  Denies  Past Medical History:  Past Medical History:  Diagnosis Date  . Bilateral carotid artery disease (HCC)   . Cardiomyopathy (HCC) 05/2019  . CHF (congestive heart failure) (HCC)   . Critical lower limb ischemia    . DVT (deep venous thrombosis) (HCC)   . Dyspnea on exertion 05/30/2019  . Hyperlipidemia    takes Atorvastatin daily  . Hypertension    takes Amlodipine and Ramipril daily  . Joint swelling   . Nocturia   . Peripheral arterial disease (HCC)   . Pulmonary embolism (HCC)    Refer to VQ scan.     Past Surgical History:  Procedure Laterality Date  . BACK SURGERY    . colonosocpy    . ENDARTERECTOMY FEMORAL Right 02/25/2015   Procedure: ENDARTERECTOMYRight  FEMORAL Endarterectomy with profundaplasty.;  Surgeon: Sherren Kerns, MD;  Location: [facility] OR;  Service: Vascular;  Laterality: Right;  . FEMORAL-POPLITEAL BYPASS GRAFT Right 02/25/2015   Procedure: BYPASS GRAFT FEMORAL-POPLITEAL ARTERY using non-reversed saphenous vein.;  Surgeon: Sherren Kerns, MD;  Location: [facility] OR;  Service: Vascular;  Laterality: Right;  . PERIPHERAL VASCULAR CATHETERIZATION N/A 01/24/2015   Procedure: Abdominal Aortogram;  Surgeon: Sherren Kerns, MD;  Location: [facility] INVASIVE CV LAB;  Service: Cardiovascular;  Laterality: N/A;   Family History: History reviewed. No pertinent family history. Family Psychiatric  History: Denies Social History:  Social History   Substance and Sexual Activity  Alcohol Use No  . Alcohol/week: 0.0 standard drinks     Social History   Substance and Sexual Activity  Drug Use No    Social History   Socioeconomic History  . Marital status: Widowed    Spouse name: Not on file  . Number of children: 1  . Years of education: Not on file  . Highest education level: Not on file  Occupational History  . Not on file  Tobacco Use  . Smoking status: Former Smoker    Types: Cigarettes  . Smokeless tobacco: Never Used  Substance and Sexual Activity  . Alcohol use: No    Alcohol/week: 0.0 standard drinks  . Drug use: No  . Sexual activity: Not Currently    Birth control/protection: None  Other Topics Concern  . Not on file  Social History Narrative   1 deceased child    Social Determinants of Health   Financial Resource Strain:   . Difficulty of Paying Living Expenses:   Food Insecurity:   . Worried About Programme researcher, broadcasting/film/video in the Last Year:   . Barista in the Last Year:   Transportation Needs:   . Freight forwarder (Medical):   Marland Kitchen Lack of Transportation (Non-Medical):   Physical Activity:   . Days of Exercise per Week:   . Minutes of Exercise per Session:   Stress:   . Feeling of Stress :   Social Connections:   . Frequency of Communication with Friends and Family:   . Frequency of Social Gatherings with Friends and Family:   . Attends Religious Services:   . Active Member of Clubs or Organizations:   . Attends Banker Meetings:   Marland Kitchen Marital Status:    Additional Social History:    Allergies:   Allergies  Allergen Reactions  . Chocolate Hives  . Sulfur Rash    Labs:  Results for orders placed or performed during the hospital encounter of 08/03/19 (from the past 48 hour(s))  Basic metabolic panel     Status: Abnormal   Collection Time: 08/07/19  2:18 AM  Result Value Ref Range   Sodium 141 135 - 145 mmol/L   Potassium 3.3 (L) 3.5 - 5.1 mmol/L   Chloride 102 98 - 111 mmol/L   CO2 30 22 - 32 mmol/L   Glucose, Bld 94 70 - 99 mg/dL    Comment: Glucose reference range applies only to samples taken after fasting for at least 8 hours.   BUN 31 (H) 8 - 23 mg/dL   Creatinine, Ser 2.67 (H) 0.61 - 1.24 mg/dL   Calcium 8.7 (L) 8.9 - 10.3 mg/dL   GFR calc non Af Amer 48 (L) >60 mL/min   GFR calc Af Amer 56 (L) >60 mL/min   Anion gap 9 5 - 15    Comment: Performed at [facility], [address], [place], [place] [address]  CBC     Status: Abnormal   Collection Time: 08/07/19  2:18 AM  Result Value Ref Range   WBC 11.5 (H) 4.0 - 10.5 K/uL   RBC 3.39 (L) 4.22 - 5.81 MIL/uL   Hemoglobin 9.5 (L) 13.0 - 17.0 g/dL   HCT 09.9 (L) 83.3 - 82.5 %   MCV 92.0 80.0 - 100.0 fL   MCH 28.0 26.0 - 34.0 pg   MCHC 30.4  30.0 - 36.0 g/dL   RDW 05.3 (H) 97.6 - 73.4 %   Platelets 332 150 - 400 K/uL   nRBC 0.0 0.0 - 0.2 %    Comment: Performed at [facility], [address] [place], [place] [address]  Basic metabolic panel     Status: Abnormal   Collection Time: 08/08/19  8:15 AM  Result Value Ref Range   Sodium 142 135 - 145 mmol/L   Potassium 3.8 3.5 - 5.1 mmol/L   Chloride 102 98 - 111 mmol/L   CO2 29 22 -  32 mmol/L   Glucose, Bld 100 (H) 70 - 99 mg/dL    Comment: Glucose reference range applies only to samples taken after fasting for at least 8 hours.   BUN 18 8 - 23 mg/dL   Creatinine, Ser 0.71 (H) 0.61 - 1.24 mg/dL   Calcium 9.0 8.9 - 21.9 mg/dL   GFR calc non Af Amer 45 (L) >60 mL/min   GFR calc Af Amer 52 (L) >60 mL/min   Anion gap 11 5 - 15    Comment: Performed at [facility], [address] [place], [place] [address]    Current Facility-Administered Medications  Medication Dose Route Frequency Provider Last Rate Last Admin  . acetaminophen (TYLENOL) tablet 650 mg  650 mg Oral Q6H PRN John Giovanni, MD       Or  . acetaminophen (TYLENOL) suppository 650 mg  650 mg Rectal Q6H PRN John Giovanni, MD      . apixaban Everlene Balls) tablet 10 mg  10 mg Oral BID Earnie Larsson, RPH   10 mg at 08/08/19 2549   Followed by  . [START ON 08/13/2019] apixaban (ELIQUIS) tablet 5 mg  5 mg Oral BID Earnie Larsson, [facility]      . atorvastatin (LIPITOR) tablet 20 mg  20 mg Oral Daily Dow Adolph N, DO   20 mg at 08/08/19 8264  . feeding supplement (ENSURE ENLIVE) (ENSURE ENLIVE) liquid 237 mL  237 mL Oral TID BM Alita Chyle, NP      . ferrous sulfate tablet 325 mg  325 mg Oral Q breakfast Dow Adolph N, DO   325 mg at 08/07/19 1583  . folic acid (FOLVITE) tablet 1 mg  1 mg Oral Daily [place], [place] Derry N, DO   1 mg at 08/08/19 0940  . piperacillin-tazobactam (ZOSYN) IVPB 3.375 g  3.375 g Intravenous Q8H Dorcas Carrow, MD 12.5 mL/hr at 08/08/19 0903 3.375 g at 08/08/19 0903  . Resource  ThickenUp Clear   Oral PRN Darlin Drop, DO        Musculoskeletal: Strength & Muscle Tone: decreased Gait & Station: unable to assess Patient leans: N/A  Psychiatric Specialty Exam: Physical Exam  Nursing note and vitals reviewed. Constitutional: He is oriented to person, place, and time. He appears well-developed.  HENT:  Head: Normocephalic.  Cardiovascular: Normal rate.  Respiratory: Effort normal.  Neurological: He is alert and oriented to person, place, and time.    Review of Systems  Blood pressure (!) 145/75, pulse 80, temperature 97.6 F (36.4 C), temperature source Axillary, resp. rate 17, height 5\' 8"  (1.727 m), weight 52.4 kg, SpO2 96 %.Body mass index is 17.56 kg/m.  General Appearance: Casual and Fairly Groomed  Eye Contact:  Fair  Speech:  Clear and Coherent  Volume:  Decreased  Mood:  Depressed  Affect:  Depressed  Thought Process:  Coherent, Goal Directed and Descriptions of Associations: Intact  Orientation:  Full (Time, Place, and Person)  Thought Content:  WDL and Logical  Suicidal Thoughts:  No  Homicidal Thoughts:  No  Memory:  Immediate;   Good  Judgement:  Fair  Insight:  Good  Psychomotor Activity:  Normal  Concentration:  Concentration: Good and Attention Span: Good  Recall:  Good  Fund of Knowledge:  Good  Language:  Good  Akathisia:  No  Handed:  Right  AIMS (if indicated):     Assets:  Communication Skills Desire for Improvement Financial Resources/Insurance Housing Leisure Time Resilience Social Support  ADL's:  Impaired  Cognition:  WNL  Sleep:         Treatment Plan Summary: Patient discussed with Dr. Dwyane Dee. Plan Recommend consider Zoloft 25 mg daily to address mood.    Disposition: No evidence of imminent risk to self or others at present.   Patient does not meet criteria for psychiatric inpatient admission. Supportive therapy provided about ongoing stressors.  Emmaline Kluver, FNP 08/08/2019 2:52 PM

## 2019-08-08 NOTE — Discharge Summary (Addendum)
Discharge Summary  Kevin Reilly ZOX:[id number] DOB: 1940/11/15  PCP: Patient, No Pcp Per  Admit date: 08/03/2019 Discharge date: 08/08/2019  Time spent: 35 minutes  Recommendations for Outpatient Follow-up:  1. Follow-up with your cardiologist 2. Follow-up with pulmonary 3. Follow-up with urology Dr. Sherron Monday for voiding trial: Appointment has been made for you: 09/05/19 at 9:30 AM. 4. Follow-up with your primary care provider 5. Take your medications as prescribed 6. Continue PT OT with assistance and fall precautions  Discharge Diagnoses:  Active Hospital Problems   Diagnosis Date Noted  . Pneumomediastinum (HCC) 08/04/2019  . Pulmonary embolism (HCC) 08/04/2019  . Cavitary lesion of lung 08/04/2019  . Acute respiratory failure with hypoxemia (HCC) 08/04/2019  . Acute metabolic encephalopathy 08/04/2019  . Palliative care by specialist   . Goals of care, counseling/discussion   . DNR (do not resuscitate)     Resolved Hospital Problems  No resolved problems to display.    Discharge Condition:  Stable  Diet recommendation:  Swallow Evaluation Recommendations  SLP Diet Recommendations: Dysphagia 3 (Mech soft) solids;Nectar thick liquid(tsps thin with chin tuck, ice chips ok)  Liquid Administration via: Cup;Straw  Medication Administration: Crushed with puree  Supervision: Patient able to self feed  Compensations: Slow rate;Small sips/bites  Postural Changes: Seated upright at 90 degrees;Remain semi-upright after after feeds/meals (Comment)  Oral Care Recommendations: Oral care QID  Other Recommendations: Have oral suction available(cue pt to expectorate during meal intermittently)    Vitals:   08/08/19 0737 08/08/19 1145  BP: (!) 145/75   Pulse: 87 80  Resp: 17 17  Temp: (!) 97.3 F (36.3 C) 97.6 F (36.4 C)  SpO2: 96% 96%    History of present illness:  Kevin Reilly a 79 y.o.malewith medical history significant ofbilateral carotid artery  disease, CHF, PAD, DVT/PE on Eliquis, hypertension, hyperlipidemia, presenting via EMS for evaluation of altered mental status.  Caregiver found him laying on the floor and confused after several days.  Had a fall last week.  Lives alone.  Has lost his wife, daughter and dog.  Poor social support.  ED Course: SARS-CoV-2 PCR test negative.  CT head nonacute.  CT chest with contrast showing bilateral pulmonary embolism, large amount of pneumomediastinum, suspected cavitary pneumonia, cavitary neoplasm cannot be excluded.  TRH was asked to admit.  PCCM consulted due to pneumomediastinum and cavitary lesions on CT chest, following.  08/07/19: Seen and examined.  More interactive this morning.  He has no new complaints.  Denies chest pain, dyspnea or cough.  2D echo completed on 08/06/2019 showing LVEF 25-30% with grade 1DD.  Will follow up with his cardiologist Dr. Jacinto Halim outpatient after discharge to SNF.   Hospital Course:  Principal Problem:   Pneumomediastinum (HCC) Active Problems:   Pulmonary embolism (HCC)   Cavitary lesion of lung   Acute respiratory failure with hypoxemia (HCC)   Acute metabolic encephalopathy   Palliative care by specialist   Goals of care, counseling/discussion   DNR (do not resuscitate)   Acute metabolic encephalopathy likely multifactorial secondary to severe dehydration, acute infective process CT head was nonacute Alert and oriented x1 Reorient as needed No focal neuro deficit  Large pneumomediastinum: Suspect trauma related.   No concurrent pneumothorax O2 saturations stable on room air 96%. Follow-up with pulmonary  Bilateral pulmonary emboli in the setting of previous DVT: Patient has a history of DVT/PE and is on Eliquis.  Could be due to Eliquis failure versus noncompliance.  Heparin drip discontinued 08/06/19, no further  planned procedures 2D echo completed LVEF 25 to 30% with grade 1 diastolic dysfunction  Suspected Cavitary Pneumonia,  community acquired: CT showing9.2 cm x 4.4 cm x 4.2 cm lobulated, thick walled left upper lobe cavitary lesion. CT also showing additional smaller right middle lobe cavitary lesion with a 9 mm noncalcified right lower lobe lung nodule. Pulmonary feels cavitary lesions are possibly secondary to prior PE with infarction versus necrotizing infection, less likely TB. Completed Zosyn day number 5 out of 5 Procalcitonin trending down 0.1 from 0.28 from 0.49 Blood cultures negative FINAL Unable to obtain sputum culture due to no cough Will need to repeat CT scan in 6 weeks to monitor findings noted above Follow-up with pulmonary  Chronic Combined diastolic and systolic CHF LVEF 25-30% with [id number] Euvolemic on exam Will follow up with his cardiologist Dr. Jacinto Halim outpatient, discussed with cardiology. Diuretics on hold due to AKI Continue strict I's and O's and daily weights Net I&O +1.5L  AKI prerenal in the setting of severe dehydration from poor oral intake Hypovolemic on exam, BNP 94 (08/03/19) from 986 (07/09/19) Baseline creatinine appears to be 0.8 with GFR greater than 60 Creatinine 1.4 from 1.3 from 1.7 from 1.88 Continue to avoid nephrotoxins Has received IV fluids which were discontinued 08/07/19.  Acute urinary retention Foley cath inserted on 08/04/2019> Dced and replaced on 08/08/19 Follow-up with urology Dr. Sherron Monday for voiding trial: Appointment has been made for you: 09/05/19 at 9:30 AM.  Dysphagia/dehydration/poor oral intake Seen by speech therapy with recommendation for mechanical soft nectar thick liquid Continue to encourage oral intake as tolerated with aspiration precautions Follow speech therapist recommendations.  Concern for underlying depression Difficult to assess as the patient is alert but confused Has lost his wife, daughter, and dog. Psych consulted for recommendations Psych recommended Zoloft 25 mg daily Follow up with psych Berneice Heinrich FNP    Resolved post repletion: Hypokalemia  Potassium 3.4> 3.7> 3.3> 3.8. Magnesium 1.8  Resolved non AG metabolic acidosis Received IV bicarb  Resolved acute hypoxemic respiratory failure secondary to above:  O2 saturation 96% on room air  Chronic normocytic Anemia: Hemoglobin 9.3> 8.7> 9.5 MCV 92 No overt bleeding Iron studies suggestive of iron deficiency and folate deficiency Continue iron supplement and folate supplement   DVT prophylaxis: Eliquis  Code Status:DNR    Consults called:PCCM   Discharge Exam: BP (!) 145/75 (BP Location: Right Arm)   Pulse 80   Temp 97.6 F (36.4 C) (Axillary)   Resp 17   Ht 5\' 8"  (1.727 m)   Wt 52.4 kg   SpO2 96%   BMI 17.56 kg/m  . General: 79 y.o. year-old male well developed well nourished in no acute distress.  Alert and oriented x1. . Cardiovascular: Regular rate and rhythm with no rubs or gallops.  Marland Kitchen Respiratory: Mild rales with no wheezing noted.   . Abdomen: Soft nontender nondistended with normal bowel sounds. . Musculoskeletal: No lower extremity edema bilaterally. Marland Kitchen Psychiatry: Unable to assess mood due to confusion.  Discharge Instructions You were cared for by a hospitalist during your hospital stay. If you have any questions about your discharge medications or the care you received while you were in the hospital after you are discharged, you can call the unit and asked to speak with the hospitalist on call if the hospitalist that took care of you is not available. Once you are discharged, your primary care physician will handle any further medical issues. Please note that NO REFILLS for any discharge  medications will be authorized once you are discharged, as it is imperative that you return to your primary care physician (or establish a relationship with a primary care physician if you do not have one) for your aftercare needs so that they can reassess your need for medications and monitor your lab  values.   Allergies as of 08/08/2019      Reactions   Chocolate Hives   Sulfur Rash      Medication List    STOP taking these medications   acetaminophen 500 MG tablet Commonly known as: TYLENOL   amLODipine 5 MG tablet Commonly known as: NORVASC   Apixaban Starter Pack (10mg  and 5mg ) Commonly known as: ELIQUIS STARTER PACK Replaced by: apixaban 5 MG Tabs tablet   furosemide 20 MG tablet Commonly known as: LASIX   metolazone 2.5 MG tablet Commonly known as: ZAROXOLYN   potassium chloride 10 MEQ tablet Commonly known as: KLOR-CON   potassium chloride SA 20 MEQ tablet Commonly known as: KLOR-CON   ramipril 10 MG capsule Commonly known as: ALTACE     TAKE these medications   albuterol 108 (90 Base) MCG/ACT inhaler Commonly known as: VENTOLIN HFA Inhale 2 puffs into the lungs every 4 (four) hours as needed for wheezing or shortness of breath.   apixaban 5 MG Tabs tablet Commonly known as: ELIQUIS Take 2 tablets (10 mg total) by mouth 2 (two) times daily. Take 10 mg BID x 7 days (08/06/19-08/12/19) then Take 5 mg BID indefinitely Replaces: Apixaban Starter Pack (10mg  and 5mg )   aspirin 81 MG chewable tablet Chew 81 mg by mouth daily.   atorvastatin 20 MG tablet Commonly known as: LIPITOR Take 1 tablet (20 mg total) by mouth daily.   feeding supplement (ENSURE ENLIVE) Liqd Take 237 mLs by mouth 3 (three) times daily between meals for 7 days.   ferrous sulfate 325 (65 FE) MG tablet Take 1 tablet (325 mg total) by mouth daily with breakfast.   folic acid 1 MG tablet Commonly known as: FOLVITE Take 1 tablet (1 mg total) by mouth daily.   Resource ThickenUp Clear Powd Take 1,200 g by mouth as needed.   sertraline 25 MG tablet Commonly known as: Zoloft Take 1 tablet (25 mg total) by mouth daily.            Durable Medical Equipment  (From admission, onward)         Start     Ordered   08/05/19 1628  For home use only DME Walker rolling  Once     Question Answer Comment  Walker: With 5 Inch Wheels   Patient needs a walker to treat with the following condition Ambulatory dysfunction      08/05/19 1628         Allergies  Allergen Reactions  . Chocolate Hives  . Sulfur Rash    Contact information for follow-up providers    , MD Follow up.   Specialty: Cardiology Why: Please call for a post hospital follow up appointment Contact information: [address] [place] 08/07/19 [place] [phone]        [place], MD. Call in 1 day(s).   Specialty: Pulmonary Disease Why: Please call for a post hospital follow up appointment. Contact information: [address] [place] [phone] Oretha Milch [phone]        [place], MD Follow up.   Specialty: Urology Why: [phone], option 6. you have an appointment: 09/05/19 at 9:30AM  Contact information: [address] [place] [address] [phone]            Contact information for after-discharge care    Destination    [facility] .   Service: Skilled Nursing Contact information: [address] [place] [address] [phone]                   The results of significant diagnostics from this hospitalization (including imaging, microbiology, ancillary and laboratory) are listed below for reference.    Significant Diagnostic Studies: CT Head Wo Contrast  Result Date: 08/04/2019 CLINICAL DATA:  Lying on the floor. EXAM: CT HEAD WITHOUT CONTRAST TECHNIQUE: Contiguous axial images were obtained from the base of the skull through the vertex without intravenous contrast. COMPARISON:  None. FINDINGS: Brain: There is mild cerebral atrophy with widening of the extra-axial spaces and ventricular dilatation. There are areas of decreased attenuation within the white matter tracts of the supratentorial brain, consistent with microvascular disease changes. Vascular: No hyperdense vessel  or unexpected calcification. Skull: Normal. Negative for fracture or focal lesion. Sinuses/Orbits: No acute finding. Other: None. IMPRESSION: 1. Generalized cerebral atrophy. 2. No acute intracranial abnormality. Electronically Signed   By: Aram Candela M.D.   On: 08/04/2019 02:11   CT Chest W Contrast  Result Date: 08/04/2019 CLINICAL DATA:  Found on the floor. EXAM: CT CHEST WITH CONTRAST TECHNIQUE: Multidetector CT imaging of the chest was performed during intravenous contrast administration. CONTRAST:  75mL OMNIPAQUE IOHEXOL 300 MG/ML  SOLN COMPARISON:  None. FINDINGS: Cardiovascular: A moderate amount of intraluminal low attenuation is seen involving multiple lower lobe branches of the right pulmonary artery (axial CT images 92 through 99, CT series number 4). A mild amount of intraluminal low attenuation is also seen involving several upper lobe branches of the left pulmonary artery (axial CT images 82 through 86, CT series number 4). There is marked severity calcification of the thoracic aorta without evidence of aneurysmal dilatation or dissection. Normal heart size. No pericardial effusion. Marked severity coronary artery calcification is seen. Mediastinum/Nodes: A large amount of air is seen throughout the mediastinum. This is seen at the level of the lung bases and extends superiorly to involve the soft tissues posterior and lateral to the oropharynx. Lungs/Pleura: There is moderate to marked severity emphysematous lung disease. A 9.2 cm x 4.4 cm x 4.2 cm lobulated, thick walled cavitary lesion is seen along the posterolateral aspect of the left upper lobe. A 2.9 cm x 1.1 cm x 1.4 cm thin walled cavitary lesion is seen along the posterolateral aspect of the right middle lobe. A 9 mm noncalcified lung nodule is seen within the posterior aspect of the right lower lobe (axial CT image 93, CT series number 5). Upper Abdomen: A small amount of air density is seen posterior to the infrarenal abdominal  aorta (axial CT images 153 through 157, CT series number 4), likely consistent with extension of the previously noted pneumomediastinum. Musculoskeletal: Multilevel degenerative changes seen throughout the thoracic spine. IMPRESSION: 1. Large amount of pneumomediastinum which extends superiorly to involve the soft tissues posterior and lateral to the oropharynx. 2. Mild to moderate severity bilateral pulmonary embolism, right greater than left. 3. 9.2 cm x 4.4 cm x 4.2 cm lobulated, thick walled left upper lobe cavitary lesion. This may represent a large cavitary pneumonia, however, a cavitary neoplasm cannot be excluded. 4. Additional smaller right middle lobe cavitary lesion with a 9 mm noncalcified right lower  lobe lung nodule. Attention on follow-up is recommended. 5. Moderate to marked severity emphysematous lung disease. Aortic Atherosclerosis (ICD10-I70.0) and Emphysema (ICD10-J43.9). Electronically Signed   By: Aram Candela M.D.   On: 08/04/2019 02:40   CT Cervical Spine Wo Contrast  Result Date: 08/04/2019 CLINICAL DATA:  Found on the floor. EXAM: CT CERVICAL SPINE WITHOUT CONTRAST TECHNIQUE: Multidetector CT imaging of the cervical spine was performed without intravenous contrast. Multiplanar CT image reconstructions were also generated. COMPARISON:  July 09, 2019 FINDINGS: Alignment: There is approximately 2 mm anterolisthesis of the C4 on C5 vertebral body, with approximately 3 mm anterolisthesis of the C5 on C6 vertebral body. 3 mm anterolisthesis of C7 on T1 is also noted. Skull base and vertebrae: No acute fracture. No primary bone lesion or focal pathologic process. Soft tissues and spinal canal: No prevertebral fluid or swelling. No visible canal hematoma. Disc levels: Moderate severity endplate sclerosis is seen at the levels of C4-C5, C5-C6 and C6-C7. Moderate to marked severity intervertebral disc space narrowing is also seen at these levels. Marked severity posterior intervertebral  disc space narrowing is seen at the level of C3-C4. There is marked severity bilateral multilevel facet joint hypertrophy. Upper chest: A marked amount of air is seen within the visualized portion of the superior mediastinum. This extends superiorly to involve the soft tissues along the posterior and posterolateral aspect of the a moderate amount of soft tissue air is also seen along the supraclavicular region on the left. Oropharynx. Other: None. IMPRESSION: 1. Marked severity superior pneumomediastinum which extends superiorly to involve the soft tissues along the posterior and posterolateral aspect of the left supraclavicular region on the left. Further evaluation with a chest CT is recommended. 2. Marked severity multilevel degenerative changes, most prominent at the levels of C4-C5, C5-C6 and C6-C7. 3. 2 mm anterolisthesis of the C4 on C5 vertebral body, C5-C6 vertebral body, and C7-T1 vertebral bodies. Electronically Signed   By: Aram Candela M.D.   On: 08/04/2019 02:19   DG Chest Port 1 View  Result Date: 08/03/2019 CLINICAL DATA:  Altered mental status. EXAM: PORTABLE CHEST 1 VIEW COMPARISON:  Chest radiograph 07/09/2019. No prior chest CT. FINDINGS: Persistent airspace disease in the periphery of the left mid lung, now with air-filled component concerning for cavitation. Patchy opacities in the right lung have improved from prior exam. Normal heart size with unchanged mediastinal contours. Short lucency outlining the aortic arch was not seen on prior. Vertically oriented lucency projecting over the central left lung base. There is air in the left supraclavicular soft tissues. Possible tiny left pneumothorax. No significant pleural fluid. Underlying emphysema. No acute rib fractures are visualized. IMPRESSION: 1. Persistent airspace disease in the periphery of the left mid lung, now with air-filled component concerning for cavitation. Bilateral patchy lung opacities have otherwise improved from  radiograph last month. 2. Multiple lucencies in the left hemithorax are subtle but suspicious for pneumothorax, including adjacent to the aortic arch, medial left lung base, and possibly left apex. 3. Soft tissue air in the left supraclavicular soft tissues. 4. Recommend further characterization with chest CT. These results were called by telephone at the time of interpretation on 08/03/2019 at 10:46 pm to PA [facility] , who verbally acknowledged these results. Electronically Signed   By: Narda Rutherford M.D.   On: 08/03/2019 22:47   DG Swallowing Func-Speech Pathology  Result Date: 08/07/2019 Objective Swallowing Evaluation: Type of Study: Bedside Swallow Evaluation  Patient Details Name: Kevin Reilly MRN: [id number]  Date of Birth: 1941-02-27 Today's Date: 08/07/2019 Time: SLP Start Time (ACUTE ONLY): 0850 -SLP Stop Time (ACUTE ONLY): 0919 SLP Time Calculation (min) (ACUTE ONLY): 29 min Past Medical History: Past Medical History: Diagnosis Date . Bilateral carotid artery disease (HCC)  . Cardiomyopathy (HCC) 05/2019 . CHF (congestive heart failure) (HCC)  . Critical lower limb ischemia  . DVT (deep venous thrombosis) (HCC)  . Dyspnea on exertion 05/30/2019 . Hyperlipidemia   takes Atorvastatin daily . Hypertension   takes Amlodipine and Ramipril daily . Joint swelling  . Nocturia  . Peripheral arterial disease (HCC)  . Pulmonary embolism (HCC)   Refer to VQ scan.  Past Surgical History: Past Surgical History: Procedure Laterality Date . BACK SURGERY   . colonosocpy   . ENDARTERECTOMY FEMORAL Right 02/25/2015  Procedure: ENDARTERECTOMYRight  FEMORAL Endarterectomy with profundaplasty.;  Surgeon: Sherren Kerns, MD;  Location: [facility] OR;  Service: Vascular;  Laterality: Right; . FEMORAL-POPLITEAL BYPASS GRAFT Right 02/25/2015  Procedure: BYPASS GRAFT FEMORAL-POPLITEAL ARTERY using non-reversed saphenous vein.;  Surgeon: Sherren Kerns, MD;  Location: [facility] OR;  Service: Vascular;  Laterality: Right; .  PERIPHERAL VASCULAR CATHETERIZATION N/A 01/24/2015  Procedure: Abdominal Aortogram;  Surgeon: Sherren Kerns, MD;  Location: [facility] INVASIVE CV LAB;  Service: Cardiovascular;  Laterality: N/A; HPI: 79 yo male adm to [facility] with FTT, pneunomediastinum, cavitary lung lesions, bilateral PE, now on heparin per MD notes.  Per notes, pt's daughter, wife and dog passed and concern for depression present.  Pt denies dysphagia.  Swallow evaluation ordered.  Subjective: pt awake in chair Assessment / Plan / Recommendation CHL IP CLINICAL IMPRESSIONS 08/07/2019 Clinical Impression Patient presents with sensormotor based oropharyngeal dysphagia.  Weak muscle contraction and decreased oral control results in poor oral bolus cohesion and pharyngeal retention/aspiration.  Lingual pumping observed across consistencies with delay - SLP counting 1, 2, 3 - did not appear to improve efficency.  Pt aspirated thin liquids due to decreased timing and adequacy of laryngeal closure.  Reflexive cough noted with aspiration but did not clear aspirates.  Chin tuck posture prevented aspiration but allowed laryngeal penetration of thin.  Pharyngeal retention noted across all consistencies due to weakness without adequate sensation/awareness.  Pt does intermittent propel vallecular residuals into oral cavity using tongue base and expectorate= admitting he has done this prior to admissoin.  SLP recommends to continue current diet with strict precautions and allow single tsps of thin with chin tuck (with staff only).  Pt's swallowing appears laborious for him and concern for nutritional adequacy present.  Using teach back, educated pt to findings/recommendations.  Thankful palliative is following.  Marland Kitchen SLP Visit Diagnosis Dysphagia, oropharyngeal phase (R13.12) Attention and concentration deficit following -- Frontal lobe and executive function deficit following -- Impact on safety and function Risk for inadequate nutrition/hydration;Moderate aspiration  risk   CHL IP TREATMENT RECOMMENDATION 08/07/2019 Treatment Recommendations F/U MBS in --- days (Comment);Therapy as outlined in treatment plan below;Defer until completion of intrumental exam   Prognosis 08/07/2019 Prognosis for Safe Diet Advancement Guarded Barriers to Reach Goals Time post onset;Motivation Barriers/Prognosis Comment -- CHL IP DIET RECOMMENDATION 08/07/2019 SLP Diet Recommendations Dysphagia 3 (Mech soft) solids;Nectar thick liquid Liquid Administration via Cup;Straw Medication Administration Crushed with puree Compensations Slow rate;Small sips/bites Postural Changes Seated upright at 90 degrees;Remain semi-upright after after feeds/meals (Comment)   CHL IP OTHER RECOMMENDATIONS 08/07/2019 Recommended Consults -- Oral Care Recommendations Oral care QID Other Recommendations Have oral suction available   No flowsheet data found.  CHL IP FREQUENCY  AND DURATION 08/07/2019 Speech Therapy Frequency (ACUTE ONLY) min 2x/week Treatment Duration 2 weeks      CHL IP ORAL PHASE 08/07/2019 Oral Phase Impaired Oral - Pudding Teaspoon -- Oral - Pudding Cup -- Oral - Honey Teaspoon -- Oral - Honey Cup -- Oral - Nectar Teaspoon Decreased bolus cohesion;Weak lingual manipulation;Delayed oral transit;Reduced posterior propulsion Oral - Nectar Cup Decreased bolus cohesion;Weak lingual manipulation;Delayed oral transit;Reduced posterior propulsion Oral - Nectar Straw Decreased bolus cohesion;Weak lingual manipulation;Delayed oral transit;Reduced posterior propulsion Oral - Thin Teaspoon Decreased bolus cohesion;Weak lingual manipulation;Reduced posterior propulsion;Premature spillage Oral - Thin Cup Decreased bolus cohesion;Weak lingual manipulation;Delayed oral transit;Reduced posterior propulsion;Premature spillage Oral - Thin Straw Decreased bolus cohesion;Weak lingual manipulation;Delayed oral transit;Reduced posterior propulsion Oral - Puree Decreased bolus cohesion;Weak lingual manipulation;Delayed oral  transit;Reduced posterior propulsion Oral - Mech Soft Decreased bolus cohesion;Weak lingual manipulation;Delayed oral transit;Reduced posterior propulsion Oral - Regular -- Oral - Multi-Consistency -- Oral - Pill -- Oral Phase - Comment --  CHL IP PHARYNGEAL PHASE 08/07/2019 Pharyngeal Phase Impaired Pharyngeal- Pudding Teaspoon -- Pharyngeal -- Pharyngeal- Pudding Cup -- Pharyngeal -- Pharyngeal- Honey Teaspoon -- Pharyngeal -- Pharyngeal- Honey Cup -- Pharyngeal -- Pharyngeal- Nectar Teaspoon Reduced epiglottic inversion;Reduced pharyngeal peristalsis;Reduced laryngeal elevation;Pharyngeal residue - pyriform;Pharyngeal residue - valleculae Pharyngeal Material does not enter airway Pharyngeal- Nectar Cup Pharyngeal residue - valleculae;Pharyngeal residue - pyriform;Reduced pharyngeal peristalsis;Reduced epiglottic inversion;Reduced anterior laryngeal mobility;Reduced laryngeal elevation;Reduced airway/laryngeal closure;Reduced tongue base retraction Pharyngeal Material does not enter airway Pharyngeal- Nectar Straw Pharyngeal residue - valleculae;Pharyngeal residue - pyriform;Reduced pharyngeal peristalsis;Reduced epiglottic inversion;Reduced anterior laryngeal mobility;Reduced laryngeal elevation;Reduced airway/laryngeal closure Pharyngeal Material does not enter airway Pharyngeal- Thin Teaspoon Penetration/Aspiration before swallow;Trace aspiration;Pharyngeal residue - valleculae;Pharyngeal residue - pyriform Pharyngeal Material enters airway, passes BELOW cords and not ejected out despite cough attempt by patient Pharyngeal- Thin Cup Delayed swallow initiation-pyriform sinuses;Penetration/Aspiration during swallow;Trace aspiration;Pharyngeal residue - valleculae;Pharyngeal residue - pyriform Pharyngeal Material enters airway, passes BELOW cords and not ejected out despite cough attempt by patient Pharyngeal- Thin Straw Penetration/Aspiration during swallow;Pharyngeal residue - valleculae;Pharyngeal residue -  pyriform;Reduced pharyngeal peristalsis;Reduced epiglottic inversion;Reduced anterior laryngeal mobility;Reduced laryngeal elevation;Reduced airway/laryngeal closure;Reduced tongue base retraction Pharyngeal Material enters airway, remains ABOVE vocal cords and not ejected out Pharyngeal- Puree Reduced pharyngeal peristalsis;Reduced tongue base retraction;Reduced laryngeal elevation;Reduced anterior laryngeal mobility;Reduced airway/laryngeal closure;Pharyngeal residue - valleculae Pharyngeal Material does not enter airway Pharyngeal- Mechanical Soft Reduced laryngeal elevation;Reduced anterior laryngeal mobility;Reduced epiglottic inversion;Reduced pharyngeal peristalsis;Reduced airway/laryngeal closure;Reduced tongue base retraction;Pharyngeal residue - valleculae Pharyngeal Material does not enter airway Pharyngeal- Regular -- Pharyngeal -- Pharyngeal- Multi-consistency -- Pharyngeal -- Pharyngeal- Pill -- Pharyngeal -- Pharyngeal Comment chin tuck prevent aspiration but allowed laryngeal penetration of thin and pt needed total cues to perform; head turn left (pt leaning toward left) did not decrease  pharyngeal retention; pt tilts head posterior to aid oral transiting of boluses; pt propels boluses from pharynx into oral cavity and expectorates or re=swallows- stating he has done this prior to admission; pt with reflexive cough with aspiration events but cough did not clear aspirates  CHL IP CERVICAL ESOPHAGEAL PHASE 08/07/2019 Cervical Esophageal Phase Impaired Pudding Teaspoon -- Pudding Cup -- Honey Teaspoon -- Honey Cup -- Nectar Teaspoon -- Nectar Cup -- Nectar Straw -- Thin Teaspoon -- Thin Cup -- Thin Straw -- Puree -- Mechanical Soft -- Regular -- Multi-consistency -- Pill -- Cervical Esophageal Comment Prominent cricopharyngeus apparent x1 with nectar liquid did not impair barium flow; At end of testing,pt's esophagus appeared with retention distally without pt sensation  Rolena Infante, MS [facility] SLP Acute Rehab  Services Office [phone] Chales Abrahams 08/07/2019, 10:00 AM              ECHOCARDIOGRAM COMPLETE  Result Date: 08/07/2019    ECHOCARDIOGRAM REPORT   Patient Name:   Kevin Reilly Date of Exam: 08/06/2019 Medical Rec #:  [id number]     Height:       68.0 in Accession #:    [id number]    Weight:       115.5 lb Date of Birth:  1941/02/07     BSA:          1.619 m Patient Age:    79 years      BP:           116/67 mmHg Patient Gender: M             HR:           81 bpm. Exam Location:  Inpatient Procedure: 2D Echo Indications:    Pulmonary Embolus 415.19 / I26.99  History:        Patient has prior history of Echocardiogram examinations, most                 recent 06/08/2019. CHF, CAD, PAD, Pulmonic Valve Disease; Risk                 Factors:Hypertension and Dyslipidemia. DVT, Bilateral carotid                 artery disease, Cardiomyopathy.  Sonographer:    Leta Jungling RDCS Referring Phys: [id number] VASUNDHRA RATHORE IMPRESSIONS  1. Left ventricular ejection fraction, by estimation, is 25 to 30%. The left ventricle has severely decreased function. The left ventricle demonstrates global hypokinesis. Left ventricular diastolic parameters are consistent with Grade I diastolic dysfunction (impaired relaxation).  2. Right ventricular systolic function is normal. The right ventricular size is normal.  3. Left atrial size was mildly dilated.  4. Right atrial size was mildly dilated.  5. The mitral valve is normal in structure. Trivial mitral valve regurgitation.  6. The aortic valve is tricuspid. Aortic valve regurgitation is trivial. No aortic stenosis is present. Mild aortic valve annular calcification. There is mild thickening of the aortic valve. There is moderate calcification of the aortic valve Comparison(s): No significant change from prior study. 06/08/2019. FINDINGS  Left Ventricle: Left ventricular ejection fraction, by estimation, is 25 to 30%. The left ventricle has severely decreased function. The  left ventricle demonstrates global hypokinesis. The left ventricular internal cavity size was normal in size. There is no left ventricular hypertrophy. Left ventricular diastolic parameters are consistent with Grade I diastolic dysfunction (impaired relaxation). Normal left ventricular filling pressure. Right Ventricle: The right ventricular size is normal. No increase in right ventricular wall thickness. Right ventricular systolic function is normal. Left Atrium: Left atrial size was mildly dilated. Right Atrium: Right atrial size was mildly dilated. Pericardium: There is no evidence of pericardial effusion. Mitral Valve: The mitral valve is normal in structure. Trivial mitral valve regurgitation. Tricuspid Valve: The tricuspid valve is normal in structure. Tricuspid valve regurgitation is not demonstrated. No evidence of tricuspid stenosis. Aortic Valve: The aortic valve is tricuspid. . There is mild thickening and moderate calcification of the aortic valve. Aortic valve regurgitation is trivial. No aortic stenosis is present. Mild aortic valve annular calcification. There is mild thickening of the aortic valve. There is moderate calcification of the aortic valve. Pulmonic Valve: The pulmonic valve was  grossly normal. Pulmonic valve regurgitation is trivial. Aorta: The aortic root is normal in size and structure. Venous: The inferior vena cava was not well visualized. IAS/Shunts: No atrial level shunt detected by color flow Doppler.  LEFT VENTRICLE PLAX 2D LVIDd:         5.50 cm      Diastology LVIDs:         4.50 cm      LV e' lateral:   4.22 cm/s LV PW:         0.80 cm      LV E/e' lateral: 12.2 LV IVS:        0.90 cm      LV e' medial:    5.27 cm/s LVOT diam:     2.00 cm      LV E/e' medial:  9.8 LV SV:         57 LV SV Index:   35 LVOT Area:     3.14 cm  LV Volumes (MOD) LV vol d, MOD A2C: 210.0 ml LV vol d, MOD A4C: 160.0 ml LV vol s, MOD A2C: 136.0 ml LV vol s, MOD A4C: 124.0 ml LV SV MOD A2C:     74.0 ml  LV SV MOD A4C:     160.0 ml LV SV MOD BP:      54.0 ml RIGHT VENTRICLE RV S prime:     12.30 cm/s TAPSE (M-mode): 2.2 cm LEFT ATRIUM             Index       RIGHT ATRIUM           Index LA diam:        3.60 cm 2.22 cm/m  RA Area:     14.80 cm LA Vol (A2C):   48.6 ml 30.03 ml/m RA Volume:   39.80 ml  24.59 ml/m LA Vol (A4C):   38.1 ml 23.54 ml/m LA Biplane Vol: 44.2 ml 27.31 ml/m  AORTIC VALVE LVOT Vmax:   108.00 cm/s LVOT Vmean:  66.000 cm/s LVOT VTI:    0.180 m  AORTA Ao Root diam: 3.30 cm MITRAL VALVE MV Area (PHT): 1.72 cm     SHUNTS MV Decel Time: 440 msec     Systemic VTI:  0.18 m MV E velocity: 51.40 cm/s   Systemic Diam: 2.00 cm MV A velocity: 113.00 cm/s MV E/A ratio:  0.45 Yates Decamp MD Electronically signed by Yates Decamp MD Signature Date/Time: 08/07/2019/3:00:09 PM    Final     Microbiology: Recent Results (from the past 240 hour(s))  Blood Culture (routine x 2)     Status: None   Collection Time: 08/03/19 10:24 PM   Specimen: BLOOD  Result Value Ref Range Status   Specimen Description BLOOD RIGHT FOREARM  Final   Special Requests   Final    BOTTLES DRAWN AEROBIC AND ANAEROBIC Blood Culture results may not be optimal due to an inadequate volume of blood received in culture bottles   Culture   Final    NO GROWTH 5 DAYS Performed at [facility], [address] [place], [place] [address]    Report Status 08/08/2019 FINAL  Final  Blood Culture (routine x 2)     Status: None   Collection Time: 08/03/19 10:26 PM   Specimen: BLOOD  Result Value Ref Range Status   Specimen Description BLOOD LEFT ARM  Final   Special Requests   Final    BOTTLES DRAWN AEROBIC ONLY  Blood Culture results may not be optimal due to an inadequate volume of blood received in culture bottles   Culture   Final    NO GROWTH 5 DAYS Performed at [facility] [address], [place], [place] [address]    Report Status 08/08/2019 FINAL  Final  Respiratory Panel by RT PCR (Flu A&B, Covid) -  Nasopharyngeal Swab     Status: None   Collection Time: 08/03/19 11:50 PM   Specimen: Nasopharyngeal Swab  Result Value Ref Range Status   SARS Coronavirus 2 by RT PCR NEGATIVE NEGATIVE Final    Comment: (NOTE) SARS-CoV-2 target nucleic acids are NOT DETECTED. The SARS-CoV-2 RNA is generally detectable in upper respiratoy specimens during the acute phase of infection. The lowest concentration of SARS-CoV-2 viral copies this assay can detect is 131 copies/mL. A negative result does not preclude SARS-Cov-2 infection and should not be used as the sole basis for treatment or other patient management decisions. A negative result may occur with  improper specimen collection/handling, submission of specimen other than nasopharyngeal swab, presence of viral mutation(s) within the areas targeted by this assay, and inadequate number of viral copies (<131 copies/mL). A negative result must be combined with clinical observations, patient history, and epidemiological information. The expected result is Negative. Fact Sheet for Patients:  PinkCheek.be Fact Sheet for Healthcare Providers:  GravelBags.it This test is not yet ap proved or cleared by the [place] FDA and  has been authorized for detection and/or diagnosis of SARS-CoV-2 by FDA under an Emergency Use Authorization (EUA). This EUA will remain  in effect (meaning this test can be used) for the duration of the COVID-19 declaration under Section 564(b)(1) of the Act, 21 U.S.C. section 360bbb-3(b)(1), unless the authorization is terminated or revoked sooner.    Influenza A by PCR NEGATIVE NEGATIVE Final   Influenza B by PCR NEGATIVE NEGATIVE Final    Comment: (NOTE) The Xpert Xpress SARS-CoV-2/FLU/RSV assay is intended as an aid in  the diagnosis of influenza from Nasopharyngeal swab specimens and  should not be used as a sole basis for treatment. Nasal washings and  aspirates  are unacceptable for Xpert Xpress SARS-CoV-2/FLU/RSV  testing. Fact Sheet for Patients: PinkCheek.be Fact Sheet for Healthcare Providers: GravelBags.it This test is not yet approved or cleared by the [place] FDA and  has been authorized for detection and/or diagnosis of SARS-CoV-2 by  FDA under an Emergency Use Authorization (EUA). This EUA will remain  in effect (meaning this test can be used) for the duration of the  Covid-19 declaration under Section 564(b)(1) of the Act, 21  U.S.C. section 360bbb-3(b)(1), unless the authorization is  terminated or revoked. Performed at [facility], [place] [address], [place], [place] [address]   Urine culture     Status: None   Collection Time: 08/04/19 11:18 AM   Specimen: In/Out Cath Urine  Result Value Ref Range Status   Specimen Description IN/OUT CATH URINE  Final   Special Requests NONE  Final   Culture   Final    NO GROWTH Performed at [facility], [place] [address] [place],  [address]    Report Status 08/05/2019 FINAL  Final  MRSA PCR Screening     Status: None   Collection Time: 08/04/19  3:21 PM   Specimen: Nasal Mucosa; Nasopharyngeal  Result Value Ref Range Status   MRSA by PCR NEGATIVE NEGATIVE Final    Comment:  The GeneXpert MRSA Assay (FDA approved for NASAL specimens only), is one component of a comprehensive MRSA colonization surveillance program. It is not intended to diagnose MRSA infection nor to guide or monitor treatment for MRSA infections. Performed at [facility], [address], [place], [place] [address]      Labs: Basic Metabolic Panel: Recent Labs  Lab 08/03/19 2223 08/03/19 2223 08/03/19 2314 08/04/19 0322 08/05/19 0245 08/06/19 0035 08/07/19 0218 08/08/19 0815  NA 145   < > 141  --  140 141 141 142  K 2.9*   < > 4.2  --  3.4* 3.7 3.3* 3.8  CL 124*  --   --   --  101 102 102 102  CO2 13*  --   --    --  28 31 30 29   GLUCOSE 85  --   --   --  84 127* 94 100*  BUN 65*  --   --   --  78* 59* 31* 18  CREATININE 1.37*  --   --   --  1.88* 1.78* 1.39* 1.48*  CALCIUM 5.7*  --   --   --  8.8* 8.3* 8.7* 9.0  MG  --   --   --  2.3  --  1.8  --   --   PHOS  --   --   --   --   --  2.8  --   --    < > = values in this interval not displayed.   Liver Function Tests: Recent Labs  Lab 08/03/19 2223  AST 20  ALT 19  ALKPHOS 63  BILITOT 0.5  PROT 4.5*  ALBUMIN 1.1*   No results for input(s): LIPASE, AMYLASE in the last 168 hours. Recent Labs  Lab 08/03/19 2224  AMMONIA 37*   CBC: Recent Labs  Lab 08/03/19 2223 08/03/19 2314 08/05/19 0245 08/06/19 0035 08/07/19 0218  WBC 14.0*  --  11.1* 9.3 11.5*  NEUTROABS 12.8*  --   --   --   --   HGB 8.5* 11.9* 9.3* 8.7* 9.5*  HCT 29.2* 35.0* 30.9* 28.7* 31.2*  MCV 96.7  --  91.7 92.6 92.0  PLT 429*  --  386 354 332   Cardiac Enzymes: Recent Labs  Lab 08/03/19 2223  CKTOTAL 84   BNP: BNP (last 3 results) Recent Labs    05/27/19 1302 07/09/19 1515 08/03/19 2321  BNP 2,940.0* 986.8* 94.1    ProBNP (last 3 results) Recent Labs    06/05/19 1530 06/08/19 1439 06/21/19 1426  PROBNP 12,549* 16,303* 8,187*    CBG: No results for input(s): GLUCAP in the last 168 hours.     Signed:  Darlin Drop, MD [facility] 08/08/2019, 3:36 PM

## 2019-08-08 NOTE — Progress Notes (Signed)
[facility]) Hospital Liaison: RN note   Notified by [facility] manager of patient/family request for [facility] Palliative services at [facility] after discharge.   [facility] Palliative team will follow up with patient after discharge at [facility].   Please call with any hospice or palliative related questions.   Thank you for this referral.   Elsie Saas, RN, CCM  [facility] Liaison (listed on AMION under Hospice[facility])  [phone]

## 2019-08-08 NOTE — Progress Notes (Signed)
IV removed without complication, new foley inserted due to urinary retention 495 mL on bladder scan. Report given to RN at [facility]. Awaiting PTAR for transportation.

## 2019-08-08 NOTE — TOC Progression Note (Signed)
Transition of Care [facility]) - Progression Note    Patient Details  Name: Kevin Reilly MRN: [id number] Date of Birth: 04/21/1940  Transition of Care [name], [place] Phone Number: 08/08/2019, 2:13 PM  Clinical Narrative:     CSW received call and texts from patient's RN with [facility] that reports she has been with family since February ([name]) , states [name] (caregiver) was withholding patient's declining medical status from family, and family had called [facility] concerned and that his how patient ended up in the hospital. Reports Kevin Reilly, his daughter and his wife very positively involved in patient's care.   CSW met with Kevin Reilly and his wife Kevin Reilly at bedside, [name] also spoke with patient to inform of him going to skilled nursing short term rehab, and explained that next of kin Kevin Reilly will be making decision as [name] nor [name] wish to be involved in health care decisions only financial. Patient non verbal throughout but very calm while CSW and family in room. CSW informed of bed offer from [name] pending medical readiness, family in agreement at this time.    Expected Discharge Plan: [place] Barriers to Discharge: Continued Medical Work up  Expected Discharge Plan and Services Expected Discharge Plan: [place] Choice: [place] arrangements for the past 2 months: [place] Expected Discharge Date: 08/08/19                                     Social Determinants of Health (SDOH) Interventions    Readmission Risk Interventions No flowsheet data found.

## 2019-08-08 NOTE — Progress Notes (Signed)
Patients belongings from security given to PTAR still sealed in plastic and sent with patient to [facility].

## 2019-08-08 NOTE — Progress Notes (Signed)
Daily Progress Note   Patient Name: Kevin Reilly       Date: 08/08/19 DOB: 1940/11/04  Age: 79 y.o. MRN#: [id number] Attending Physician: Kevin Memos, DO Primary Care Physician: Patient, No Pcp Per Admit Date: 08/03/2019   Reason for Consultation/Follow-up: Establishing goals of care  Subjective: Patient awake, alert, oriented to person/place and easily reoriented to situation. He is accepting bites and sips from friend/caregiver, Kevin Reilly at bedside. Kevin Reilly is short with responses but able to participate in discussion this morning.   GOC: Friend/caregiver Kevin Reilly) at bedside gives input on Kevin Reilly prior to admission. Kevin Reilly reiterates that Avelardo's wife and daughter have passed and that since that time, Kevin Reilly checks in on [place] frequently in his home, assisting with meals and bathing. 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   Patient told this NP and RN x2 yesterday that he does not want brother, Wynetta Emery to make medical decisions. Patient wishes for caregiver Kevin Reilly to make medical decisions for him. Explained this to Kevin Reilly at bedside. Kevin Reilly shares that patient has durable/financial POA (friend Kevin Reilly) but does not have HCPOA. Asked Tori who he wishes to make medical decisions for him if he did not have capacity to do so. Patient points at [place]. Kevin Reilly shares that she is willing to be [place]'s POA but does not want to make the wrong decisions for Yasir and would prefer to have joint HCPOA with friend, Kevin Reilly. Explained importance of discussing his wishes now while he is awake, alert and able to share his wishes with friends/family and care team. Kevin Reilly shares that lawyer, Kevin Reilly is working on paperwork but sounds like she is only working on  durable POA. Explained importance to patient and Kevin Reilly to have HCPOA paperwork completed too, or legally care team would need to discuss care with next-of-kin who is Wynetta Emery and other family members.   MOST form was previously discussed with PMT provider, Kevin Reilly. Patient does remember seeing pink MOST form from [place]. Patient is able to appropriately tell this NP and caregiver, Kevin Reilly at bedside his wishes against resuscitation, life support/ventilator, or feeding tube placement. Discussed and completed paper and electronic MOST form with patient and Kevin Reilly. Patient's wishes include: DNR/DNI, limited additional interventions including CPAP/BiPAP if indicated, IVF/ABX if indicated,  and NO feeding. It was easier for patient to sign electronic MOST form which is now in his medical record. Durable DNR completed. Although patient does exhibit some confusion with situation and care plan, this NP does feel he appropriately speaks of his wishes for DNR/DNI code status and this should be documented in order to prevent further escalation of care that the patient does not wish for. Copy of MOST form given to [place] and placed in paper chart.   Patient understands he is very weak and will need to discharge to SNF for rehab. He is agreeable to this plan. Grace left bedside by end of conversation.    **Received call from Kevin Reilly, SW who has been in discussion with attending Dr Margo Aye and patient's lawyer Kevin Reilly. Until HCPOA paperwork is completed, decision making will be deferred to next of kin (brother, Kevin Reilly). Kevin Reilly in contact with brother Kevin Reilly about discharge plan to SNF rehab.     Length of Stay: 4  Current Medications: Scheduled Meds:  . apixaban  10 mg Oral BID   Followed by  . [START ON 08/13/2019] apixaban  5 mg Oral BID  . atorvastatin  20 mg Oral Daily  . feeding supplement (ENSURE ENLIVE)  237 mL Oral TID BM  . ferrous sulfate  325 mg Oral Q breakfast  . folic acid  1 mg Oral Daily     Continuous Infusions: . piperacillin-tazobactam (ZOSYN)  IV 3.375 g (08/08/19 0903)    PRN Meds: acetaminophen **OR** acetaminophen, Resource ThickenUp Clear  Physical Exam Vitals and nursing note reviewed.  Constitutional:      General: He is awake.     Appearance: He is cachectic. He is ill-appearing.  HENT:     Head: Normocephalic and atraumatic.  Cardiovascular:     Rate and Rhythm: Normal rate.  Pulmonary:     Effort: No tachypnea, accessory muscle usage or respiratory distress.     Comments: Room air Skin:    General: Skin is warm and dry.  Neurological:     Mental Status: He is alert.     Comments: Oriented to person/place. Reoriented on situation.   Psychiatric:     Comments: Irritable, withdrawn            Vital Signs: BP (!) 145/75 (BP Location: Right Arm)   Reilly 87   Temp (!) 97.3 F (36.3 C) (Oral)   Resp 17   Ht 5\' 8"  (1.727 m)   Wt 52.4 kg   SpO2 96%   BMI 17.56 kg/m  SpO2: SpO2: 96 % O2 Device: O2 Device: Room Air O2 Flow Rate: O2 Flow Rate (L/min): 3 L/min  Intake/output summary:   Intake/Output Summary (Last 24 hours) at 08/08/2019 1038 Last data filed at 08/08/2019 0600 Gross per 24 hour  Intake 50 ml  Output 850 ml  Net -800 ml   LBM: Last BM Date: 08/05/19 Baseline Weight: Weight: 52.4 kg Most recent weight: Weight: 52.4 kg       Palliative Assessment/Data: PPS 40%      Patient Active Problem List   Diagnosis Date Noted  . Pneumomediastinum (HCC) 08/04/2019  . Pulmonary embolism (HCC) 08/04/2019  . Cavitary lesion of lung 08/04/2019  . Acute respiratory failure with hypoxemia (HCC) 08/04/2019  . Acute metabolic encephalopathy 08/04/2019  . Palliative care by specialist   . Goals of care, counseling/discussion   . DNR (do not resuscitate)   . Dilated cardiomyopathy (HCC) 06/11/2019  . Chronic combined systolic and diastolic heart failure (  HCC) 06/11/2019  . Mixed hyperlipidemia 05/30/2019  . Dyspnea on exertion  05/30/2019  . Acute combined systolic and diastolic heart failure (HCC) 05/19/2019  . PAD (peripheral artery disease) (HCC) 02/25/2015  . Bilateral carotid artery disease (HCC) 02/05/2015  . Essential hypertension 01/01/2014  . Critical lower limb ischemia 01/01/2014  . Bilateral lower extremity edema 01/01/2014    Palliative Care Assessment & Plan   Patient Profile: Per intake H&P --> Bucky Grigg Havisis a 79 y.o.malewith medical history significant ofbilateral carotid artery disease, CHF, PAD, DVT, hypertension, hyperlipidemia, history of PE on Eliquis presenting via EMS for evaluation of altered mental status. It is reported that the patient lives alone and has a caregiver who checks on him. Caregiver found him lying on the floor and acting abnormally. Patient was slow to respond. Upon EMS arrival, heart rate 120 and blood pressure 104/70.  Palliative care was asked to consult on Dimitrious given his severe malnutrition and poor overall clinical status.  Assessment: Large pneumomediastinum Bilateral pulmonary emboli Cavitary lung lesions Dysphagia AKI Acute metabolic encephalopathy, improving  Recommendations/Plan:  Although patient does have intermittent confusion and lacks some understanding of complexity of current health conditions, this NP does feel like he is very clear for his wishes for DNR/DNI code status and this should be documented in order to prevent escalation of care that patient does not wish for. MOST form completed with patient and friend/caregiver [name] at bedside. Patient's wishes include: DNR/DNI, limited additional intervention including re-hospitalization, treat the treatable, IVF/ABX if indicated, and NO feeding tube. Durable DNR completed. Electronic [facility] MOST completed.   Patient was adamant with RN and this NP that he would wish for caregiver, [name] to be HCPOA. Lawyer is involved and assisting with paperwork. Until paperwork is completed, legally, patient's  brother Kevin Reilly) is Museum/gallery exhibitions officer. Challenging family dynamics.   Continue current plan of care and medical management.   Pending SNF rehab. Likely will discharge today.   Outpatient palliative referral for ongoing goals of care discussions.  Code Status: DNR/DNI   Code Status Orders  (From admission, onward)         Start     Ordered   08/04/19 1625  Do not attempt resuscitation (DNR)  Continuous    Question Answer Comment  In the event of cardiac or respiratory ARREST Do not call a "code blue"   In the event of cardiac or respiratory ARREST Do not perform Intubation, CPR, defibrillation or ACLS   In the event of cardiac or respiratory ARREST Use medication by any route, position, wound care, and other measures to relive pain and suffering. May use oxygen, suction and manual treatment of airway obstruction as needed for comfort.      08/04/19 1624        Code Status History    Date Active Date Inactive Code Status Order ID Comments User Context   08/04/2019 0727 08/04/2019 1624 Full Code [id number]  John Giovanni, MD ED   02/25/2015 1758 02/27/2015 1357 Full Code [id number]  Garth Schlatter Inpatient   01/24/2015 1154 01/24/2015 1918 Full Code [id number]  Sherren Kerns, MD Inpatient   Advance Care Planning Activity       Prognosis:   Unable to determine: guarded   Discharge Planning:  Skilled Nursing Facility for rehab with Palliative care service follow-up  Care plan was discussed with RN, SW, patient, friend/caregiver [name] [place]), Dr. Margo Aye   Thank you for allowing the Palliative Medicine Team to assist in the care  of this patient.   Time In: 0940 Time Out: 1040 Total Time 60 Prolonged Time Billed no    Greater than 50% of this time was spent counseling and coordinating care related to the above assessment and plan.   Vennie Homans, DNP, FNP-C Palliative Medicine Team  Phone: [phone] Fax: [phone]  Please contact Palliative  Medicine Team phone at [phone] for questions and concerns.

## 2019-08-09 ENCOUNTER — Non-Acute Institutional Stay (SKILLED_NURSING_FACILITY): Payer: Medicare Other | Admitting: Internal Medicine

## 2019-08-09 ENCOUNTER — Encounter: Payer: Self-pay | Admitting: Internal Medicine

## 2019-08-09 DIAGNOSIS — J9601 Acute respiratory failure with hypoxia: Secondary | ICD-10-CM | POA: Diagnosis not present

## 2019-08-09 DIAGNOSIS — R29818 Other symptoms and signs involving the nervous system: Secondary | ICD-10-CM | POA: Insufficient documentation

## 2019-08-09 DIAGNOSIS — G9341 Metabolic encephalopathy: Secondary | ICD-10-CM

## 2019-08-09 DIAGNOSIS — R338 Other retention of urine: Secondary | ICD-10-CM | POA: Insufficient documentation

## 2019-08-09 DIAGNOSIS — R4189 Other symptoms and signs involving cognitive functions and awareness: Secondary | ICD-10-CM

## 2019-08-09 DIAGNOSIS — Z66 Do not resuscitate: Secondary | ICD-10-CM

## 2019-08-09 DIAGNOSIS — I5041 Acute combined systolic (congestive) and diastolic (congestive) heart failure: Secondary | ICD-10-CM | POA: Diagnosis not present

## 2019-08-09 NOTE — Patient Instructions (Signed)
See assessment and plan under each diagnosis in the problem list and acutely for this visit 

## 2019-08-09 NOTE — Assessment & Plan Note (Signed)
Patient lacks competency to handle his affairs or elect DNR.  Legal guardianship will need to be pursued.  Psych follow-up at SNF will be requested.

## 2019-08-09 NOTE — Assessment & Plan Note (Signed)
Outpatient follow-up with Dr. Jacinto Halim to be scheduled

## 2019-08-09 NOTE — Assessment & Plan Note (Signed)
CT chest scan 6 weeks post discharge

## 2019-08-09 NOTE — Assessment & Plan Note (Addendum)
Psych consultation to confirm lack of competency and address depression.  At this time mental status testing suggest he does not have a competency to make medical decisions such as CODE STATUS.DNR is certainly appropriate in view of his dementia, profound cachexia and multiple advanced comorbidities. Clinically he is terminal

## 2019-08-09 NOTE — Assessment & Plan Note (Signed)
Mental status testing will be completed at the SNF to determine competency as he has no healthcare power of attorney.

## 2019-08-09 NOTE — Assessment & Plan Note (Addendum)
F/U OP visit 5/26 at 9:30 AM for voiding study

## 2019-08-09 NOTE — Progress Notes (Signed)
NURSING HOME LOCATION:  [facility] ROOM NUMBER:  [id number]  CODE STATUS:  DNR  PCP:  Patient, No Pcp Per    This is a comprehensive admission note to [facility] performed on this date less than 30 days from date of admission. Included are preadmission medical/surgical history; reconciled medication list; family history; social history and comprehensive review of systems.  Corrections and additions to the records were documented. Comprehensive physical exam was also performed. Additionally a clinical summary was entered for each active diagnosis pertinent to this admission in the Problem List to enhance continuity of care.  HPI: Patient was hospitalized 4/23-4/28/2021 presenting with altered mental status.  His caregiver found him lying on the floor and confused after an apparent unwitnessed fall.  He may have been there several days.  CT of the head revealed no acute findings.  CT of the chest with contrast revealed bilateral pulmonary emboli despite having an active prescription for Eliquis for prior history of DVT. Possible prescription noncompliance was suspected.  Because of the possibility of Eliquis failure heparin drip was initiated and continued until 4/26. Also documented were pneumomediastinum and suspected cavitary pneumonia versus possible cavitary neoplasm.  CT documented a 9.2 cm x 4.4 cm x 4.2 cm lobulated, thick-walled left upper lobe cavitary lesion.  Additional smaller right middle lobe cavitary lesion with a 9 mm noncalcified right lower lobe nodule was present.  Pulmonary critical care medicine felt the cavitary lesions were possibly related to prior PE with infarction versus necrotizing infection and less likely active tuberculosis.  He completed 5 days of Zosyn.  Procalcitonin decreased from 0.49 down to a value of 0.1.  Blood cultures were all negative.  No sputum could culture could be collected as he exhibited no productive cough. The large pneumomediastinum  was suspected as trauma related.  No pneumothorax was present. The acute metabolic encephalopathy was felt to be multifactorial secondary to severe dehydration and acute infectious process. He exhibited a chronic normocytic anemia with hemoglobin ranging from 8.7-9.5.  Lab results suggested iron and folate deficiency. Echo 4/26 revealed LVEF 25-30% with grade 1 diastolic dysfunction.   Diuretics were held due to AKI.  Baseline creatinine was felt to be 0.8; at admission it was 1.88 and 1.4 at discharge.  Foley was inserted 4/24 and discontinued on 4/28.  Urinary retention required replacement.  Follow-up with urology, Dr. Matilde Sprang for voiding trial was scheduled for 5/26 at 9:30 AM. Post discharge follow-up with Dr. Einar Gip was to be scheduled. Follow-up chest CT scan in 6 weeks post discharge was to be scheduled. Speech therapy recommended dysphagia 3/mechanical soft solids/nectar thick diet. Underlying depression was of concern but difficult to define definitely due to his encephalopathy.  This was in the context of him losing his wife, daughter, and dog.  Psych recommended sertraline 25 mg daily.  Past medical and surgical history: Includes PAD, essential hypertension, dyslipidemia, history of DVT and PTE, cardiomyopathy with CHF, and carotid artery disease. He has had a right endarterectomy of the femoral artery as well as from-pop bypass grafting.  Social history: He is a nondrinker.  He is a former smoker  Family history: He lives alone.   Review of systems:  Could not be completed due to dementia.  Mental status testing revealed a score of 7 out of 30 which represents severe dementia.  He told the examiner that he had graduated from [employer] which to my knowledge does not exist.  He told me he graduated from [employer],  apparently in [place]. He denied any active symptoms and was essentially nonverbal simply shaking his head from side to side slowly.  When he did verbalize his voice  was low and gravelly, almost a whisper.  Physical exam:  Pertinent or positive findings: He is profoundly cachectic with diffuse muscular wasting in the temples, thorax, abdomen, and extremities.  He is Transport planner.  Ptosis is present on the right.  For the most part he kept his eyes closed.  He is edentulous except for 3 lower anterior maxillary teeth. Heart sounds are distant and difficult to discern.  Rhythm clinically is irregular.  Breath sounds are markedly decreased.  Abdomen is scaphoid.  Intermittent aortic bruit is present.  Aorta is palpable and clinically there is no enlargement.  Posterior tibial pulses are stronger than the dorsalis pedis pulses.  Despite the limb atrophy, strength to opposition was surprisingly good.  There is dense hyperpigmentation over both anterior shins which appears to be remote change of ecchymosis.  There is almost bronzing patina to the skin.  General appearance: no acute distress, increased work of breathing is present.   Lymphatic: No lymphadenopathy about the head, neck, axilla. Eyes: No conjunctival inflammation or lid edema is present. There is no scleral icterus. Ears:  External ear exam shows no significant lesions or deformities.   Nose:  External nasal examination shows no deformity or inflammation.  Neck:  No thyromegaly, masses, tenderness noted.    Heart:  No definite gallop, murmur, click, rub.  Lungs:  without wheezes, rhonchi, rales, rubs. Abdomen:  no organomegaly, hernias, masses. GU: Deferred  Extremities:  No cyanosis, clubbing, edema. Neurologic exam: Balance, Rhomberg, finger to nose testing could not be completed due to clinical state Skin: Warm & dry .   See clinical summary under each active problem in the Problem List with associated updated therapeutic plan

## 2019-08-13 ENCOUNTER — Telehealth: Payer: Self-pay

## 2019-08-13 ENCOUNTER — Other Ambulatory Visit: Payer: Self-pay | Admitting: Cardiology

## 2019-08-13 NOTE — Telephone Encounter (Addendum)
Palliative Medicine RN Note: Our office received a call from pt's sister in law Naftali Carchi [phone]). She asked for assistance obtaining a letter from Dr Dow Adolph regarding Teondre's dementia diagnosis, as Shamell's brother is going to court for guardianship.  I returned her call. I directed her to medical records, as our team is separate from [facility], and we are unable to help her. Note that she is not listed as a contact for [employer]. I cannot find physician/provider documentation of dementia, so I'm unsure which provider which would be able to help her.  Margret Chance Veeda Virgo, RN, BSN, [facility] Palliative Medicine Team 08/13/2019 9:40 AM Office [phone]

## 2019-08-14 ENCOUNTER — Other Ambulatory Visit: Payer: Self-pay

## 2019-08-14 ENCOUNTER — Ambulatory Visit (INDEPENDENT_AMBULATORY_CARE_PROVIDER_SITE_OTHER): Payer: Medicare Other | Admitting: Adult Health

## 2019-08-14 ENCOUNTER — Encounter: Payer: Self-pay | Admitting: Adult Health

## 2019-08-14 ENCOUNTER — Non-Acute Institutional Stay (SKILLED_NURSING_FACILITY): Payer: Medicare Other | Admitting: Adult Health

## 2019-08-14 VITALS — BP 118/68 | HR 95 | Temp 98.1°F | Ht 68.0 in | Wt 118.2 lb

## 2019-08-14 DIAGNOSIS — R918 Other nonspecific abnormal finding of lung field: Secondary | ICD-10-CM | POA: Diagnosis not present

## 2019-08-14 DIAGNOSIS — Z23 Encounter for immunization: Secondary | ICD-10-CM

## 2019-08-14 DIAGNOSIS — G9341 Metabolic encephalopathy: Secondary | ICD-10-CM

## 2019-08-14 DIAGNOSIS — I6523 Occlusion and stenosis of bilateral carotid arteries: Secondary | ICD-10-CM

## 2019-08-14 DIAGNOSIS — J984 Other disorders of lung: Secondary | ICD-10-CM

## 2019-08-14 DIAGNOSIS — I2699 Other pulmonary embolism without acute cor pulmonale: Secondary | ICD-10-CM | POA: Diagnosis not present

## 2019-08-14 DIAGNOSIS — R338 Other retention of urine: Secondary | ICD-10-CM

## 2019-08-14 DIAGNOSIS — I5042 Chronic combined systolic (congestive) and diastolic (congestive) heart failure: Secondary | ICD-10-CM

## 2019-08-14 DIAGNOSIS — N179 Acute kidney failure, unspecified: Secondary | ICD-10-CM

## 2019-08-14 DIAGNOSIS — J982 Interstitial emphysema: Secondary | ICD-10-CM | POA: Diagnosis not present

## 2019-08-14 DIAGNOSIS — R5381 Other malaise: Secondary | ICD-10-CM | POA: Insufficient documentation

## 2019-08-14 NOTE — Patient Instructions (Addendum)
Portable Chest xray at facility .  Continue on Dysphagia diet .  Activity as tolerated. Continue with PT/OT .  Set up CT chest without contrast in 4 weeks  Follow up with Dr. Vassie Loll  In 4 weeks and As needed   Please contact office for sooner follow up if symptoms do not improve or worsen or seek emergency care

## 2019-08-14 NOTE — Progress Notes (Signed)
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 the aortic valve. There is moderate calcification of the aortic valve Comparison(s): No significant change from prior study. 06/08/2019. FINDINGS  Left Ventricle: Left ventricular ejection fraction, by estimation, is 25 to 30%. The left ventricle has severely decreased function. The left ventricle demonstrates global hypokinesis. The left ventricular internal cavity size was normal in size. There is no left ventricular hypertrophy. Left ventricular diastolic parameters are consistent with Grade I diastolic dysfunction (impaired relaxation). Normal left ventricular filling pressure. Right Ventricle: The right ventricular size is normal. No increase in right ventricular wall thickness. Right ventricular systolic function is normal. Left Atrium: Left atrial size was mildly dilated. Right Atrium: Right atrial size was mildly dilated. Pericardium: There is no evidence of pericardial effusion. Mitral Valve: The mitral valve is normal in structure. Trivial mitral valve regurgitation. Tricuspid Valve: The tricuspid valve is normal in structure. Tricuspid valve regurgitation is not demonstrated. No evidence of tricuspid stenosis. Aortic Valve: The aortic valve is tricuspid. . There is mild thickening and moderate calcification of  the aortic valve. Aortic valve regurgitation is trivial. No aortic stenosis is present. Mild aortic valve annular calcification. There is mild thickening of the aortic valve. There is moderate calcification of the aortic valve. Pulmonic Valve: The pulmonic valve was grossly normal. Pulmonic valve regurgitation is trivial. Aorta: The aortic root is normal in size and structure. Venous: The inferior vena cava was not well visualized. IAS/Shunts: No atrial level shunt detected by color flow Doppler.  LEFT VENTRICLE PLAX 2D LVIDd:         5.50 cm      Diastology LVIDs:         4.50 cm      LV e' lateral:   4.22 cm/s LV PW:         0.80 cm      LV E/e' lateral: 12.2 LV IVS:        0.90 cm      LV e' medial:    5.27 cm/s LVOT diam:     2.00  cm      LV E/e' medial:  9.8 LV SV:         57 LV SV Index:   35 LVOT Area:     3.14 cm  LV Volumes (MOD) LV vol d, MOD A2C: 210.0 ml LV vol d, MOD A4C: 160.0 ml LV vol s, MOD A2C: 136.0 ml LV vol s, MOD A4C: 124.0 ml LV SV MOD A2C:     74.0 ml LV SV MOD A4C:     160.0 ml LV SV MOD BP:      54.0 ml RIGHT VENTRICLE RV S prime:     12.30 cm/s TAPSE (M-mode): 2.2 cm LEFT ATRIUM             Index       RIGHT ATRIUM           Index LA diam:        3.60 cm 2.22 cm/m  RA Area:     14.80 cm LA Vol (A2C):   48.6 ml 30.03 ml/m RA Volume:   39.80 ml  24.59 ml/m LA Vol (A4C):   38.1 ml 23.54 ml/m LA Biplane Vol: 44.2 ml 27.31 ml/m  AORTIC VALVE LVOT Vmax:   108.00 cm/s LVOT Vmean:  66.000 cm/s LVOT VTI:    0.180 m  AORTA Ao Root diam: 3.30 cm MITRAL VALVE MV Area (PHT): 1.72 cm     SHUNTS MV Decel Time: 440 msec     Systemic VTI:  0.18 m MV E velocity: 51.40 cm/s   Systemic Diam: 2.00 cm MV A velocity: 113.00 cm/s MV E/A ratio:  0.45 Yates Decamp MD Electronically signed by Yates Decamp MD Signature Date/Time: 08/07/2019/3:00:09 PM    Final       No flowsheet data found.  No results found for: NITRICOXIDE      Assessment & Plan:   Pulmonary embolism (HCC) Bilateral PE and  pneumomediastinum patient is currently on Eliquis.  Continue on Eliquis as recommended.   Cavitary lesion of lung Cavitary lung lesions questionable etiology possibly infection and or infarction from previous PE.  Patient has been treated with aggressive antibiotics.  Check chest x-ray today.  We will have to do a portable at his facility as he is unable to complete one in our office.  We will check a CT chest in 6 weeks for follow-up.  Physical deconditioning Severe physical deconditioning continue with PT and OT.  Needs high-protein diet.     Rubye Oaks, NP 08/14/2019

## 2019-08-14 NOTE — Assessment & Plan Note (Signed)
Cavitary lung lesions questionable etiology possibly infection and or infarction from previous PE.  Patient has been treated with aggressive antibiotics.  Check chest x-ray today.  We will have to do a portable at his facility as he is unable to complete one in our office.  We will check a CT chest in 6 weeks for follow-up.

## 2019-08-14 NOTE — Assessment & Plan Note (Signed)
Severe physical deconditioning continue with PT and OT.  Needs high-protein diet.

## 2019-08-14 NOTE — Progress Notes (Addendum)
Location:  [facility] Room Number: 318-A Place of Service:  SNF (31) Provider:  Kenard Gower, DNP, FNP-BC  Patient Care Team: Patient, No Pcp Per as PCP - General (General Practice) Patient, No Pcp Per (General Practice)  Extended Emergency Contact Information Primary Emergency Contact: Rice, Walsh Mobile Phone: [phone] Relation: Brother Secondary Emergency Contact: Dorma Russell Mobile Phone: [phone] Relation: Friend  Code Status:  DNR  Goals of care: Advanced Directive information Advanced Directives 08/14/2019  Does Patient Have a Medical Advance Directive? Yes  Type of Advance Directive Out of facility DNR (pink MOST or yellow form)  Does patient want to make changes to medical advance directive? No - Patient declined  Copy of Healthcare Power of Attorney in Chart? -  Would patient like information on creating a medical advance directive? -  Pre-existing out of facility DNR order (yellow form or pink MOST form) Pink MOST form placed in chart (order not valid for inpatient use);Yellow form placed in chart (order not valid for inpatient use)  Some encounter information is confidential and restricted. Go to Review Flowsheets activity to see all data.     Chief Complaint  Patient presents with  . Acute Visit    Requesting pneumonia and shingles, palliative consult    HPI:  Pt is a 79 y.o. male seen today for an acute visit, is requesting vaccines and needing palliative consult.  He has PMH of PAD, essential hypertension, dyslipidemia, history of DVT and PE on Eliquis, cardiomyopathy with CHF and CAD.  He was admitted to [facility] on 08/08/19 post hospitalization 08/03/19 to 08/08/19. Caregiver found him laying on the floor and confused after several days. He had a fall a week before hospitalization. He lives alone, has lost wife, daughter and dog. SARS-CoV-2 PCR test was negative. CT chest with contrast showed  bilateral pulmonary embolism, large amount of pneumomediastinum and cavitary lesions on CT chest. He denies chest pain. 2D echo done on 08/06/19 showed LVEF 25-30%.   He was seen in the room today. He was sleeping but puts his thumbs up to answer queries. He put his right thumb up when asked how he is doing. He did not utter a word nor open his eyes. He was seen later that day to be on a wheelchair with eyes open. Social worker and NP talked to niece, Victorino Dike via phone. Niece agreed to have palliative consult.   Past Medical History:  Diagnosis Date  . Bilateral carotid artery disease (HCC)   . Cardiomyopathy (HCC) 05/2019  . CHF (congestive heart failure) (HCC)   . Critical lower limb ischemia   . DVT (deep venous thrombosis) (HCC)   . Dyspnea on exertion 05/30/2019  . Hyperlipidemia    takes Atorvastatin daily  . Hypertension    takes Amlodipine and Ramipril daily  . Joint swelling   . Nocturia   . Peripheral arterial disease (HCC)   . Pulmonary embolism (HCC)    Refer to VQ scan.    Past Surgical History:  Procedure Laterality Date  . BACK SURGERY    . colonosocpy    . ENDARTERECTOMY FEMORAL Right 02/25/2015   Procedure: ENDARTERECTOMYRight  FEMORAL Endarterectomy with profundaplasty.;  Surgeon: Sherren Kerns, MD;  Location: [facility] OR;  Service: Vascular;  Laterality: Right;  . FEMORAL-POPLITEAL BYPASS GRAFT Right 02/25/2015   Procedure: BYPASS GRAFT FEMORAL-POPLITEAL ARTERY using non-reversed saphenous vein.;  Surgeon: Sherren Kerns, MD;  Location: [facility] OR;  Service: Vascular;  Laterality: Right;  .  PERIPHERAL VASCULAR CATHETERIZATION N/A 01/24/2015   Procedure: Abdominal Aortogram;  Surgeon: Sherren Kerns, MD;  Location: [facility] INVASIVE CV LAB;  Service: Cardiovascular;  Laterality: N/A;    Allergies  Allergen Reactions  . Chocolate Hives  . Sulfur Rash    Outpatient Encounter Medications as of 08/14/2019  Medication Sig  . albuterol (VENTOLIN HFA) 108 (90 Base) MCG/ACT  inhaler Inhale 2 puffs into the lungs every 4 (four) hours as needed for wheezing or shortness of breath.   Marland Kitchen apixaban (ELIQUIS) 5 MG TABS tablet Take 2 tablets (10 mg total) by mouth 2 (two) times daily. Take 10 mg BID x 7 days (08/06/19-08/12/19) then Take 5 mg BID indefinitely  . aspirin 81 MG chewable tablet Chew 81 mg by mouth daily.   Marland Kitchen atorvastatin (LIPITOR) 20 MG tablet Take 1 tablet (20 mg total) by mouth daily.  . ferrous sulfate 325 (65 FE) MG tablet Take 1 tablet (325 mg total) by mouth daily with breakfast.  . folic acid (FOLVITE) 1 MG tablet Take 1 tablet (1 mg total) by mouth daily.  . Nutritional Supplement LIQD Take 120 mLs by mouth 3 (three) times daily between meals. MedPass  . [DISCONTINUED] sertraline (ZOLOFT) 25 MG tablet Take 1 tablet (25 mg total) by mouth daily.   No facility-administered encounter medications on file as of 08/14/2019.    Review of Systems  Unable to obtain due to being nonverbal    There is no immunization history on file for this patient. Pertinent  Health Maintenance Due  Topic Date Due  . PNA vac Low Risk Adult (1 of 2 - PCV13) Never done  . INFLUENZA VACCINE  11/11/2019   No flowsheet data found.   Vitals:   08/14/19 1050  BP: 104/68  Pulse: 71  Resp: 20  Temp: (!) 97.2 F (36.2 C)  TempSrc: Oral  Weight: 114 lb 9.6 oz (52 kg)  Height: 5\' 8"  (1.727 m)   Body mass index is 17.42 kg/m.  Physical Exam  GENERAL APPEARANCE:  In no acute distress.  SKIN:  Skin is warm and dry.  MOUTH and THROAT: Lips are without lesions. Oral mucosa is moist and without lesions.  RESPIRATORY: Breathing is even & unlabored, BS CTAB CARDIAC: RRR, no murmur,no extra heart sounds, no edema GI: Abdomen soft, normal BS, no masses, no tenderness GU:  Has foley catheter draining clear yellow urine.  NEUROLOGICAL: There is no tremor. Nonverbal PSYCHIATRIC: Nonverbal   Labs reviewed: Recent Labs    06/21/19 1426 07/09/19 1350 08/04/19 0322  08/05/19 0245 08/06/19 0035 08/07/19 0218 08/08/19 0815  NA 134   < >  --    < > 141 141 142  K 4.8   < >  --    < > 3.7 3.3* 3.8  CL 97   < >  --    < > 102 102 102  CO2 23   < >  --    < > 31 30 29   GLUCOSE 110*   < >  --    < > 127* 94 100*  BUN 38*   < >  --    < > 59* 31* 18  CREATININE 1.49*   < >  --    < > 1.78* 1.39* 1.48*  CALCIUM 9.1   < >  --    < > 8.3* 8.7* 9.0  MG 1.6  --  2.3  --  1.8  --   --   PHOS  --   --   --   --  2.8  --   --    < > = values in this interval not displayed.   Recent Labs    06/05/19 1528 07/09/19 1350 08/03/19 2223  AST 87* 42* 20  ALT 65* 37 19  ALKPHOS 110 113 63  BILITOT 0.8 1.1 0.5  PROT 6.7 7.2 4.5*  ALBUMIN 3.0* 2.4* 1.1*   Recent Labs    07/09/19 1309 07/09/19 1309 08/03/19 2223 08/03/19 2314 08/05/19 0245 08/06/19 0035 08/07/19 0218  WBC 13.1*   < > 14.0*   < > 11.1* 9.3 11.5*  NEUTROABS 11.7*  --  12.8*  --   --   --   --   HGB 10.6*   < > 8.5*   < > 9.3* 8.7* 9.5*  HCT 33.6*   < > 29.2*   < > 30.9* 28.7* 31.2*  MCV 91.6   < > 96.7   < > 91.7 92.6 92.0  PLT 418*   < > 429*   < > 386 354 332   < > = values in this interval not displayed.    Lab Results  Component Value Date   CHOL 174 02/10/2015   HDL 42 02/10/2015   LDLCALC 105 02/10/2015   TRIG 137 02/10/2015   CHOLHDL 4.1 02/10/2015    Significant Diagnostic Results in last 30 days:  CT Head Wo Contrast  Result Date: 08/04/2019 CLINICAL DATA:  Lying on the floor. EXAM: CT HEAD WITHOUT CONTRAST TECHNIQUE: Contiguous axial images were obtained from the base of the skull through the vertex without intravenous contrast. COMPARISON:  None. FINDINGS: Brain: There is mild cerebral atrophy with widening of the extra-axial spaces and ventricular dilatation. There are areas of decreased attenuation within the white matter tracts of the supratentorial brain, consistent with microvascular disease changes. Vascular: No hyperdense vessel or unexpected calcification. Skull:  Normal. Negative for fracture or focal lesion. Sinuses/Orbits: No acute finding. Other: None. IMPRESSION: 1. Generalized cerebral atrophy. 2. No acute intracranial abnormality. Electronically Signed   By: Aram Candela M.D.   On: 08/04/2019 02:11   CT Chest W Contrast  Result Date: 08/04/2019 CLINICAL DATA:  Found on the floor. EXAM: CT CHEST WITH CONTRAST TECHNIQUE: Multidetector CT imaging of the chest was performed during intravenous contrast administration. CONTRAST:  14mL OMNIPAQUE IOHEXOL 300 MG/ML  SOLN COMPARISON:  None. FINDINGS: Cardiovascular: A moderate amount of intraluminal low attenuation is seen involving multiple lower lobe branches of the right pulmonary artery (axial CT images 92 through 99, CT series number 4). A mild amount of intraluminal low attenuation is also seen involving several upper lobe branches of the left pulmonary artery (axial CT images 82 through 86, CT series number 4). There is marked severity calcification of the thoracic aorta without evidence of aneurysmal dilatation or dissection. Normal heart size. No pericardial effusion. Marked severity coronary artery calcification is seen. Mediastinum/Nodes: A large amount of air is seen throughout the mediastinum. This is seen at the level of the lung bases and extends superiorly to involve the soft tissues posterior and lateral to the oropharynx. Lungs/Pleura: There is moderate to marked severity emphysematous lung disease. A 9.2 cm x 4.4 cm x 4.2 cm lobulated, thick walled cavitary lesion is seen along the posterolateral aspect of the left upper lobe. A 2.9 cm x 1.1 cm x 1.4 cm thin walled cavitary lesion is seen along the posterolateral aspect of the right middle lobe. A 9 mm noncalcified lung nodule is seen within the posterior aspect of the  right lower lobe (axial CT image 93, CT series number 5). Upper Abdomen: A small amount of air density is seen posterior to the infrarenal abdominal aorta (axial CT images 153 through  157, CT series number 4), likely consistent with extension of the previously noted pneumomediastinum. Musculoskeletal: Multilevel degenerative changes seen throughout the thoracic spine. IMPRESSION: 1. Large amount of pneumomediastinum which extends superiorly to involve the soft tissues posterior and lateral to the oropharynx. 2. Mild to moderate severity bilateral pulmonary embolism, right greater than left. 3. 9.2 cm x 4.4 cm x 4.2 cm lobulated, thick walled left upper lobe cavitary lesion. This may represent a large cavitary pneumonia, however, a cavitary neoplasm cannot be excluded. 4. Additional smaller right middle lobe cavitary lesion with a 9 mm noncalcified right lower lobe lung nodule. Attention on follow-up is recommended. 5. Moderate to marked severity emphysematous lung disease. Aortic Atherosclerosis (ICD10-I70.0) and Emphysema (ICD10-J43.9). Electronically Signed   By: Aram Candela M.D.   On: 08/04/2019 02:40   CT Cervical Spine Wo Contrast  Result Date: 08/04/2019 CLINICAL DATA:  Found on the floor. EXAM: CT CERVICAL SPINE WITHOUT CONTRAST TECHNIQUE: Multidetector CT imaging of the cervical spine was performed without intravenous contrast. Multiplanar CT image reconstructions were also generated. COMPARISON:  July 09, 2019 FINDINGS: Alignment: There is approximately 2 mm anterolisthesis of the C4 on C5 vertebral body, with approximately 3 mm anterolisthesis of the C5 on C6 vertebral body. 3 mm anterolisthesis of C7 on T1 is also noted. Skull base and vertebrae: No acute fracture. No primary bone lesion or focal pathologic process. Soft tissues and spinal canal: No prevertebral fluid or swelling. No visible canal hematoma. Disc levels: Moderate severity endplate sclerosis is seen at the levels of C4-C5, C5-C6 and C6-C7. Moderate to marked severity intervertebral disc space narrowing is also seen at these levels. Marked severity posterior intervertebral disc space narrowing is seen at the  level of C3-C4. There is marked severity bilateral multilevel facet joint hypertrophy. Upper chest: A marked amount of air is seen within the visualized portion of the superior mediastinum. This extends superiorly to involve the soft tissues along the posterior and posterolateral aspect of the a moderate amount of soft tissue air is also seen along the supraclavicular region on the left. Oropharynx. Other: None. IMPRESSION: 1. Marked severity superior pneumomediastinum which extends superiorly to involve the soft tissues along the posterior and posterolateral aspect of the left supraclavicular region on the left. Further evaluation with a chest CT is recommended. 2. Marked severity multilevel degenerative changes, most prominent at the levels of C4-C5, C5-C6 and C6-C7. 3. 2 mm anterolisthesis of the C4 on C5 vertebral body, C5-C6 vertebral body, and C7-T1 vertebral bodies. Electronically Signed   By: Aram Candela M.D.   On: 08/04/2019 02:19   DG Chest Port 1 View  Result Date: 08/03/2019 CLINICAL DATA:  Altered mental status. EXAM: PORTABLE CHEST 1 VIEW COMPARISON:  Chest radiograph 07/09/2019. No prior chest CT. FINDINGS: Persistent airspace disease in the periphery of the left mid lung, now with air-filled component concerning for cavitation. Patchy opacities in the right lung have improved from prior exam. Normal heart size with unchanged mediastinal contours. Short lucency outlining the aortic arch was not seen on prior. Vertically oriented lucency projecting over the central left lung base. There is air in the left supraclavicular soft tissues. Possible tiny left pneumothorax. No significant pleural fluid. Underlying emphysema. No acute rib fractures are visualized. IMPRESSION: 1. Persistent airspace disease in the periphery of the  left mid lung, now with air-filled component concerning for cavitation. Bilateral patchy lung opacities have otherwise improved from radiograph last month. 2. Multiple  lucencies in the left hemithorax are subtle but suspicious for pneumothorax, including adjacent to the aortic arch, medial left lung base, and possibly left apex. 3. Soft tissue air in the left supraclavicular soft tissues. 4. Recommend further characterization with chest CT. These results were called by telephone at the time of interpretation on 08/03/2019 at 10:46 pm to PA [facility] , who verbally acknowledged these results. Electronically Signed   By: Narda Rutherford M.D.   On: 08/03/2019 22:47   DG Swallowing Func-Speech Pathology  Result Date: 08/07/2019 Objective Swallowing Evaluation: Type of Study: Bedside Swallow Evaluation  Patient Details Name: Kevin Reilly MRN: [id number] Date of Birth: [birth year] Today's Date: 08/07/2019 Time: SLP Start Time (ACUTE ONLY): 0850 -SLP Stop Time (ACUTE ONLY): 0919 SLP Time Calculation (min) (ACUTE ONLY): 29 min Past Medical History: Past Medical History: Diagnosis Date . Bilateral carotid artery disease (HCC)  . Cardiomyopathy (HCC) 05/2019 . CHF (congestive heart failure) (HCC)  . Critical lower limb ischemia  . DVT (deep venous thrombosis) (HCC)  . Dyspnea on exertion 05/30/2019 . Hyperlipidemia   takes Atorvastatin daily . Hypertension   takes Amlodipine and Ramipril daily . Joint swelling  . Nocturia  . Peripheral arterial disease (HCC)  . Pulmonary embolism (HCC)   Refer to VQ scan.  Past Surgical History: Past Surgical History: Procedure Laterality Date . BACK SURGERY   . colonosocpy   . ENDARTERECTOMY FEMORAL Right 02/25/2015  Procedure: ENDARTERECTOMYRight  FEMORAL Endarterectomy with profundaplasty.;  Surgeon: Sherren Kerns, MD;  Location: [facility] OR;  Service: Vascular;  Laterality: Right; . FEMORAL-POPLITEAL BYPASS GRAFT Right 02/25/2015  Procedure: BYPASS GRAFT FEMORAL-POPLITEAL ARTERY using non-reversed saphenous vein.;  Surgeon: Sherren Kerns, MD;  Location: [facility] OR;  Service: Vascular;  Laterality: Right; . PERIPHERAL VASCULAR CATHETERIZATION N/A  01/24/2015  Procedure: Abdominal Aortogram;  Surgeon: Sherren Kerns, MD;  Location: [facility] INVASIVE CV LAB;  Service: Cardiovascular;  Laterality: N/A; HPI: 79 yo male adm to [facility] with FTT, pneunomediastinum, cavitary lung lesions, bilateral PE, now on heparin per MD notes.  Per notes, pt's daughter, wife and dog passed and concern for depression present.  Pt denies dysphagia.  Swallow evaluation ordered.  Subjective: pt awake in chair Assessment / Plan / Recommendation CHL IP CLINICAL IMPRESSIONS 08/07/2019 Clinical Impression Patient presents with sensormotor based oropharyngeal dysphagia.  Weak muscle contraction and decreased oral control results in poor oral bolus cohesion and pharyngeal retention/aspiration.  Lingual pumping observed across consistencies with delay - SLP counting 1, 2, 3 - did not appear to improve efficency.  Pt aspirated thin liquids due to decreased timing and adequacy of laryngeal closure.  Reflexive cough noted with aspiration but did not clear aspirates.  Chin tuck posture prevented aspiration but allowed laryngeal penetration of thin.  Pharyngeal retention noted across all consistencies due to weakness without adequate sensation/awareness.  Pt does intermittent propel vallecular residuals into oral cavity using tongue base and expectorate= admitting he has done this prior to admissoin.  SLP recommends to continue current diet with strict precautions and allow single tsps of thin with chin tuck (with staff only).  Pt's swallowing appears laborious for him and concern for nutritional adequacy present.  Using teach back, educated pt to findings/recommendations.  Thankful palliative is following.  Marland Kitchen SLP Visit Diagnosis Dysphagia, oropharyngeal phase (R13.12) Attention and concentration deficit following -- Frontal lobe and executive  function deficit following -- Impact on safety and function Risk for inadequate nutrition/hydration;Moderate aspiration risk   CHL IP TREATMENT RECOMMENDATION  08/07/2019 Treatment Recommendations F/U MBS in --- days (Comment);Therapy as outlined in treatment plan below;Defer until completion of intrumental exam   Prognosis 08/07/2019 Prognosis for Safe Diet Advancement Guarded Barriers to Reach Goals Time post onset;Motivation Barriers/Prognosis Comment -- CHL IP DIET RECOMMENDATION 08/07/2019 SLP Diet Recommendations Dysphagia 3 (Mech soft) solids;Nectar thick liquid Liquid Administration via Cup;Straw Medication Administration Crushed with puree Compensations Slow rate;Small sips/bites Postural Changes Seated upright at 90 degrees;Remain semi-upright after after feeds/meals (Comment)   CHL IP OTHER RECOMMENDATIONS 08/07/2019 Recommended Consults -- Oral Care Recommendations Oral care QID Other Recommendations Have oral suction available   No flowsheet data found.  CHL IP FREQUENCY AND DURATION 08/07/2019 Speech Therapy Frequency (ACUTE ONLY) min 2x/week Treatment Duration 2 weeks      CHL IP ORAL PHASE 08/07/2019 Oral Phase Impaired Oral - Pudding Teaspoon -- Oral - Pudding Cup -- Oral - Honey Teaspoon -- Oral - Honey Cup -- Oral - Nectar Teaspoon Decreased bolus cohesion;Weak lingual manipulation;Delayed oral transit;Reduced posterior propulsion Oral - Nectar Cup Decreased bolus cohesion;Weak lingual manipulation;Delayed oral transit;Reduced posterior propulsion Oral - Nectar Straw Decreased bolus cohesion;Weak lingual manipulation;Delayed oral transit;Reduced posterior propulsion Oral - Thin Teaspoon Decreased bolus cohesion;Weak lingual manipulation;Reduced posterior propulsion;Premature spillage Oral - Thin Cup Decreased bolus cohesion;Weak lingual manipulation;Delayed oral transit;Reduced posterior propulsion;Premature spillage Oral - Thin Straw Decreased bolus cohesion;Weak lingual manipulation;Delayed oral transit;Reduced posterior propulsion Oral - Puree Decreased bolus cohesion;Weak lingual manipulation;Delayed oral transit;Reduced posterior propulsion Oral - Mech  Soft Decreased bolus cohesion;Weak lingual manipulation;Delayed oral transit;Reduced posterior propulsion Oral - Regular -- Oral - Multi-Consistency -- Oral - Pill -- Oral Phase - Comment --  CHL IP PHARYNGEAL PHASE 08/07/2019 Pharyngeal Phase Impaired Pharyngeal- Pudding Teaspoon -- Pharyngeal -- Pharyngeal- Pudding Cup -- Pharyngeal -- Pharyngeal- Honey Teaspoon -- Pharyngeal -- Pharyngeal- Honey Cup -- Pharyngeal -- Pharyngeal- Nectar Teaspoon Reduced epiglottic inversion;Reduced pharyngeal peristalsis;Reduced laryngeal elevation;Pharyngeal residue - pyriform;Pharyngeal residue - valleculae Pharyngeal Material does not enter airway Pharyngeal- Nectar Cup Pharyngeal residue - valleculae;Pharyngeal residue - pyriform;Reduced pharyngeal peristalsis;Reduced epiglottic inversion;Reduced anterior laryngeal mobility;Reduced laryngeal elevation;Reduced airway/laryngeal closure;Reduced tongue base retraction Pharyngeal Material does not enter airway Pharyngeal- Nectar Straw Pharyngeal residue - valleculae;Pharyngeal residue - pyriform;Reduced pharyngeal peristalsis;Reduced epiglottic inversion;Reduced anterior laryngeal mobility;Reduced laryngeal elevation;Reduced airway/laryngeal closure Pharyngeal Material does not enter airway Pharyngeal- Thin Teaspoon Penetration/Aspiration before swallow;Trace aspiration;Pharyngeal residue - valleculae;Pharyngeal residue - pyriform Pharyngeal Material enters airway, passes BELOW cords and not ejected out despite cough attempt by patient Pharyngeal- Thin Cup Delayed swallow initiation-pyriform sinuses;Penetration/Aspiration during swallow;Trace aspiration;Pharyngeal residue - valleculae;Pharyngeal residue - pyriform Pharyngeal Material enters airway, passes BELOW cords and not ejected out despite cough attempt by patient Pharyngeal- Thin Straw Penetration/Aspiration during swallow;Pharyngeal residue - valleculae;Pharyngeal residue - pyriform;Reduced pharyngeal peristalsis;Reduced  epiglottic inversion;Reduced anterior laryngeal mobility;Reduced laryngeal elevation;Reduced airway/laryngeal closure;Reduced tongue base retraction Pharyngeal Material enters airway, remains ABOVE vocal cords and not ejected out Pharyngeal- Puree Reduced pharyngeal peristalsis;Reduced tongue base retraction;Reduced laryngeal elevation;Reduced anterior laryngeal mobility;Reduced airway/laryngeal closure;Pharyngeal residue - valleculae Pharyngeal Material does not enter airway Pharyngeal- Mechanical Soft Reduced laryngeal elevation;Reduced anterior laryngeal mobility;Reduced epiglottic inversion;Reduced pharyngeal peristalsis;Reduced airway/laryngeal closure;Reduced tongue base retraction;Pharyngeal residue - valleculae Pharyngeal Material does not enter airway Pharyngeal- Regular -- Pharyngeal -- Pharyngeal- Multi-consistency -- Pharyngeal -- Pharyngeal- Pill -- Pharyngeal -- Pharyngeal Comment chin tuck prevent aspiration but allowed laryngeal penetration of thin and pt needed total cues to perform; head  turn left (pt leaning toward left) did not decrease  pharyngeal retention; pt tilts head posterior to aid oral transiting of boluses; pt propels boluses from pharynx into oral cavity and expectorates or re=swallows- stating he has done this prior to admission; pt with reflexive cough with aspiration events but cough did not clear aspirates  CHL IP CERVICAL ESOPHAGEAL PHASE 08/07/2019 Cervical Esophageal Phase Impaired Pudding Teaspoon -- Pudding Cup -- Honey Teaspoon -- Honey Cup -- Nectar Teaspoon -- Nectar Cup -- Nectar Straw -- Thin Teaspoon -- Thin Cup -- Thin Straw -- Puree -- Mechanical Soft -- Regular -- Multi-consistency -- Pill -- Cervical Esophageal Comment Prominent cricopharyngeus apparent x1 with nectar liquid did not impair barium flow; At end of testing,pt's esophagus appeared with retention distally without pt sensation Kathleen Lime, MS [facility] SLP Acute Rehab Services Office [phone] [name]  08/07/2019, 10:00 AM              ECHOCARDIOGRAM COMPLETE  Result Date: 08/07/2019    ECHOCARDIOGRAM REPORT   Patient Name:   TAJE LITTLER Franchi Date of Exam: 08/06/2019 Medical Rec #:  [id number]     Height:       68.0 in Accession #:    [id number]    Weight:       115.5 lb Date of Birth:  1940/04/28     BSA:          1.619 m Patient Age:    46 years      BP:           116/67 mmHg Patient Gender: M             HR:           81 bpm. Exam Location:  Inpatient Procedure: 2D Echo Indications:    Pulmonary Embolus 415.19 / I26.99  History:        Patient has prior history of Echocardiogram examinations, most                 recent 06/08/2019. CHF, CAD, PAD, Pulmonic Valve Disease; Risk                 Factors:Hypertension and Dyslipidemia. DVT, Bilateral carotid                 artery disease, Cardiomyopathy.  Sonographer:    Darlina Sicilian RDCS Referring Phys: [id number] [place]  1. Left ventricular ejection fraction, by estimation, is 25 to 30%. The left ventricle has severely decreased function. The left ventricle demonstrates global hypokinesis. Left ventricular diastolic parameters are consistent with Grade I diastolic dysfunction (impaired relaxation).  2. Right ventricular systolic function is normal. The right ventricular size is normal.  3. Left atrial size was mildly dilated.  4. Right atrial size was mildly dilated.  5. The mitral valve is normal in structure. Trivial mitral valve regurgitation.  6. The aortic valve is tricuspid. Aortic valve regurgitation is trivial. No aortic stenosis is present. Mild aortic valve annular calcification. There is mild thickening of the aortic valve. There is moderate calcification of the aortic valve Comparison(s): No significant change from prior study. 06/08/2019. FINDINGS  Left Ventricle: Left ventricular ejection fraction, by estimation, is 25 to 30%. The left ventricle has severely decreased function. The left ventricle demonstrates global hypokinesis. The  left ventricular internal cavity size was normal in size. There is no left ventricular hypertrophy. Left ventricular diastolic parameters are consistent with Grade I diastolic dysfunction (impaired relaxation). Normal left ventricular filling pressure.  Right Ventricle: The right ventricular size is normal. No increase in right ventricular wall thickness. Right ventricular systolic function is normal. Left Atrium: Left atrial size was mildly dilated. Right Atrium: Right atrial size was mildly dilated. Pericardium: There is no evidence of pericardial effusion. Mitral Valve: The mitral valve is normal in structure. Trivial mitral valve regurgitation. Tricuspid Valve: The tricuspid valve is normal in structure. Tricuspid valve regurgitation is not demonstrated. No evidence of tricuspid stenosis. Aortic Valve: The aortic valve is tricuspid. . There is mild thickening and moderate calcification of the aortic valve. Aortic valve regurgitation is trivial. No aortic stenosis is present. Mild aortic valve annular calcification. There is mild thickening of the aortic valve. There is moderate calcification of the aortic valve. Pulmonic Valve: The pulmonic valve was grossly normal. Pulmonic valve regurgitation is trivial. Aorta: The aortic root is normal in size and structure. Venous: The inferior vena cava was not well visualized. IAS/Shunts: No atrial level shunt detected by color flow Doppler.  LEFT VENTRICLE PLAX 2D LVIDd:         5.50 cm      Diastology LVIDs:         4.50 cm      LV e' lateral:   4.22 cm/s LV PW:         0.80 cm      LV E/e' lateral: 12.2 LV IVS:        0.90 cm      LV e' medial:    5.27 cm/s LVOT diam:     2.00 cm      LV E/e' medial:  9.8 LV SV:         57 LV SV Index:   35 LVOT Area:     3.14 cm  LV Volumes (MOD) LV vol d, MOD A2C: 210.0 ml LV vol d, MOD A4C: 160.0 ml LV vol s, MOD A2C: 136.0 ml LV vol s, MOD A4C: 124.0 ml LV SV MOD A2C:     74.0 ml LV SV MOD A4C:     160.0 ml LV SV MOD BP:      54.0  ml RIGHT VENTRICLE RV S prime:     12.30 cm/s TAPSE (M-mode): 2.2 cm LEFT ATRIUM             Index       RIGHT ATRIUM           Index LA diam:        3.60 cm 2.22 cm/m  RA Area:     14.80 cm LA Vol (A2C):   48.6 ml 30.03 ml/m RA Volume:   39.80 ml  24.59 ml/m LA Vol (A4C):   38.1 ml 23.54 ml/m LA Biplane Vol: 44.2 ml 27.31 ml/m  AORTIC VALVE LVOT Vmax:   108.00 cm/s LVOT Vmean:  66.000 cm/s LVOT VTI:    0.180 m  AORTA Ao Root diam: 3.30 cm MITRAL VALVE MV Area (PHT): 1.72 cm     SHUNTS MV Decel Time: 440 msec     Systemic VTI:  0.18 m MV E velocity: 51.40 cm/s   Systemic Diam: 2.00 cm MV A velocity: 113.00 cm/s MV E/A ratio:  0.45 Yates Decamp MD Electronically signed by Yates Decamp MD Signature Date/Time: 08/07/2019/3:00:09 PM    Final     Assessment/Plan  1. Acute metabolic encephalopathy - secondary to severe dehydration, acute infective process - CT head was negative  2. Pneumomediastinum (HCC) - suspected to be trauma related with no concurrent  pneumothorax - follow up with pulmonary -Palliative consult  3. 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 Cavitary lesion of lung - suspected to be community acquired pneumonia - - CT also showed additional smaller right middle lobe cavitary lesion with a 9 mm noncalcified right lower lobe lung nodule - pulmonary feels cavitary lesions are possibly secondary to prior PE with infarction versus necrotizing infection, less likely TB - completed Zosyn, 5/5, final blood cultures negative - recommended to have repeat CT scan in 6 weeks - follow-up with pulmonary  5. Chronic combined systolic and diastolic heart failure (HCC) - no SOB, diuretics on hold due to AKI - follow up with Dr. Jacinto Halim, cardiologist  6. Acute urinary retention -Foley catheter was inserted on 08/04/2019 and replaced on 4/28 -We will follow up with urology Dr. Sherron Monday for voiding  trial on 09/05/2019 at 9:30 AM  7. AKI -Was given IV fluids in the hospital, creatinine improved from 1.7 to 1.4 - will monitor  8.  Need for Prevnar 13 vaccine and shingles vaccine - nice wanted resident to have these vaccines -Ordered Prevnar- 13   0.5 mL IM x1 and Shingrix vaccine 0.5 mL IM x1   Family/ staff Communication:  Discussed plan of care with resident.  Labs/tests ordered:  CBC and BMP  Goals of care:  Short-term care   Kenard Gower, DNP, FNP-BC [facility] [phone] (Monday-Friday 8:00 a.m. - 5:00 p.m.) [phone] (after hours)

## 2019-08-14 NOTE — Assessment & Plan Note (Signed)
Bilateral PE and pneumomediastinum patient is currently on Eliquis.  Continue on Eliquis as recommended.

## 2019-08-15 ENCOUNTER — Other Ambulatory Visit: Payer: Self-pay | Admitting: Cardiology

## 2019-08-15 ENCOUNTER — Ambulatory Visit: Payer: Medicare Other | Admitting: Cardiology

## 2019-08-15 ENCOUNTER — Encounter: Payer: Self-pay | Admitting: Cardiology

## 2019-08-15 VITALS — BP 123/61 | HR 100 | Ht 68.0 in | Wt 118.0 lb

## 2019-08-15 DIAGNOSIS — I4510 Unspecified right bundle-branch block: Secondary | ICD-10-CM

## 2019-08-15 DIAGNOSIS — I82552 Chronic embolism and thrombosis of left peroneal vein: Secondary | ICD-10-CM

## 2019-08-15 DIAGNOSIS — I428 Other cardiomyopathies: Secondary | ICD-10-CM

## 2019-08-15 DIAGNOSIS — I5022 Chronic systolic (congestive) heart failure: Secondary | ICD-10-CM

## 2019-08-15 DIAGNOSIS — Z87891 Personal history of nicotine dependence: Secondary | ICD-10-CM

## 2019-08-15 DIAGNOSIS — E782 Mixed hyperlipidemia: Secondary | ICD-10-CM

## 2019-08-15 DIAGNOSIS — I10 Essential (primary) hypertension: Secondary | ICD-10-CM

## 2019-08-15 DIAGNOSIS — I509 Heart failure, unspecified: Secondary | ICD-10-CM

## 2019-08-15 DIAGNOSIS — I739 Peripheral vascular disease, unspecified: Secondary | ICD-10-CM

## 2019-08-15 LAB — CBC AND DIFFERENTIAL
HCT: 34 — AB (ref 41–53)
Hemoglobin: 10.7 — AB (ref 13.5–17.5)
Neutrophils Absolute: 10
Platelets: 392 (ref 150–399)
WBC: 12.6

## 2019-08-15 LAB — CBC: RBC: 3.8 — AB (ref 3.87–5.11)

## 2019-08-15 LAB — BASIC METABOLIC PANEL
BUN: 32 — AB (ref 4–21)
CO2: 23 — AB (ref 13–22)
Chloride: 102 (ref 99–108)
Creatinine: 1.7 — AB (ref 0.6–1.3)
Glucose: 113
Potassium: 4 (ref 3.4–5.3)
Sodium: 141 (ref 137–147)

## 2019-08-15 LAB — COMPREHENSIVE METABOLIC PANEL
Calcium: 9.2 (ref 8.7–10.7)
GFR calc Af Amer: 43.1
GFR calc non Af Amer: 37.19

## 2019-08-15 MED ORDER — CARVEDILOL 3.125 MG PO TABS: 3.1250 mg | ORAL_TABLET | Freq: Two times a day (BID) | ORAL | 0 refills | Status: DC

## 2019-08-15 NOTE — Progress Notes (Signed)
Chief Complaint  Patient presents with  . Acute combined systolic and diastolic heart failure    hospital follow up   HPI  Kevin Reilly is a 79 y.o. male who presents to the office with a chief complaint of "hospital follow-up and congestive heart failure management"  Patient's past medical history and cardiac risk factors include: Carotid artery disease, congestive heart failure, peripheral artery disease with prior intervention, hyperlipidemia, hypertension, cardiomyopathy, DVT, PE, medical noncompliance.  Patient was recently hospitalized at [facility] and then transferred to [facility] at the time of discharge.  He presents today to the office accompanied by one of the [place] associates who's name is  [name].   Patient appears frail in comparison to his prior office visits.  He appears older than his stated age.  I spoke to the patient personally but chooses not to respond to either open-ended or close ended questions.    At the last several office visits he was accompanied by Kevin Reilly ([phone]).  I asked the patient if I have his permission to talk to Ms. Kevin Reilly and he refuses.  I do not have any other family members whom I can talk to in regards to getting an update on his recent hospitalization or how he is currently feeling.  Therefore I took the liberty to call Dr. Alwyn Ren who is at [facility] I reviewed the case with him over the phone personally.    ALLERGIES: Allergies  Allergen Reactions  . Chocolate Hives  . Sulfur Rash   MEDICATION LIST PRIOR TO VISIT: Current Outpatient Medications on File Prior to Visit  Medication Sig Dispense Refill  . acetaminophen (TYLENOL) 325 MG tablet Take 325 mg by mouth every 6 (six) hours as needed.    Marland Kitchen albuterol (VENTOLIN HFA) 108 (90 Base) MCG/ACT inhaler Inhale 2 puffs into the lungs every 4 (four) hours as needed for wheezing or shortness of breath.     Marland Kitchen apixaban (ELIQUIS) 5 MG TABS tablet Take 2 tablets (10 mg total) by  mouth 2 (two) times daily. Take 10 mg BID x 7 days (08/06/19-08/12/19) then Take 5 mg BID indefinitely (Patient taking differently: Take 5 mg by mouth 2 (two) times daily. ) 60 tablet 2  . aspirin 81 MG chewable tablet Chew 81 mg by mouth daily.     Marland Kitchen atorvastatin (LIPITOR) 20 MG tablet Take 1 tablet (20 mg total) by mouth daily. 90 tablet 3  . doxycycline (VIBRAMYCIN) 100 MG capsule Take 100 mg by mouth 2 (two) times daily.    . ferrous sulfate 325 (65 FE) MG tablet Take 1 tablet (325 mg total) by mouth daily with breakfast. 60 tablet 0  . folic acid (FOLVITE) 1 MG tablet Take 1 tablet (1 mg total) by mouth daily. 60 tablet 0  . Nutritional Supplement LIQD Take 120 mLs by mouth 3 (three) times daily between meals. MedPass    . Saccharomyces boulardii (FLORASTORMAX PO) Take by mouth.     No current facility-administered medications on file prior to visit.    PAST MEDICAL HISTORY: Past Medical History:  Diagnosis Date  . Bilateral carotid artery disease (HCC)   . Cardiomyopathy (HCC) 05/2019  . CHF (congestive heart failure) (HCC)   . Critical lower limb ischemia   . DVT (deep venous thrombosis) (HCC)   . Dyspnea on exertion 05/30/2019  . Hyperlipidemia    takes Atorvastatin daily  . Hypertension    takes Amlodipine and Ramipril daily  . Joint swelling   .  Nocturia   . Peripheral arterial disease (HCC)   . Pulmonary embolism (HCC)    Refer to VQ scan.     PAST SURGICAL HISTORY: Past Surgical History:  Procedure Laterality Date  . BACK SURGERY    . colonosocpy    . ENDARTERECTOMY FEMORAL Right 02/25/2015   Procedure: ENDARTERECTOMYRight  FEMORAL Endarterectomy with profundaplasty.;  Surgeon: Sherren Kerns, MD;  Location[facility] OR;  Service: Vascular;  Laterality: Right;  . FEMORAL-POPLITEAL BYPASS GRAFT Right 02/25/2015   Procedure: BYPASS GRAFT FEMORAL-POPLITEAL ARTERY using non-reversed saphenous vein.;  Surgeon: Sherren Kerns, MD;  Location: [facility] OR;  Service: Vascular;   Laterality: Right;  . PERIPHERAL VASCULAR CATHETERIZATION N/A 01/24/2015   Procedure: Abdominal Aortogram;  Surgeon: Sherren Kerns, MD;  Location: [facility] INVASIVE CV LAB;  Service: Cardiovascular;  Laterality: N/A;    FAMILY HISTORY: The patient family history is not on file.  Denies family history of premature coronary disease or sudden cardiac death.   SOCIAL HISTORY:  The patient  reports that he has quit smoking. His smoking use included cigarettes. He has never used smokeless tobacco. He reports that he does not drink alcohol or use drugs.  14 ORGAN REVIEW OF SYSTEMS: Unable to obtain an accurate review of systems as the patient chooses not to answer questions.  But he denies any chest pain or shortness of breath.  PHYSICAL EXAM: Vitals with BMI 08/15/2019 08/14/2019 08/14/2019  Height 5\' 8"  5\' 8"  5\' 8"   Weight 118 lbs 118 lbs 3 oz 114 lbs 10 oz  BMI 17.95 17.98 17.43  Systolic 123 118 197  Diastolic 61 68 68  Pulse 100 95 71  Some encounter information is confidential and restricted. Go to Review Flowsheets activity to see all data.   CONSTITUTIONAL: Appearing frail, older than stated age, sitting in a wheelchair and continues to slouch down, hemodynamically stable, no acute distress.  SKIN: Skin is warm and dry. No rash noted. No cyanosis. No pallor. No jaundice HEAD: Normocephalic and atraumatic.  EYES: No scleral icterus MOUTH/THROAT: Dry oral membranes.  Poor dentition. NECK: JVP present. No thyromegaly noted.  Left-sided carotid bruit appreciated. LYMPHATIC: No visible cervical adenopathy.  CHEST Normal respiratory effort. No intercostal retractions  LUNGS: Decreased breath sounds at the bases.  No stridor. No wheezes.  Positive rales bilateral.  CARDIOVASCULAR: Tachycardia positive S1-S2, soft systolic murmur heard at the left sternal border, no gallops or rubs. ABDOMINAL: Soft, nontender, nondistended, positive bowel sounds, no apparent ascites.  Foley present EXTREMITIES:  Bilateral lower extremities are warm to touch, no pitting edema. HEMATOLOGIC: No significant bruising  RADIOLOGY: 05/19/2019:Persistent diffuse bilateral interstitial opacities with peripheral and basilar predominance, appear stable on the right and may be slightly increased on the left.  VQ Scan 05/2019: Multiple bilateral peripheral perfusion defects. Differential includes multifocal pneumonia versus multifocal pulmonary emboli. Favor pulmonary emboli. Recommend CTA (pulmonary angiogram) to evaluate for pulmonary emboli versus pneumonia.  CARDIAC DATABASE: EKG: 05/19/2019 Normal sinus rhythm with a ventricular rate of 91 bpm, left atrial enlargement, normal axis, downsloping ST depressions in the inferior and lateral leads suggestive of underlying ischemia or secondary to LVH.   05/30/2015: Normal sinus rhythm with a ventricular rate of 90 bpm, left atrial enlargement.  Right bundle branch block, ST depressions in the high lateral and lateral leads suggestive of possible ischemia in the anterolateral distribution.  Echocardiogram: 06/08/2019: Severely depressed LV systolic function with visual EF <20%. Left ventricle cavity is visually dilated in size. Dilated cardiomyopathy. Hypokinetic global  wall motion. Doppler evidence of grade II diastolic dysfunction, elevated LAP. Mild left ventricular hypertrophy. Calculated EF 22%. Biatrial dilatation. Right ventricle cavity visually appears dilated with reduced systolic function. Mild (Grade I) mitral regurgitation. Moderate to severe tricuspid regurgitation. Mild to moderate pulmonary hypertension. RVSP measures 51 mmHg. IVC is dilated with a respiratory response of <50%.   08/06/2019 LVEF 25-30%, severely reduced left ventricular systolic function with global hypokinesis.  Grade 1 diastolic impairment.  Normal right ventricular size and function.  Biatrial dilatation.  Trivial MR and trivial TR.  Stress Testing:  None  Heart  Catheterization: None  Carotid duplex: 01/2015: Technically diffiuclt due to tortuosity and turbulent flow. - Bilateral - 40% to 59% ICA stenosis upper end of scale. Acoustic shadowing may obscure higher velocities. Turbulent flow noted. ECA stenosis. vertebral artey flow is antegrade.   Vascular imaging: Right Lower Extremity Venous Duplex Evaluation 02/2015: - No evidence of deep vein thrombosis involving the right lower extremity and left common femoral vein. Strong monophasic waveforms at the distal PTA and ATA.    DVT Study 11/07/2018:  Right: There is no evidence of deep vein thrombosis in the lower extremity. Left: No evidence of common femoral vein obstruction.   US Duplex LE Bilateral 06/06/2019: Cannot exclude partially occluding thrombosis of the right posterior tibial vein with normal venous return. Totally occluding chronic thrombosis of the left femoral and popliteal veins with no venous return. Patient was made aware and anticoagulation recommended.  ABI 04/23/2015: Right ABI 0.81, indicating mild arterial occlusive disease at rest.  Left ABI 0.65, indicating moderate arterial occlusive disease at rest.  LABORATORY DATA: CBC Latest Ref Rng & Units 08/07/2019 08/06/2019 08/05/2019  WBC 4.0 - 10.5 K/uL 11.5(H) 9.3 11.1(H)  Hemoglobin 13.0 - 17.0 g/dL 9.5(L) 8.7(L) 9.3(L)  Hematocrit 39.0 - 52.0 % 31.2(L) 28.7(L) 30.9(L)  Platelets 150 - 400 K/uL 332 354 386    CMP Latest Ref Rng & Units 08/08/2019 08/07/2019 08/06/2019  Glucose 70 - 99 mg/dL 100(H) 94 127(H)  BUN 8 - 23 mg/dL 18 31(H) 59(H)  Creatinine 0.61 - 1.24 mg/dL 1.48(H) 1.39(H) 1.78(H)  Sodium 135 - 145 mmol/L 142 141 141  Potassium 3.5 - 5.1 mmol/L 3.8 3.3(L) 3.7  Chloride 98 - 111 mmol/L 102 102 102  CO2 22 - 32 mmol/L 29 30 31   Calcium 8.9 - 10.3 mg/dL 9.0 8.7(L) 8.3(L)  Total Protein 6.5 - 8.1 g/dL - - -  Total Bilirubin 0.3 - 1.2 mg/dL - - -  Alkaline Phos 38 - 126 U/L - - -  AST 15 - 41 U/L - - -  ALT 0 - 44  U/L - - -    Lipid Panel     Component Value Date/Time   CHOL 174 02/10/2015 0855   TRIG 137 02/10/2015 0855   HDL 42 02/10/2015 0855   CHOLHDL 4.1 02/10/2015 0855   VLDL 27 02/10/2015 0855   LDLCALC 105 02/10/2015 0855   BNP (last 3 results) Recent Labs    05/27/19 1302 07/09/19 1515 08/03/19 2321  BNP 2,940.0* 986.8* 94.1   FINAL MEDICATION LIST END OF ENCOUNTER: Meds ordered this encounter  Medications  . carvedilol (COREG) 3.125 MG tablet    Sig: Take 1 tablet (3.125 mg total) by mouth 2 (two) times daily with a meal. Hold if systolic blood pressure (top blood pressure number) less than 100 mmHg or heart rate less than 60 bpm (pulse).    Dispense:  180 tablet    Refill:  0    There are no discontinued medications.   Current Outpatient Medications:  .  acetaminophen (TYLENOL) 325 MG tablet, Take 325 mg by mouth every 6 (six) hours as needed., Disp: , Rfl:  .  albuterol (VENTOLIN HFA) 108 (90 Base) MCG/ACT inhaler, Inhale 2 puffs into the lungs every 4 (four) hours as needed for wheezing or shortness of breath. , Disp: , Rfl:  .  apixaban (ELIQUIS) 5 MG TABS tablet, Take 2 tablets (10 mg total) by mouth 2 (two) times daily. Take 10 mg BID x 7 days (08/06/19-08/12/19) then Take 5 mg BID indefinitely (Patient taking differently: Take 5 mg by mouth 2 (two) times daily. ), Disp: 60 tablet, Rfl: 2 .  aspirin 81 MG chewable tablet, Chew 81 mg by mouth daily. , Disp: , Rfl:  .  atorvastatin (LIPITOR) 20 MG tablet, Take 1 tablet (20 mg total) by mouth daily., Disp: 90 tablet, Rfl: 3 .  doxycycline (VIBRAMYCIN) 100 MG capsule, Take 100 mg by mouth 2 (two) times daily., Disp: , Rfl:  .  ferrous sulfate 325 (65 FE) MG tablet, Take 1 tablet (325 mg total) by mouth daily with breakfast., Disp: 60 tablet, Rfl: 0 .  folic acid (FOLVITE) 1 MG tablet, Take 1 tablet (1 mg total) by mouth daily., Disp: 60 tablet, Rfl: 0 .  Nutritional Supplement LIQD, Take 120 mLs by mouth 3 (three) times daily  between meals. MedPass, Disp: , Rfl:  .  Saccharomyces boulardii (FLORASTORMAX PO), Take by mouth., Disp: , Rfl:  .  carvedilol (COREG) 3.125 MG tablet, Take 1 tablet (3.125 mg total) by mouth 2 (two) times daily with a meal. Hold if systolic blood pressure (top blood pressure number) less than 100 mmHg or heart rate less than 60 bpm (pulse)., Disp: 180 tablet, Rfl: 0  IMPRESSION:    ICD-10-CM   1. Chronic HFrEF, stage C, NYHA class II (HCC)  I50.22 carvedilol (COREG) 3.125 MG tablet  2. Cardiomyopathy  I42.8   3. PAD (peripheral artery disease) (HCC)  I73.9   4. Essential hypertension  I10   5. Mixed hyperlipidemia  E78.2   6. RBBB  I45.10   7. 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 Former smoker  Z87.891      RECOMMENDATIONS: Acute decompensated heart failure with reduced EF, stage C, NYHA class II.  Clinically since last office visit he appears more cachectic and frail and older than his stated age as mentioned above.  He chooses not to respond to questions in regards to obtaining his history of present illness.  Clinically he is euvolemic after recent hospitalization.    Most recent echocardiogram results reviewed.    Medications reconciled.  Tried to discuss goals of care with the patient but was unsuccessful.  Continue aspirin.  Continue statin therapy.  We will start carvedilol 3.125 mg p.o. twice daily with holding parameters.  If hemodynamics are stable after the initiation of carvedilol would recommend initiation of Entresto.  Recommend daily weight check and strict I's and O's.  Fluid restriction to less than 2 L/day, and sodium restrictions to less than 2 g/day.  Discussed the case and recommendations with Dr. Alwyn Ren who is the physician on staff at the [facility] facility.   Cardiomyopathy etiology unknown:  Has refused to undergo left heart catheterization despite having it scheduled twice and patient was a  no-show both times.  For now recommend conservative management as per patient's goals  of care.  Medications reconciled and will uptitrate.  Deep venous thrombosis: Continue Eliquis.  Patient does not endorse any evidence of bleeding.  Benign essential hypertension: See above.  Currently managed by primary team.  Peripheral artery disease with prior intervention: Chronic and stable.  Continue aspirin and statin therapy.  No orders of the defined types were placed in this encounter.  --Continue cardiac medications as reconciled in final medication list. --Return in about 3 months (around 11/15/2019).. Or sooner if needed. --Continue follow-up with your primary care physician regarding the management of your other chronic comorbid conditions.  Total encounter time 60 minutes. *Total Encounter Time as defined by the Centers for Medicare and Medicaid Services includes, in addition to the face-to-face time of a patient visit (documented in the note above) non-face-to-face time: obtaining and reviewing outside history, ordering and reviewing medications, tests or procedures, care coordination, discussing case with Dr. Alwyn Ren at [facility] facility (communications with other health care professionals or caregivers) and documentation in the medical record.  Patient's questions and concerns were addressed to his satisfaction. He voices understanding of the instructions provided during this encounter.   This note was created using a voice recognition software as a result there may be grammatical errors inadvertently enclosed that do not reflect the nature of this encounter. Every attempt is made to correct such errors.  Tessa Lerner, [place], [facility]  Pager: [phone] Office: [phone]

## 2019-08-16 ENCOUNTER — Encounter: Payer: Self-pay | Admitting: Adult Health

## 2019-08-16 ENCOUNTER — Non-Acute Institutional Stay (SKILLED_NURSING_FACILITY): Payer: Medicare Other | Admitting: Adult Health

## 2019-08-16 DIAGNOSIS — I2699 Other pulmonary embolism without acute cor pulmonale: Secondary | ICD-10-CM | POA: Diagnosis not present

## 2019-08-16 DIAGNOSIS — J982 Interstitial emphysema: Secondary | ICD-10-CM | POA: Diagnosis not present

## 2019-08-16 DIAGNOSIS — Z7189 Other specified counseling: Secondary | ICD-10-CM | POA: Diagnosis not present

## 2019-08-16 DIAGNOSIS — J984 Other disorders of lung: Secondary | ICD-10-CM

## 2019-08-16 DIAGNOSIS — I5042 Chronic combined systolic (congestive) and diastolic (congestive) heart failure: Secondary | ICD-10-CM

## 2019-08-16 DIAGNOSIS — R339 Retention of urine, unspecified: Secondary | ICD-10-CM

## 2019-08-16 NOTE — Progress Notes (Signed)
Location:  [facility] Room Number: 318-A Place of Service:  SNF (31) Provider:  Kenard Gower, DNP, FNP-BC  Patient Care Team: Patient, No Pcp Per as PCP - General (General Practice) Patient, No Pcp Per (General Practice)  Extended Emergency Contact Information Primary Emergency Contact: Kevin Reilly,Kevin Reilly  [place] Home Phone: [phone] Mobile Phone: [phone] Relation: Niece Secondary Emergency Contact: Reilly, Kevin Mobile Phone: [phone] Relation: Brother  Code Status:  DNR  Goals of care: Advanced Directive information Advanced Directives 08/17/2019  Does Patient Have a Medical Advance Directive? Yes  Type of Advance Directive Out of facility DNR (pink MOST or yellow form)  Does patient want to make changes to medical advance directive? No - Patient declined  Copy of Healthcare Power of Attorney in Chart? -  Would patient like information on creating a medical advance directive? -  Pre-existing out of facility DNR order (yellow form or pink MOST form) Pink MOST form placed in chart (order not valid for inpatient use)  Some encounter information is confidential and restricted. Go to Review Flowsheets activity to see all data.    Chief Complaint  Patient presents with  . Advanced Directive    Patient was seen for a Care Plan Meeting    HPI:  Pt is a 79 y.o. male who had advance care planning today attended by social worker, MDS coordinator, NP and resident's niece, Kevin Reilly, who attended via teleconference. Code status remains to be DNR. Niece plans for resident to have short-term care and then back to his home. Niece will apply for her to be the medical power of attorney and let the state run the financial side. 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 Niece talked about backed up taxes for the business, house having a hole on the ceiling and resident not showing up in doctors' appointment. Discussed medications,  vital signs and weights. Answered queries from niece. He has PMH of PAD, essential hypertension, dyslipidemia, history of DVT and PE on Eliquis, cardiomyopathy with CHF and CAD. The meeting lasted for 25 minutes.   Past Medical History:  Diagnosis Date  . Bilateral carotid artery disease (HCC)   . Cardiomyopathy (HCC) 05/2019  . CHF (congestive heart failure) (HCC)   . Critical lower limb ischemia   . DVT (deep venous thrombosis) (HCC)   . Dyspnea on exertion 05/30/2019  . Hyperlipidemia    takes Atorvastatin daily  . Hypertension    takes Amlodipine and Ramipril daily  . Joint swelling   . Nocturia   . Peripheral arterial disease (HCC)   . Pulmonary embolism (HCC)    Refer to VQ scan.    Past Surgical History:  Procedure Laterality Date  . BACK SURGERY    . colonosocpy    . ENDARTERECTOMY FEMORAL Right 02/25/2015   Procedure: ENDARTERECTOMYRight  FEMORAL Endarterectomy with profundaplasty.;  Surgeon: Sherren Kerns, MD;  Location: [facility] OR;  Service: Vascular;  Laterality: Right;  . FEMORAL-POPLITEAL BYPASS GRAFT Right 02/25/2015   Procedure: BYPASS GRAFT FEMORAL-POPLITEAL ARTERY using non-reversed saphenous vein.;  Surgeon: Sherren Kerns, MD;  Location: [facility] OR;  Service: Vascular;  Laterality: Right;  . PERIPHERAL VASCULAR CATHETERIZATION N/A 01/24/2015   Procedure: Abdominal Aortogram;  Surgeon: Sherren Kerns, MD;  Location: [facility] INVASIVE CV LAB;  Service: Cardiovascular;  Laterality: N/A;    Allergies  Allergen Reactions  . Chocolate Hives  . Sulfur Rash    Outpatient Encounter Medications as of 08/16/2019  Medication Sig  . acetaminophen (TYLENOL) 325  MG tablet Take 325 mg by mouth every 6 (six) hours as needed.  Marland Kitchen albuterol (VENTOLIN HFA) 108 (90 Base) MCG/ACT inhaler Inhale 2 puffs into the lungs every 4 (four) hours as needed for wheezing or shortness of breath.   Marland Kitchen apixaban (ELIQUIS) 5 MG TABS tablet Take 5 mg by mouth 2 (two) times daily.  Marland Kitchen aspirin 81 MG chewable  tablet Chew 81 mg by mouth daily.   Marland Kitchen atorvastatin (LIPITOR) 20 MG tablet Take 1 tablet (20 mg total) by mouth daily.  Marland Kitchen doxycycline (VIBRAMYCIN) 100 MG capsule Take 100 mg by mouth 2 (two) times daily.  . ferrous sulfate 325 (65 FE) MG tablet Take 1 tablet (325 mg total) by mouth daily with breakfast.  . folic acid (FOLVITE) 1 MG tablet Take 1 tablet (1 mg total) by mouth daily.  . Nutritional Supplement LIQD Take 120 mLs by mouth 3 (three) times daily between meals. MedPass  . Saccharomyces boulardii (FLORASTORMAX PO) Take by mouth.  . [DISCONTINUED] apixaban (ELIQUIS) 5 MG TABS tablet Take 2 tablets (10 mg total) by mouth 2 (two) times daily. Take 10 mg BID x 7 days (08/06/19-08/12/19) then Take 5 mg BID indefinitely  . [DISCONTINUED] carvedilol (COREG) 3.125 MG tablet Take 1 tablet (3.125 mg total) by mouth 2 (two) times daily with a meal. Hold if systolic blood pressure (top blood pressure number) less than 100 mmHg or heart rate less than 60 bpm (pulse).   No facility-administered encounter medications on file as of 08/16/2019.    Review of Systems  Unable to obtain due to dementia    There is no immunization history on file for this patient. Pertinent  Health Maintenance Due  Topic Date Due  . PNA vac Low Risk Adult (1 of 2 - PCV13) Never done  . INFLUENZA VACCINE  11/11/2019    Vitals:   08/16/19 1647  BP: 130/65  Pulse: 75  Resp: 16  Temp: (!) 97.5 F (36.4 C)  TempSrc: Oral  Weight: 112 lb 6.4 oz (51 kg)  Height: 5\' 8"  (1.727 m)   Body mass index is 17.09 kg/m.  Physical Exam  GENERAL APPEARANCE:  In no acute distress. Cachectic. SKIN:  Skin is warm and dry. MOUTH and THROAT: Lips are without lesions. Oral mucosa is moist and without lesions.  RESPIRATORY: Breathing is even & unlabored, BS CTAB CARDIAC: RRR, no murmur,no extra heart sounds, no edema GI: Abdomen soft, normal BS, no masses, no tenderness, NEUROLOGICAL: There is no tremor. Does not respond to  queries. PSYCHIATRIC:  Affect and behavior are appropriate  Labs reviewed: Recent Labs    06/21/19 1426 07/09/19 1350 08/04/19 0322 08/05/19 0245 08/06/19 0035 08/07/19 0218 08/08/19 0815  NA 134   < >  --    < > 141 141 142  K 4.8   < >  --    < > 3.7 3.3* 3.8  CL 97   < >  --    < > 102 102 102  CO2 23   < >  --    < > 31 30 29   GLUCOSE 110*   < >  --    < > 127* 94 100*  BUN 38*   < >  --    < > 59* 31* 18  CREATININE 1.49*   < >  --    < > 1.78* 1.39* 1.48*  CALCIUM 9.1   < >  --    < > 8.3* 8.7* 9.0  MG 1.6  --  2.3  --  1.8  --   --   PHOS  --   --   --   --  2.8  --   --    < > = values in this interval not displayed.   Recent Labs    06/05/19 1528 07/09/19 1350 08/03/19 2223  AST 87* 42* 20  ALT 65* 37 19  ALKPHOS 110 113 63  BILITOT 0.8 1.1 0.5  PROT 6.7 7.2 4.5*  ALBUMIN 3.0* 2.4* 1.1*   Recent Labs    07/09/19 1309 07/09/19 1309 08/03/19 2223 08/03/19 2314 08/05/19 0245 08/06/19 0035 08/07/19 0218  WBC 13.1*   < > 14.0*   < > 11.1* 9.3 11.5*  NEUTROABS 11.7*  --  12.8*  --   --   --   --   HGB 10.6*   < > 8.5*   < > 9.3* 8.7* 9.5*  HCT 33.6*   < > 29.2*   < > 30.9* 28.7* 31.2*  MCV 91.6   < > 96.7   < > 91.7 92.6 92.0  PLT 418*   < > 429*   < > 386 354 332   < > = values in this interval not displayed.    Lab Results  Component Value Date   CHOL 174 02/10/2015   HDL 42 02/10/2015   LDLCALC 105 02/10/2015   TRIG 137 02/10/2015   CHOLHDL 4.1 02/10/2015    Significant Diagnostic Results in last 30 days:  CT Head Wo Contrast  Result Date: 08/04/2019 CLINICAL DATA:  Lying on the floor. EXAM: CT HEAD WITHOUT CONTRAST TECHNIQUE: Contiguous axial images were obtained from the base of the skull through the vertex without intravenous contrast. COMPARISON:  None. FINDINGS: Brain: There is mild cerebral atrophy with widening of the extra-axial spaces and ventricular dilatation. There are areas of decreased attenuation within the white matter tracts of  the supratentorial brain, consistent with microvascular disease changes. Vascular: No hyperdense vessel or unexpected calcification. Skull: Normal. Negative for fracture or focal lesion. Sinuses/Orbits: No acute finding. Other: None. IMPRESSION: 1. Generalized cerebral atrophy. 2. No acute intracranial abnormality. Electronically Signed   By: Aram Candela M.D.   On: 08/04/2019 02:11   CT Chest W Contrast  Result Date: 08/04/2019 CLINICAL DATA:  Found on the floor. EXAM: CT CHEST WITH CONTRAST TECHNIQUE: Multidetector CT imaging of the chest was performed during intravenous contrast administration. CONTRAST:  17mL OMNIPAQUE IOHEXOL 300 MG/ML  SOLN COMPARISON:  None. FINDINGS: Cardiovascular: A moderate amount of intraluminal low attenuation is seen involving multiple lower lobe branches of the right pulmonary artery (axial CT images 92 through 99, CT series number 4). A mild amount of intraluminal low attenuation is also seen involving several upper lobe branches of the left pulmonary artery (axial CT images 82 through 86, CT series number 4). There is marked severity calcification of the thoracic aorta without evidence of aneurysmal dilatation or dissection. Normal heart size. No pericardial effusion. Marked severity coronary artery calcification is seen. Mediastinum/Nodes: A large amount of air is seen throughout the mediastinum. This is seen at the level of the lung bases and extends superiorly to involve the soft tissues posterior and lateral to the oropharynx. Lungs/Pleura: There is moderate to marked severity emphysematous lung disease. A 9.2 cm x 4.4 cm x 4.2 cm lobulated, thick walled cavitary lesion is seen along the posterolateral aspect of the left upper lobe. A 2.9 cm x 1.1 cm x 1.4  cm thin walled cavitary lesion is seen along the posterolateral aspect of the right middle lobe. A 9 mm noncalcified lung nodule is seen within the posterior aspect of the right lower lobe (axial CT image 93, CT  series number 5). Upper Abdomen: A small amount of air density is seen posterior to the infrarenal abdominal aorta (axial CT images 153 through 157, CT series number 4), likely consistent with extension of the previously noted pneumomediastinum. Musculoskeletal: Multilevel degenerative changes seen throughout the thoracic spine. IMPRESSION: 1. Large amount of pneumomediastinum which extends superiorly to involve the soft tissues posterior and lateral to the oropharynx. 2. Mild to moderate severity bilateral pulmonary embolism, right greater than left. 3. 9.2 cm x 4.4 cm x 4.2 cm lobulated, thick walled left upper lobe cavitary lesion. This may represent a large cavitary pneumonia, however, a cavitary neoplasm cannot be excluded. 4. Additional smaller right middle lobe cavitary lesion with a 9 mm noncalcified right lower lobe lung nodule. Attention on follow-up is recommended. 5. Moderate to marked severity emphysematous lung disease. Aortic Atherosclerosis (ICD10-I70.0) and Emphysema (ICD10-J43.9). Electronically Signed   By: Aram Candela M.D.   On: 08/04/2019 02:40   CT Cervical Spine Wo Contrast  Result Date: 08/04/2019 CLINICAL DATA:  Found on the floor. EXAM: CT CERVICAL SPINE WITHOUT CONTRAST TECHNIQUE: Multidetector CT imaging of the cervical spine was performed without intravenous contrast. Multiplanar CT image reconstructions were also generated. COMPARISON:  July 09, 2019 FINDINGS: Alignment: There is approximately 2 mm anterolisthesis of the C4 on C5 vertebral body, with approximately 3 mm anterolisthesis of the C5 on C6 vertebral body. 3 mm anterolisthesis of C7 on T1 is also noted. Skull base and vertebrae: No acute fracture. No primary bone lesion or focal pathologic process. Soft tissues and spinal canal: No prevertebral fluid or swelling. No visible canal hematoma. Disc levels: Moderate severity endplate sclerosis is seen at the levels of C4-C5, C5-C6 and C6-C7. Moderate to marked severity  intervertebral disc space narrowing is also seen at these levels. Marked severity posterior intervertebral disc space narrowing is seen at the level of C3-C4. There is marked severity bilateral multilevel facet joint hypertrophy. Upper chest: A marked amount of air is seen within the visualized portion of the superior mediastinum. This extends superiorly to involve the soft tissues along the posterior and posterolateral aspect of the a moderate amount of soft tissue air is also seen along the supraclavicular region on the left. Oropharynx. Other: None. IMPRESSION: 1. Marked severity superior pneumomediastinum which extends superiorly to involve the soft tissues along the posterior and posterolateral aspect of the left supraclavicular region on the left. Further evaluation with a chest CT is recommended. 2. Marked severity multilevel degenerative changes, most prominent at the levels of C4-C5, C5-C6 and C6-C7. 3. 2 mm anterolisthesis of the C4 on C5 vertebral body, C5-C6 vertebral body, and C7-T1 vertebral bodies. Electronically Signed   By: Aram Candela M.D.   On: 08/04/2019 02:19   DG Chest Port 1 View  Result Date: 08/03/2019 CLINICAL DATA:  Altered mental status. EXAM: PORTABLE CHEST 1 VIEW COMPARISON:  Chest radiograph 07/09/2019. No prior chest CT. FINDINGS: Persistent airspace disease in the periphery of the left mid lung, now with air-filled component concerning for cavitation. Patchy opacities in the right lung have improved from prior exam. Normal heart size with unchanged mediastinal contours. Short lucency outlining the aortic arch was not seen on prior. Vertically oriented lucency projecting over the central left lung base. There is air in the  left supraclavicular soft tissues. Possible tiny left pneumothorax. No significant pleural fluid. Underlying emphysema. No acute rib fractures are visualized. IMPRESSION: 1. Persistent airspace disease in the periphery of the left mid lung, now with  air-filled component concerning for cavitation. Bilateral patchy lung opacities have otherwise improved from radiograph last month. 2. Multiple lucencies in the left hemithorax are subtle but suspicious for pneumothorax, including adjacent to the aortic arch, medial left lung base, and possibly left apex. 3. Soft tissue air in the left supraclavicular soft tissues. 4. Recommend further characterization with chest CT. These results were called by telephone at the time of interpretation on 08/03/2019 at 10:46 pm to PA [facility] , who verbally acknowledged these results. Electronically Signed   By: Narda Rutherford M.D.   On: 08/03/2019 22:47   DG Swallowing Func-Speech Pathology  Result Date: 08/07/2019 Objective Swallowing Evaluation: Type of Study: Bedside Swallow Evaluation  Patient Details Name: KEDAR SEDANO MRN: [id number] Date of Birth: 29-May-1940 Today's Date: 08/07/2019 Time: SLP Start Time (ACUTE ONLY): 0850 -SLP Stop Time (ACUTE ONLY): 0919 SLP Time Calculation (min) (ACUTE ONLY): 29 min Past Medical History: Past Medical History: Diagnosis Date . Bilateral carotid artery disease (HCC)  . Cardiomyopathy (HCC) 05/2019 . CHF (congestive heart failure) (HCC)  . Critical lower limb ischemia  . DVT (deep venous thrombosis) (HCC)  . Dyspnea on exertion 05/30/2019 . Hyperlipidemia   takes Atorvastatin daily . Hypertension   takes Amlodipine and Ramipril daily . Joint swelling  . Nocturia  . Peripheral arterial disease (HCC)  . Pulmonary embolism (HCC)   Refer to VQ scan.  Past Surgical History: Past Surgical History: Procedure Laterality Date . BACK SURGERY   . colonosocpy   . ENDARTERECTOMY FEMORAL Right 02/25/2015  Procedure: ENDARTERECTOMYRight  FEMORAL Endarterectomy with profundaplasty.;  Surgeon: Sherren Kerns, MD;  Location: [facility] OR;  Service: Vascular;  Laterality: Right; . FEMORAL-POPLITEAL BYPASS GRAFT Right 02/25/2015  Procedure: BYPASS GRAFT FEMORAL-POPLITEAL ARTERY using non-reversed  saphenous vein.;  Surgeon: Sherren Kerns, MD;  Location: [facility] OR;  Service: Vascular;  Laterality: Right; . PERIPHERAL VASCULAR CATHETERIZATION N/A 01/24/2015  Procedure: Abdominal Aortogram;  Surgeon: Sherren Kerns, MD;  Location: [facility] INVASIVE CV LAB;  Service: Cardiovascular;  Laterality: N/A; HPI: 79 yo male adm to [facility] with FTT, pneunomediastinum, cavitary lung lesions, bilateral PE, now on heparin per MD notes.  Per notes, pt's daughter, wife and dog passed and concern for depression present.  Pt denies dysphagia.  Swallow evaluation ordered.  Subjective: pt awake in chair Assessment / Plan / Recommendation CHL IP CLINICAL IMPRESSIONS 08/07/2019 Clinical Impression Patient presents with sensormotor based oropharyngeal dysphagia.  Weak muscle contraction and decreased oral control results in poor oral bolus cohesion and pharyngeal retention/aspiration.  Lingual pumping observed across consistencies with delay - SLP counting 1, 2, 3 - did not appear to improve efficency.  Pt aspirated thin liquids due to decreased timing and adequacy of laryngeal closure.  Reflexive cough noted with aspiration but did not clear aspirates.  Chin tuck posture prevented aspiration but allowed laryngeal penetration of thin.  Pharyngeal retention noted across all consistencies due to weakness without adequate sensation/awareness.  Pt does intermittent propel vallecular residuals into oral cavity using tongue base and expectorate= admitting he has done this prior to admissoin.  SLP recommends to continue current diet with strict precautions and allow single tsps of thin with chin tuck (with staff only).  Pt's swallowing appears laborious for him and concern for nutritional adequacy present.  Using  teach back, educated pt to findings/recommendations.  Thankful palliative is following.  Marland Kitchen SLP Visit Diagnosis Dysphagia, oropharyngeal phase (R13.12) Attention and concentration deficit following -- Frontal lobe and executive function  deficit following -- Impact on safety and function Risk for inadequate nutrition/hydration;Moderate aspiration risk   CHL IP TREATMENT RECOMMENDATION 08/07/2019 Treatment Recommendations F/U MBS in --- days (Comment);Therapy as outlined in treatment plan below;Defer until completion of intrumental exam   Prognosis 08/07/2019 Prognosis for Safe Diet Advancement Guarded Barriers to Reach Goals Time post onset;Motivation Barriers/Prognosis Comment -- CHL IP DIET RECOMMENDATION 08/07/2019 SLP Diet Recommendations Dysphagia 3 (Mech soft) solids;Nectar thick liquid Liquid Administration via Cup;Straw Medication Administration Crushed with puree Compensations Slow rate;Small sips/bites Postural Changes Seated upright at 90 degrees;Remain semi-upright after after feeds/meals (Comment)   CHL IP OTHER RECOMMENDATIONS 08/07/2019 Recommended Consults -- Oral Care Recommendations Oral care QID Other Recommendations Have oral suction available   No flowsheet data found.  CHL IP FREQUENCY AND DURATION 08/07/2019 Speech Therapy Frequency (ACUTE ONLY) min 2x/week Treatment Duration 2 weeks      CHL IP ORAL PHASE 08/07/2019 Oral Phase Impaired Oral - Pudding Teaspoon -- Oral - Pudding Cup -- Oral - Honey Teaspoon -- Oral - Honey Cup -- Oral - Nectar Teaspoon Decreased bolus cohesion;Weak lingual manipulation;Delayed oral transit;Reduced posterior propulsion Oral - Nectar Cup Decreased bolus cohesion;Weak lingual manipulation;Delayed oral transit;Reduced posterior propulsion Oral - Nectar Straw Decreased bolus cohesion;Weak lingual manipulation;Delayed oral transit;Reduced posterior propulsion Oral - Thin Teaspoon Decreased bolus cohesion;Weak lingual manipulation;Reduced posterior propulsion;Premature spillage Oral - Thin Cup Decreased bolus cohesion;Weak lingual manipulation;Delayed oral transit;Reduced posterior propulsion;Premature spillage Oral - Thin Straw Decreased bolus cohesion;Weak lingual manipulation;Delayed oral transit;Reduced  posterior propulsion Oral - Puree Decreased bolus cohesion;Weak lingual manipulation;Delayed oral transit;Reduced posterior propulsion Oral - Mech Soft Decreased bolus cohesion;Weak lingual manipulation;Delayed oral transit;Reduced posterior propulsion Oral - Regular -- Oral - Multi-Consistency -- Oral - Pill -- Oral Phase - Comment --  CHL IP PHARYNGEAL PHASE 08/07/2019 Pharyngeal Phase Impaired Pharyngeal- Pudding Teaspoon -- Pharyngeal -- Pharyngeal- Pudding Cup -- Pharyngeal -- Pharyngeal- Honey Teaspoon -- Pharyngeal -- Pharyngeal- Honey Cup -- Pharyngeal -- Pharyngeal- Nectar Teaspoon Reduced epiglottic inversion;Reduced pharyngeal peristalsis;Reduced laryngeal elevation;Pharyngeal residue - pyriform;Pharyngeal residue - valleculae Pharyngeal Material does not enter airway Pharyngeal- Nectar Cup Pharyngeal residue - valleculae;Pharyngeal residue - pyriform;Reduced pharyngeal peristalsis;Reduced epiglottic inversion;Reduced anterior laryngeal mobility;Reduced laryngeal elevation;Reduced airway/laryngeal closure;Reduced tongue base retraction Pharyngeal Material does not enter airway Pharyngeal- Nectar Straw Pharyngeal residue - valleculae;Pharyngeal residue - pyriform;Reduced pharyngeal peristalsis;Reduced epiglottic inversion;Reduced anterior laryngeal mobility;Reduced laryngeal elevation;Reduced airway/laryngeal closure Pharyngeal Material does not enter airway Pharyngeal- Thin Teaspoon Penetration/Aspiration before swallow;Trace aspiration;Pharyngeal residue - valleculae;Pharyngeal residue - pyriform Pharyngeal Material enters airway, passes BELOW cords and not ejected out despite cough attempt by patient Pharyngeal- Thin Cup Delayed swallow initiation-pyriform sinuses;Penetration/Aspiration during swallow;Trace aspiration;Pharyngeal residue - valleculae;Pharyngeal residue - pyriform Pharyngeal Material enters airway, passes BELOW cords and not ejected out despite cough attempt by patient Pharyngeal- Thin  Straw Penetration/Aspiration during swallow;Pharyngeal residue - valleculae;Pharyngeal residue - pyriform;Reduced pharyngeal peristalsis;Reduced epiglottic inversion;Reduced anterior laryngeal mobility;Reduced laryngeal elevation;Reduced airway/laryngeal closure;Reduced tongue base retraction Pharyngeal Material enters airway, remains ABOVE vocal cords and not ejected out Pharyngeal- Puree Reduced pharyngeal peristalsis;Reduced tongue base retraction;Reduced laryngeal elevation;Reduced anterior laryngeal mobility;Reduced airway/laryngeal closure;Pharyngeal residue - valleculae Pharyngeal Material does not enter airway Pharyngeal- Mechanical Soft Reduced laryngeal elevation;Reduced anterior laryngeal mobility;Reduced epiglottic inversion;Reduced pharyngeal peristalsis;Reduced airway/laryngeal closure;Reduced tongue base retraction;Pharyngeal residue - valleculae Pharyngeal Material does not enter airway Pharyngeal- Regular -- Pharyngeal --  Pharyngeal- Multi-consistency -- Pharyngeal -- Pharyngeal- Pill -- Pharyngeal -- Pharyngeal Comment chin tuck prevent aspiration but allowed laryngeal penetration of thin and pt needed total cues to perform; head turn left (pt leaning toward left) did not decrease  pharyngeal retention; pt tilts head posterior to aid oral transiting of boluses; pt propels boluses from pharynx into oral cavity and expectorates or re=swallows- stating he has done this prior to admission; pt with reflexive cough with aspiration events but cough did not clear aspirates  CHL IP CERVICAL ESOPHAGEAL PHASE 08/07/2019 Cervical Esophageal Phase Impaired Pudding Teaspoon -- Pudding Cup -- Honey Teaspoon -- Honey Cup -- Nectar Teaspoon -- Nectar Cup -- Nectar Straw -- Thin Teaspoon -- Thin Cup -- Thin Straw -- Puree -- Mechanical Soft -- Regular -- Multi-consistency -- Pill -- Cervical Esophageal Comment Prominent cricopharyngeus apparent x1 with nectar liquid did not impair barium flow; At end of testing,pt's  esophagus appeared with retention distally without pt sensation Rolena Infante, MS [facility] SLP Acute Rehab Services Office [phone] Chales Abrahams 08/07/2019, 10:00 AM              ECHOCARDIOGRAM COMPLETE  Result Date: 08/07/2019    ECHOCARDIOGRAM REPORT   Patient Name:   Kevin Reilly Date of Exam: 08/06/2019 Medical Rec #:  [id number]     Height:       68.0 in Accession #:    [id number]    Weight:       115.5 lb Date of Birth:  February 15, 1941     BSA:          1.619 m Patient Age:    79 years      BP:           116/67 mmHg Patient Gender: M             HR:           81 bpm. Exam Location:  Inpatient Procedure: 2D Echo Indications:    Pulmonary Embolus 415.19 / I26.99  History:        Patient has prior history of Echocardiogram examinations, most                 recent 06/08/2019. CHF, CAD, PAD, Pulmonic Valve Disease; Risk                 Factors:Hypertension and Dyslipidemia. DVT, Bilateral carotid                 artery disease, Cardiomyopathy.  Sonographer:    Leta Jungling RDCS Referring Phys: [id number] VASUNDHRA RATHORE IMPRESSIONS  1. Left ventricular ejection fraction, by estimation, is 25 to 30%. The left ventricle has severely decreased function. The left ventricle demonstrates global hypokinesis. Left ventricular diastolic parameters are consistent with Grade I diastolic dysfunction (impaired relaxation).  2. Right ventricular systolic function is normal. The right ventricular size is normal.  3. Left atrial size was mildly dilated.  4. Right atrial size was mildly dilated.  5. The mitral valve is normal in structure. Trivial mitral valve regurgitation.  6. The aortic valve is tricuspid. Aortic valve regurgitation is trivial. No aortic stenosis is present. Mild aortic valve annular calcification. There is mild thickening of the aortic valve. There is moderate calcification of the aortic valve Comparison(s): No significant change from prior study. 06/08/2019. FINDINGS  Left Ventricle: Left ventricular ejection  fraction, by estimation, is 25 to 30%. The left ventricle has severely decreased function. The left ventricle demonstrates global hypokinesis. The left ventricular internal  cavity size was normal in size. There is no left ventricular hypertrophy. Left ventricular diastolic parameters are consistent with Grade I diastolic dysfunction (impaired relaxation). Normal left ventricular filling pressure. Right Ventricle: The right ventricular size is normal. No increase in right ventricular wall thickness. Right ventricular systolic function is normal. Left Atrium: Left atrial size was mildly dilated. Right Atrium: Right atrial size was mildly dilated. Pericardium: There is no evidence of pericardial effusion. Mitral Valve: The mitral valve is normal in structure. Trivial mitral valve regurgitation. Tricuspid Valve: The tricuspid valve is normal in structure. Tricuspid valve regurgitation is not demonstrated. No evidence of tricuspid stenosis. Aortic Valve: The aortic valve is tricuspid. . There is mild thickening and moderate calcification of the aortic valve. Aortic valve regurgitation is trivial. No aortic stenosis is present. Mild aortic valve annular calcification. There is mild thickening of the aortic valve. There is moderate calcification of the aortic valve. Pulmonic Valve: The pulmonic valve was grossly normal. Pulmonic valve regurgitation is trivial. Aorta: The aortic root is normal in size and structure. Venous: The inferior vena cava was not well visualized. IAS/Shunts: No atrial level shunt detected by color flow Doppler.  LEFT VENTRICLE PLAX 2D LVIDd:         5.50 cm      Diastology LVIDs:         4.50 cm      LV e' lateral:   4.22 cm/s LV PW:         0.80 cm      LV E/e' lateral: 12.2 LV IVS:        0.90 cm      LV e' medial:    5.27 cm/s LVOT diam:     2.00 cm      LV E/e' medial:  9.8 LV SV:         57 LV SV Index:   35 LVOT Area:     3.14 cm  LV Volumes (MOD) LV vol d, MOD A2C: 210.0 ml LV vol d, MOD  A4C: 160.0 ml LV vol s, MOD A2C: 136.0 ml LV vol s, MOD A4C: 124.0 ml LV SV MOD A2C:     74.0 ml LV SV MOD A4C:     160.0 ml LV SV MOD BP:      54.0 ml RIGHT VENTRICLE RV S prime:     12.30 cm/s TAPSE (M-mode): 2.2 cm LEFT ATRIUM             Index       RIGHT ATRIUM           Index LA diam:        3.60 cm 2.22 cm/m  RA Area:     14.80 cm LA Vol (A2C):   48.6 ml 30.03 ml/m RA Volume:   39.80 ml  24.59 ml/m LA Vol (A4C):   38.1 ml 23.54 ml/m LA Biplane Vol: 44.2 ml 27.31 ml/m  AORTIC VALVE LVOT Vmax:   108.00 cm/s LVOT Vmean:  66.000 cm/s LVOT VTI:    0.180 m  AORTA Ao Root diam: 3.30 cm MITRAL VALVE MV Area (PHT): 1.72 cm     SHUNTS MV Decel Time: 440 msec     Systemic VTI:  0.18 m MV E velocity: 51.40 cm/s   Systemic Diam: 2.00 cm MV A velocity: 113.00 cm/s MV E/A ratio:  0.45 Yates Decamp MD Electronically signed by Yates Decamp MD Signature Date/Time: 08/07/2019/3:00:09 PM    Final     Assessment/Plan  1. Advance care planning - remains to be DNR - discussed medications, vital signs and weights  2. Pneumomediastinum (HCC) - suspected to be trauma related with no pneumothorax - followed up palliative care  3. Pulmonary embolism without acute cor pulmonale, unspecified chronicity, unspecified pulmonary embolism type (HCC) - continue Eliquis - followed up with pulmonary on 08/14/19  4. Cavitary lesion of lung - completed IV Zosyn - for CT chest in 6 weeks  5. Chronic combined systolic and diastolic heart failure (HCC) - no SOB  - weigh daily  6. Urinary retention - continue foley catheter and follow up with urology on 09/05/19 for voiding     Family/ staff Communication:  Discussed plan of care with niece and IDT.  Labs/tests ordered:  None  Goals of care:   Short-term care   Kenard Gower, DNP, FNP-BC [facility] [phone] (Monday-Friday 8:00 a.m. - 5:00 p.m.) [phone] (after hours)

## 2019-08-17 ENCOUNTER — Encounter: Payer: Self-pay | Admitting: Adult Health

## 2019-08-17 ENCOUNTER — Other Ambulatory Visit: Payer: Self-pay

## 2019-08-17 ENCOUNTER — Non-Acute Institutional Stay: Payer: Medicare Other | Admitting: Hospice

## 2019-08-17 DIAGNOSIS — Z515 Encounter for palliative care: Secondary | ICD-10-CM

## 2019-08-17 NOTE — Progress Notes (Signed)
Therapist, nutritional Palliative Care Consult Note Telephone: [phone]  Fax: [phone]  PATIENT NAME: Kevin Reilly DOB: 12-14-1940 MRN: [id number]  PRIMARY CARE PROVIDER:   Dr. Marga Melnick REFERRING PROVIDER: Dr. Marga Melnick RESPONSIBLE PARTY:  Annamarie Dawley - niece [phone]    RECOMMENDATIONS/PLAN:   Advance Care Planning/Goals of Care: Visit at the request of Dr. Marga Melnick for palliative consult. Visit consisted of building trust and discussions on Palliative Medicine as specialized medical care for people living with serious illness, aimed at facilitating better quality of life through symptoms relief, assisting with advance care plan and establishing goals of care.  Patient is a DO NOT RESUSCITATE.  DNR form in facility chart; same form uploaded to epic today.  NP spoke with Victorino Dike about palliative services, of which she expressed appreciation. She shared that  patient has a combination of complex family dynamics that will make it difficult for him to go back home and stay by himself.  She also mentioned that she has discussed with [facility] to call DSS and see how patients can become a ward of the state.  Therapeutic listening and validation provided.  Victorino Dike mentioned that if it becomes necessary for patients to go into hospice service, she will be open to it even if it means patient staying in her place.  She was assured of a smooth transition to hospice services in the future when patient qualifies for it. Symptom management: Patient was hospitalized 4/23-4/28/2021 for pneumomediastinum and pulmonary embolism.  After treatment, he was discharged to [facility] For short-term rehab.patient.  He is continuing with doxycycline 100mg  BID and Floraster 250mg  BID till 08/24/2019 and 5/17//2021 respectively.  Patient is also on Eliquis 5 mg twice daily.  Patient was seen on 08/15/2019 by cardiologist; he is currently on carvedilol 3.125 mg tablet 2 times  daily with a meal.  Hold if systolic blood pressure less than 100 and heart rate less than 60.  Chart review indicates patient saw his pulmonologist 08/14/2019, follow-up for pulmonary embolism. He continues on Eliquis; with recommendation for a CT chest In 6 weeks. He has a Foley size 16, patent, light yellow urine in bag.  Seen resting in bed after his PT exercise, he acknowledged being exhausted from the activity.  Education provided on the need to eat food and drink his fluids when presented to him.  He verbalized understanding and said he will try.  Nursing staff reported patient underwent a swallow study by ST; he passed the test; ST following. Patient with poor appetite.  He is on pured diet.  He denied shortness of breath, coughing, pain/discomfort.  PT reported patient ambulating up to 40 feet with rest periods in between,  Standby assist only.  Patient in no medical acuity, nursing staff with no concerns at this time.  Encouraged ongoing physical therapy and nursing care.  Recommendation: Patient could benefit from mirtazapine 7.5 mg daily to boost his appetite. Follow up: Palliative care will continue to follow patient for goals of care clarification and symptom management. I spent 1 hour and 26 minutes providing this consultation; time includes chart review and documentation. More than 50% of the time in this consultation was spent on coordinating communication  HISTORY OF PRESENT ILLNESS:  Kevin Reilly is a 79 y.o. year old male with multiple medical problems including recent pneumomediastinum, pulmonary embolism, acute metabolic encephalopathy, DVT, CHF.  Palliative Care was asked to help address goals of care.   CODE STATUS: DNR  PPS:  40% HOSPICE ELIGIBILITY/DIAGNOSIS: TBD  PAST MEDICAL HISTORY:  Past Medical History:  Diagnosis Date  . Bilateral carotid artery disease ([place])   . Cardiomyopathy ([place]) 05/2019  . CHF (congestive heart failure) ([place])   . Critical lower limb ischemia   .  DVT (deep venous thrombosis) ([place])   . Dyspnea on exertion 05/30/2019  . Hyperlipidemia    takes Atorvastatin daily  . Hypertension    takes Amlodipine and Ramipril daily  . Joint swelling   . Nocturia   . Peripheral arterial disease ([name])   . Pulmonary embolism ([place])    Refer to VQ scan.     SOCIAL HX:  Social History   Tobacco Use  . Smoking status: Former Smoker    Types: Cigarettes  . Smokeless tobacco: Never Used  Substance Use Topics  . Alcohol use: No    Alcohol/week: 0.0 standard drinks    ALLERGIES:  Allergies  Allergen Reactions  . Chocolate Hives  . Sulfur Rash     PERTINENT MEDICATIONS:  Outpatient Encounter Medications as of 08/17/2019  Medication Sig  . acetaminophen (TYLENOL) 325 MG tablet Take 325 mg by mouth every 6 (six) hours as needed.  Marland Kitchen albuterol (VENTOLIN HFA) 108 (90 Base) MCG/ACT inhaler Inhale 2 puffs into the lungs every 4 (four) hours as needed for wheezing or shortness of breath.   Marland Kitchen apixaban (ELIQUIS) 5 MG TABS tablet Take 5 mg by mouth 2 (two) times daily.  Marland Kitchen aspirin 81 MG chewable tablet Chew 81 mg by mouth daily.   Marland Kitchen atorvastatin (LIPITOR) 20 MG tablet Take 1 tablet (20 mg total) by mouth daily.  Marland Kitchen doxycycline (VIBRAMYCIN) 100 MG capsule Take 100 mg by mouth 2 (two) times daily.  . ferrous sulfate 325 (65 FE) MG tablet Take 1 tablet (325 mg total) by mouth daily with breakfast.  . folic acid (FOLVITE) 1 MG tablet Take 1 tablet (1 mg total) by mouth daily.  . Nutritional Supplement LIQD Take 120 mLs by mouth 3 (three) times daily between meals. Med[employer]  . Saccharomyces boulardii (FLORASTORMAX PO) Take by mouth.   No facility-administered encounter medications on file as of 08/17/2019.    PHYSICAL EXAM/ROS:  General: NAD, frail appearing, thin Cardiovascular: regular rate and rhythm; denies chest pain Pulmonary: clear ant/post fields; normal respiratory effort Abdomen: soft, nontender, + bowel sounds GU: Foley size 16, patent, light  yellow urine in Foley bag Extremities: no edema, discolored bilateral shin, hair loss Skin: no rashes to exposed skin Neurological: Weakness but otherwise nonfocal; no confusion during visit.  Teodoro Spray, NP

## 2019-08-22 LAB — COMPREHENSIVE METABOLIC PANEL
Calcium: 9.4 (ref 8.7–10.7)
GFR calc Af Amer: 80.1
GFR calc non Af Amer: 69.11

## 2019-08-22 LAB — BASIC METABOLIC PANEL
BUN: 39 — AB (ref 4–21)
CO2: 26 — AB (ref 13–22)
Chloride: 103 (ref 99–108)
Creatinine: 1 (ref 0.6–1.3)
Glucose: 87
Potassium: 3.4 (ref 3.4–5.3)
Sodium: 142 (ref 137–147)

## 2019-08-22 LAB — CBC AND DIFFERENTIAL
HCT: 30 — AB (ref 41–53)
Hemoglobin: 9.7 — AB (ref 13.5–17.5)
Neutrophils Absolute: 5
Platelets: 358 (ref 150–399)
WBC: 6.9

## 2019-08-22 LAB — CBC: RBC: 3.41 — AB (ref 3.87–5.11)

## 2019-08-23 ENCOUNTER — Encounter: Payer: Self-pay | Admitting: Adult Health

## 2019-08-23 ENCOUNTER — Non-Acute Institutional Stay (SKILLED_NURSING_FACILITY): Payer: Medicare Other | Admitting: Adult Health

## 2019-08-23 DIAGNOSIS — D508 Other iron deficiency anemias: Secondary | ICD-10-CM

## 2019-08-23 DIAGNOSIS — R339 Retention of urine, unspecified: Secondary | ICD-10-CM

## 2019-08-23 DIAGNOSIS — J189 Pneumonia, unspecified organism: Secondary | ICD-10-CM

## 2019-08-23 DIAGNOSIS — I5041 Acute combined systolic (congestive) and diastolic (congestive) heart failure: Secondary | ICD-10-CM

## 2019-08-23 DIAGNOSIS — I2699 Other pulmonary embolism without acute cor pulmonale: Secondary | ICD-10-CM | POA: Diagnosis not present

## 2019-08-23 NOTE — Progress Notes (Signed)
Location:  [facility] Room Number: 318-A Place of Service:  SNF (31) Provider:  Kenard Gower, DNP, FNP-BC  Patient Care Team: Patient, No Pcp Per as PCP - General (General Practice) Patient, No Pcp Per (General Practice)  Extended Emergency Contact Information Primary Emergency Contact: Hippert,Jennifer  [place] Home Phone: [phone] Mobile Phone: [phone] Relation: Niece Secondary Emergency Contact: Ewing, Fandino Mobile Phone: [phone] Relation: Brother  Code Status:  DNR  Goals of care: Advanced Directive information Advanced Directives 08/17/2019  Does Patient Have a Medical Advance Directive? Yes  Type of Advance Directive Out of facility DNR (pink MOST or yellow form)  Does patient want to make changes to medical advance directive? No - Patient declined  Copy of Healthcare Power of Attorney in Chart? -  Would patient like information on creating a medical advance directive? -  Pre-existing out of facility DNR order (yellow form or pink MOST form) Pink MOST form placed in chart (order not valid for inpatient use)  Some encounter information is confidential and restricted. Go to Review Flowsheets activity to see all data.     Chief Complaint  Patient presents with  . Medical Management of Chronic Issues    Routine [facility] visit    HPI:  Pt is a 79 y.o. male seen today for medical management of chronic diseases.  He is a short-term care resident of [facility]. He has a PMH of PAD, essential hypertension, dyslipidemia, history of DVT and PE on Eliquis and cardiomyopathy with CHF and CAD. He was treated for cavitary lesion with his recent hospitalization. He was treated with Zosyn. He followed up with pulmonary on 08/14/19 and a follow up chest x-ray was ordered. No reported fever nor SOB. Repeat chest x-ray showed left lung infiltrate. He was started on Doxycycline.   Past  Medical History:  Diagnosis Date  . Bilateral carotid artery disease (HCC)   . Cardiomyopathy (HCC) 05/2019  . CHF (congestive heart failure) (HCC)   . Critical lower limb ischemia   . DVT (deep venous thrombosis) (HCC)   . Dyspnea on exertion 05/30/2019  . Hyperlipidemia    takes Atorvastatin daily  . Hypertension    takes Amlodipine and Ramipril daily  . Joint swelling   . Nocturia   . Peripheral arterial disease (HCC)   . Pulmonary embolism (HCC)    Refer to VQ scan.    Past Surgical History:  Procedure Laterality Date  . BACK SURGERY    . colonosocpy    . ENDARTERECTOMY FEMORAL Right 02/25/2015   Procedure: ENDARTERECTOMYRight  FEMORAL Endarterectomy with profundaplasty.;  Surgeon: Sherren Kerns, MD;  Location: [facility] OR;  Service: Vascular;  Laterality: Right;  . FEMORAL-POPLITEAL BYPASS GRAFT Right 02/25/2015   Procedure: BYPASS GRAFT FEMORAL-POPLITEAL ARTERY using non-reversed saphenous vein.;  Surgeon: Sherren Kerns, MD;  Location: [facility] OR;  Service: Vascular;  Laterality: Right;  . PERIPHERAL VASCULAR CATHETERIZATION N/A 01/24/2015   Procedure: Abdominal Aortogram;  Surgeon: Sherren Kerns, MD;  Location: [facility] INVASIVE CV LAB;  Service: Cardiovascular;  Laterality: N/A;    Allergies  Allergen Reactions  . Chocolate Hives  . Sulfur Rash    Outpatient Encounter Medications as of 08/23/2019  Medication Sig  . acetaminophen (TYLENOL) 325 MG tablet Take 650 mg by mouth every 6 (six) hours as needed.   Marland Kitchen albuterol (VENTOLIN HFA) 108 (90 Base) MCG/ACT inhaler Inhale 2 puffs into the lungs every 4 (four) hours as needed for wheezing  or shortness of breath.   Marland Kitchen apixaban (ELIQUIS) 5 MG TABS tablet Take 5 mg by mouth 2 (two) times daily.  Marland Kitchen aspirin 81 MG chewable tablet Chew 81 mg by mouth daily.   Marland Kitchen atorvastatin (LIPITOR) 20 MG tablet Take 1 tablet (20 mg total) by mouth daily.  . carvedilol (COREG) 3.125 MG tablet Take 3.125 mg by mouth 2 (two) times daily with a meal.  .  doxycycline (VIBRAMYCIN) 100 MG capsule Take 100 mg by mouth 2 (two) times daily.  . ferrous sulfate 325 (65 FE) MG tablet Take 1 tablet (325 mg total) by mouth daily with breakfast.  . folic acid (FOLVITE) 1 MG tablet Take 1 tablet (1 mg total) by mouth daily.  . Nutritional Supplement LIQD Take 120 mLs by mouth 3 (three) times daily between meals. MedPass  . Saccharomyces boulardii (FLORASTORMAX PO) Take 250 mg by mouth in the morning and at bedtime.    No facility-administered encounter medications on file as of 08/23/2019.    Review of Systems  Unable to obtain due to being nonverbal   There is no immunization history on file for this patient. Pertinent  Health Maintenance Due  Topic Date Due  . PNA vac Low Risk Adult (1 of 2 - PCV13) Never done  . INFLUENZA VACCINE  11/11/2019    Vitals:   08/23/19 1247  BP: 133/73  Pulse: 92  Resp: 19  Temp: 97.7 F (36.5 C)  TempSrc: Oral  Weight: 111 lb 12.8 oz (50.7 kg)  Height:  (1.727 m)   Body mass index is 17 kg/m.  Physical Exam  GENERAL APPEARANCE:  In no acute distress. Cachectic. SKIN:  Skin is warm and dry.  MOUTH and THROAT: Lips are without lesions. Oral mucosa is moist and without lesions.  RESPIRATORY: Breathing is even & unlabored, BS CTAB CARDIAC: RRR, no murmur,no extra heart sounds, no edema GI: Abdomen soft, normal BS, no masses, no tenderness GU:  Has foley catheter draining to urine bag NEUROLOGICAL: There is no tremor. Nonverbal. PSYCHIATRIC:  Affect and behavior are appropriate  Labs reviewed: Recent Labs    06/21/19 1426 07/09/19 1350 08/04/19 0322 08/05/19 0245 08/06/19 0035 08/06/19 0035 08/07/19 0218 08/07/19 0218 08/08/19 0815 08/15/19 0000 08/22/19 0000  NA 134   < >  --    < > 141   < > 141   < > 142 141 142  K 4.8   < >  --    < > 3.7   < > 3.3*   < > 3.8 4.0 3.4  CL 97   < >  --    < > 102   < > 102   < > 102 102 103  CO2 23   < >  --    < > 31   < > 30   < > 29 23* 26*    GLUCOSE 110*   < >  --    < > 127*  --  94  --  100*  --   --   BUN 38*   < >  --    < > 59*   < > 31*   < > 18 32* 39*  CREATININE 1.49*   < >  --    < > 1.78*   < > 1.39*   < > 1.48* 1.7* 1.0  CALCIUM 9.1   < >  --    < > 8.3*   < > 8.7*   < >  9.0 9.2 9.4  MG 1.6  --  2.3  --  1.8  --   --   --   --   --   --   PHOS  --   --   --   --  2.8  --   --   --   --   --   --    < > = values in this interval not displayed.   Recent Labs    06/05/19 1528 07/09/19 1350 08/03/19 2223  AST 87* 42* 20  ALT 65* 37 19  ALKPHOS 110 113 63  BILITOT 0.8 1.1 0.5  PROT 6.7 7.2 4.5*  ALBUMIN 3.0* 2.4* 1.1*   Recent Labs    08/03/19 2223 08/03/19 2314 08/05/19 0245 08/05/19 0245 08/06/19 0035 08/06/19 0035 08/07/19 0218 08/15/19 0000 08/22/19 0000  WBC 14.0*   < > 11.1*   < > 9.3   < > 11.5* 12.6 6.9  NEUTROABS 12.8*  --   --   --   --   --   --  10 5  HGB 8.5*   < > 9.3*   < > 8.7*   < > 9.5* 10.7* 9.7*  HCT 29.2*   < > 30.9*   < > 28.7*   < > 31.2* 34* 30*  MCV 96.7   < > 91.7  --  92.6  --  92.0  --   --   PLT 429*   < > 386   < > 354   < > 332 392 358   < > = values in this interval not displayed.    Lab Results  Component Value Date   CHOL 174 02/10/2015   HDL 42 02/10/2015   LDLCALC 105 02/10/2015   TRIG 137 02/10/2015   CHOLHDL 4.1 02/10/2015    Significant Diagnostic Results in last 30 days:  CT Head Wo Contrast  Result Date: 08/04/2019 CLINICAL DATA:  Lying on the floor. EXAM: CT HEAD WITHOUT CONTRAST TECHNIQUE: Contiguous axial images were obtained from the base of the skull through the vertex without intravenous contrast. COMPARISON:  None. FINDINGS: Brain: There is mild cerebral atrophy with widening of the extra-axial spaces and ventricular dilatation. There are areas of decreased attenuation within the white matter tracts of the supratentorial brain, consistent with microvascular disease changes. Vascular: No hyperdense vessel or unexpected calcification. Skull:  Normal. Negative for fracture or focal lesion. Sinuses/Orbits: No acute finding. Other: None. IMPRESSION: 1. Generalized cerebral atrophy. 2. No acute intracranial abnormality. Electronically Signed   By: Aram Candela M.D.   On: 08/04/2019 02:11   CT Chest W Contrast  Result Date: 08/04/2019 CLINICAL DATA:  Found on the floor. EXAM: CT CHEST WITH CONTRAST TECHNIQUE: Multidetector CT imaging of the chest was performed during intravenous contrast administration. CONTRAST:  60mL OMNIPAQUE IOHEXOL 300 MG/ML  SOLN COMPARISON:  None. FINDINGS: Cardiovascular: A moderate amount of intraluminal low attenuation is seen involving multiple lower lobe branches of the right pulmonary artery (axial CT images 92 through 99, CT series number 4). A mild amount of intraluminal low attenuation is also seen involving several upper lobe branches of the left pulmonary artery (axial CT images 82 through 86, CT series number 4). There is marked severity calcification of the thoracic aorta without evidence of aneurysmal dilatation or dissection. Normal heart size. No pericardial effusion. Marked severity coronary artery calcification is seen. Mediastinum/Nodes: A large amount of air is seen throughout the mediastinum. This is seen at  the level of the lung bases and extends superiorly to involve the soft tissues posterior and lateral to the oropharynx. Lungs/Pleura: There is moderate to marked severity emphysematous lung disease. A 9.2 cm x 4.4 cm x 4.2 cm lobulated, thick walled cavitary lesion is seen along the posterolateral aspect of the left upper lobe. A 2.9 cm x 1.1 cm x 1.4 cm thin walled cavitary lesion is seen along the posterolateral aspect of the right middle lobe. A 9 mm noncalcified lung nodule is seen within the posterior aspect of the right lower lobe (axial CT image 93, CT series number 5). Upper Abdomen: A small amount of air density is seen posterior to the infrarenal abdominal aorta (axial CT images 153 through  157, CT series number 4), likely consistent with extension of the previously noted pneumomediastinum. Musculoskeletal: Multilevel degenerative changes seen throughout the thoracic spine. IMPRESSION: 1. Large amount of pneumomediastinum which extends superiorly to involve the soft tissues posterior and lateral to the oropharynx. 2. Mild to moderate severity bilateral pulmonary embolism, right greater than left. 3. 9.2 cm x 4.4 cm x 4.2 cm lobulated, thick walled left upper lobe cavitary lesion. This may represent a large cavitary pneumonia, however, a cavitary neoplasm cannot be excluded. 4. Additional smaller right middle lobe cavitary lesion with a 9 mm noncalcified right lower lobe lung nodule. Attention on follow-up is recommended. 5. Moderate to marked severity emphysematous lung disease. Aortic Atherosclerosis (ICD10-I70.0) and Emphysema (ICD10-J43.9). Electronically Signed   By: Aram Candela M.D.   On: 08/04/2019 02:40   CT Cervical Spine Wo Contrast  Result Date: 08/04/2019 CLINICAL DATA:  Found on the floor. EXAM: CT CERVICAL SPINE WITHOUT CONTRAST TECHNIQUE: Multidetector CT imaging of the cervical spine was performed without intravenous contrast. Multiplanar CT image reconstructions were also generated. COMPARISON:  July 09, 2019 FINDINGS: Alignment: There is approximately 2 mm anterolisthesis of the C4 on C5 vertebral body, with approximately 3 mm anterolisthesis of the C5 on C6 vertebral body. 3 mm anterolisthesis of C7 on T1 is also noted. Skull base and vertebrae: No acute fracture. No primary bone lesion or focal pathologic process. Soft tissues and spinal canal: No prevertebral fluid or swelling. No visible canal hematoma. Disc levels: Moderate severity endplate sclerosis is seen at the levels of C4-C5, C5-C6 and C6-C7. Moderate to marked severity intervertebral disc space narrowing is also seen at these levels. Marked severity posterior intervertebral disc space narrowing is seen at the  level of C3-C4. There is marked severity bilateral multilevel facet joint hypertrophy. Upper chest: A marked amount of air is seen within the visualized portion of the superior mediastinum. This extends superiorly to involve the soft tissues along the posterior and posterolateral aspect of the a moderate amount of soft tissue air is also seen along the supraclavicular region on the left. Oropharynx. Other: None. IMPRESSION: 1. Marked severity superior pneumomediastinum which extends superiorly to involve the soft tissues along the posterior and posterolateral aspect of the left supraclavicular region on the left. Further evaluation with a chest CT is recommended. 2. Marked severity multilevel degenerative changes, most prominent at the levels of C4-C5, C5-C6 and C6-C7. 3. 2 mm anterolisthesis of the C4 on C5 vertebral body, C5-C6 vertebral body, and C7-T1 vertebral bodies. Electronically Signed   By: Aram Candela M.D.   On: 08/04/2019 02:19   DG Chest Port 1 View  Result Date: 08/03/2019 CLINICAL DATA:  Altered mental status. EXAM: PORTABLE CHEST 1 VIEW COMPARISON:  Chest radiograph 07/09/2019. No prior chest CT.  FINDINGS: Persistent airspace disease in the periphery of the left mid lung, now with air-filled component concerning for cavitation. Patchy opacities in the right lung have improved from prior exam. Normal heart size with unchanged mediastinal contours. Short lucency outlining the aortic arch was not seen on prior. Vertically oriented lucency projecting over the central left lung base. There is air in the left supraclavicular soft tissues. Possible tiny left pneumothorax. No significant pleural fluid. Underlying emphysema. No acute rib fractures are visualized. IMPRESSION: 1. Persistent airspace disease in the periphery of the left mid lung, now with air-filled component concerning for cavitation. Bilateral patchy lung opacities have otherwise improved from radiograph last month. 2. Multiple  lucencies in the left hemithorax are subtle but suspicious for pneumothorax, including adjacent to the aortic arch, medial left lung base, and possibly left apex. 3. Soft tissue air in the left supraclavicular soft tissues. 4. Recommend further characterization with chest CT. These results were called by telephone at the time of interpretation on 08/03/2019 at 10:46 pm to PA [facility] , who verbally acknowledged these results. Electronically Signed   By: Narda Rutherford M.D.   On: 08/03/2019 22:47   DG Swallowing Func-Speech Pathology  Result Date: 08/07/2019 Objective Swallowing Evaluation: Type of Study: Bedside Swallow Evaluation  Patient Details Name: TIMMEY LAMBA MRN: [id number] Date of Birth: [birth year] Today's Date: 08/07/2019 Time: SLP Start Time (ACUTE ONLY): 0850 -SLP Stop Time (ACUTE ONLY): 0919 SLP Time Calculation (min) (ACUTE ONLY): 29 min Past Medical History: Past Medical History: Diagnosis Date . Bilateral carotid artery disease (HCC)  . Cardiomyopathy (HCC) 05/2019 . CHF (congestive heart failure) (HCC)  . Critical lower limb ischemia  . DVT (deep venous thrombosis) (HCC)  . Dyspnea on exertion 05/30/2019 . Hyperlipidemia   takes Atorvastatin daily . Hypertension   takes Amlodipine and Ramipril daily . Joint swelling  . Nocturia  . Peripheral arterial disease (HCC)  . Pulmonary embolism (HCC)   Refer to VQ scan.  Past Surgical History: Past Surgical History: Procedure Laterality Date . BACK SURGERY   . colonosocpy   . ENDARTERECTOMY FEMORAL Right 02/25/2015  Procedure: ENDARTERECTOMYRight  FEMORAL Endarterectomy with profundaplasty.;  Surgeon: Sherren Kerns, MD;  Location: [facility] OR;  Service: Vascular;  Laterality: Right; . FEMORAL-POPLITEAL BYPASS GRAFT Right 02/25/2015  Procedure: BYPASS GRAFT FEMORAL-POPLITEAL ARTERY using non-reversed saphenous vein.;  Surgeon: Sherren Kerns, MD;  Location: [facility] OR;  Service: Vascular;  Laterality: Right; . PERIPHERAL VASCULAR CATHETERIZATION N/A  01/24/2015  Procedure: Abdominal Aortogram;  Surgeon: Sherren Kerns, MD;  Location[facility] INVASIVE CV LAB;  Service: Cardiovascular;  Laterality: N/A; HPI: 79 yo male adm to [facility] with FTT, pneunomediastinum, cavitary lung lesions, bilateral PE, now on heparin per MD notes.  Per notes, pt's daughter, wife and dog passed and concern for depression present.  Pt denies dysphagia.  Swallow evaluation ordered.  Subjective: pt awake in chair Assessment / Plan / Recommendation CHL IP CLINICAL IMPRESSIONS 08/07/2019 Clinical Impression Patient presents with sensormotor based oropharyngeal dysphagia.  Weak muscle contraction and decreased oral control results in poor oral bolus cohesion and pharyngeal retention/aspiration.  Lingual pumping observed across consistencies with delay - SLP counting 1, 2, 3 - did not appear to improve efficency.  Pt aspirated thin liquids due to decreased timing and adequacy of laryngeal closure.  Reflexive cough noted with aspiration but did not clear aspirates.  Chin tuck posture prevented aspiration but allowed laryngeal penetration of thin.  Pharyngeal retention noted across all consistencies due to weakness  without adequate sensation/awareness.  Pt does intermittent propel vallecular residuals into oral cavity using tongue base and expectorate= admitting he has done this prior to admissoin.  SLP recommends to continue current diet with strict precautions and allow single tsps of thin with chin tuck (with staff only).  Pt's swallowing appears laborious for him and concern for nutritional adequacy present.  Using teach back, educated pt to findings/recommendations.  Thankful palliative is following.  Marland Kitchen SLP Visit Diagnosis Dysphagia, oropharyngeal phase (R13.12) Attention and concentration deficit following -- Frontal lobe and executive function deficit following -- Impact on safety and function Risk for inadequate nutrition/hydration;Moderate aspiration risk   CHL IP TREATMENT RECOMMENDATION  08/07/2019 Treatment Recommendations F/U MBS in --- days (Comment);Therapy as outlined in treatment plan below;Defer until completion of intrumental exam   Prognosis 08/07/2019 Prognosis for Safe Diet Advancement Guarded Barriers to Reach Goals Time post onset;Motivation Barriers/Prognosis Comment -- CHL IP DIET RECOMMENDATION 08/07/2019 SLP Diet Recommendations Dysphagia 3 (Mech soft) solids;Nectar thick liquid Liquid Administration via Cup;Straw Medication Administration Crushed with puree Compensations Slow rate;Small sips/bites Postural Changes Seated upright at 90 degrees;Remain semi-upright after after feeds/meals (Comment)   CHL IP OTHER RECOMMENDATIONS 08/07/2019 Recommended Consults -- Oral Care Recommendations Oral care QID Other Recommendations Have oral suction available   No flowsheet data found.  CHL IP FREQUENCY AND DURATION 08/07/2019 Speech Therapy Frequency (ACUTE ONLY) min 2x/week Treatment Duration 2 weeks      CHL IP ORAL PHASE 08/07/2019 Oral Phase Impaired Oral - Pudding Teaspoon -- Oral - Pudding Cup -- Oral - Honey Teaspoon -- Oral - Honey Cup -- Oral - Nectar Teaspoon Decreased bolus cohesion;Weak lingual manipulation;Delayed oral transit;Reduced posterior propulsion Oral - Nectar Cup Decreased bolus cohesion;Weak lingual manipulation;Delayed oral transit;Reduced posterior propulsion Oral - Nectar Straw Decreased bolus cohesion;Weak lingual manipulation;Delayed oral transit;Reduced posterior propulsion Oral - Thin Teaspoon Decreased bolus cohesion;Weak lingual manipulation;Reduced posterior propulsion;Premature spillage Oral - Thin Cup Decreased bolus cohesion;Weak lingual manipulation;Delayed oral transit;Reduced posterior propulsion;Premature spillage Oral - Thin Straw Decreased bolus cohesion;Weak lingual manipulation;Delayed oral transit;Reduced posterior propulsion Oral - Puree Decreased bolus cohesion;Weak lingual manipulation;Delayed oral transit;Reduced posterior propulsion Oral - Mech  Soft Decreased bolus cohesion;Weak lingual manipulation;Delayed oral transit;Reduced posterior propulsion Oral - Regular -- Oral - Multi-Consistency -- Oral - Pill -- Oral Phase - Comment --  CHL IP PHARYNGEAL PHASE 08/07/2019 Pharyngeal Phase Impaired Pharyngeal- Pudding Teaspoon -- Pharyngeal -- Pharyngeal- Pudding Cup -- Pharyngeal -- Pharyngeal- Honey Teaspoon -- Pharyngeal -- Pharyngeal- Honey Cup -- Pharyngeal -- Pharyngeal- Nectar Teaspoon Reduced epiglottic inversion;Reduced pharyngeal peristalsis;Reduced laryngeal elevation;Pharyngeal residue - pyriform;Pharyngeal residue - valleculae Pharyngeal Material does not enter airway Pharyngeal- Nectar Cup Pharyngeal residue - valleculae;Pharyngeal residue - pyriform;Reduced pharyngeal peristalsis;Reduced epiglottic inversion;Reduced anterior laryngeal mobility;Reduced laryngeal elevation;Reduced airway/laryngeal closure;Reduced tongue base retraction Pharyngeal Material does not enter airway Pharyngeal- Nectar Straw Pharyngeal residue - valleculae;Pharyngeal residue - pyriform;Reduced pharyngeal peristalsis;Reduced epiglottic inversion;Reduced anterior laryngeal mobility;Reduced laryngeal elevation;Reduced airway/laryngeal closure Pharyngeal Material does not enter airway Pharyngeal- Thin Teaspoon Penetration/Aspiration before swallow;Trace aspiration;Pharyngeal residue - valleculae;Pharyngeal residue - pyriform Pharyngeal Material enters airway, passes BELOW cords and not ejected out despite cough attempt by patient Pharyngeal- Thin Cup Delayed swallow initiation-pyriform sinuses;Penetration/Aspiration during swallow;Trace aspiration;Pharyngeal residue - valleculae;Pharyngeal residue - pyriform Pharyngeal Material enters airway, passes BELOW cords and not ejected out despite cough attempt by patient Pharyngeal- Thin Straw Penetration/Aspiration during swallow;Pharyngeal residue - valleculae;Pharyngeal residue - pyriform;Reduced pharyngeal peristalsis;Reduced  epiglottic inversion;Reduced anterior laryngeal mobility;Reduced laryngeal elevation;Reduced airway/laryngeal closure;Reduced tongue base retraction Pharyngeal Material enters airway, remains  ABOVE vocal cords and not ejected out Pharyngeal- Puree Reduced pharyngeal peristalsis;Reduced tongue base retraction;Reduced laryngeal elevation;Reduced anterior laryngeal mobility;Reduced airway/laryngeal closure;Pharyngeal residue - valleculae Pharyngeal Material does not enter airway Pharyngeal- Mechanical Soft Reduced laryngeal elevation;Reduced anterior laryngeal mobility;Reduced epiglottic inversion;Reduced pharyngeal peristalsis;Reduced airway/laryngeal closure;Reduced tongue base retraction;Pharyngeal residue - valleculae Pharyngeal Material does not enter airway Pharyngeal- Regular -- Pharyngeal -- Pharyngeal- Multi-consistency -- Pharyngeal -- Pharyngeal- Pill -- Pharyngeal -- Pharyngeal Comment chin tuck prevent aspiration but allowed laryngeal penetration of thin and pt needed total cues to perform; head turn left (pt leaning toward left) did not decrease  pharyngeal retention; pt tilts head posterior to aid oral transiting of boluses; pt propels boluses from pharynx into oral cavity and expectorates or re=swallows- stating he has done this prior to admission; pt with reflexive cough with aspiration events but cough did not clear aspirates  CHL IP CERVICAL ESOPHAGEAL PHASE 08/07/2019 Cervical Esophageal Phase Impaired Pudding Teaspoon -- Pudding Cup -- Honey Teaspoon -- Honey Cup -- Nectar Teaspoon -- Nectar Cup -- Nectar Straw -- Thin Teaspoon -- Thin Cup -- Thin Straw -- Puree -- Mechanical Soft -- Regular -- Multi-consistency -- Pill -- Cervical Esophageal Comment Prominent cricopharyngeus apparent x1 with nectar liquid did not impair barium flow; At end of testing,pt's esophagus appeared with retention distally without pt sensation Rolena Infante, MS [facility] SLP Acute Rehab Services Office [phone] Chales Abrahams  08/07/2019, 10:00 AM              ECHOCARDIOGRAM COMPLETE  Result Date: 08/07/2019    ECHOCARDIOGRAM REPORT   Patient Name:   VERLAN GROTZ Koopmann Date of Exam: 08/06/2019 Medical Rec #:  [id number]     Height:       68.0 in Accession #:    [id number]    Weight:       115.5 lb Date of Birth:  [birth year]     BSA:          1.619 m Patient Age:    79 years      BP:           116/67 mmHg Patient Gender: M             HR:           81 bpm. Exam Location:  Inpatient Procedure: 2D Echo Indications:    Pulmonary Embolus 415.19 / I26.99  History:        Patient has prior history of Echocardiogram examinations, most                 recent 06/08/2019. CHF, CAD, PAD, Pulmonic Valve Disease; Risk                 Factors:Hypertension and Dyslipidemia. DVT, Bilateral carotid                 artery disease, Cardiomyopathy.  Sonographer:    Leta Jungling RDCS Referring Phys: [id number] VASUNDHRA RATHORE IMPRESSIONS  1. Left ventricular ejection fraction, by estimation, is 25 to 30%. The left ventricle has severely decreased function. The left ventricle demonstrates global hypokinesis. Left ventricular diastolic parameters are consistent with Grade I diastolic dysfunction (impaired relaxation).  2. Right ventricular systolic function is normal. The right ventricular size is normal.  3. Left atrial size was mildly dilated.  4. Right atrial size was mildly dilated.  5. The mitral valve is normal in structure. Trivial mitral valve regurgitation.  6. The aortic valve is tricuspid. Aortic valve regurgitation is trivial. No aortic stenosis  is present. Mild aortic valve annular calcification. There is mild thickening of the aortic valve. There is moderate calcification of the aortic valve Comparison(s): No significant change from prior study. 06/08/2019. FINDINGS  Left Ventricle: Left ventricular ejection fraction, by estimation, is 25 to 30%. The left ventricle has severely decreased function. The left ventricle demonstrates global hypokinesis. The  left ventricular internal cavity size was normal in size. There is no left ventricular hypertrophy. Left ventricular diastolic parameters are consistent with Grade I diastolic dysfunction (impaired relaxation). Normal left ventricular filling pressure. Right Ventricle: The right ventricular size is normal. No increase in right ventricular wall thickness. Right ventricular systolic function is normal. Left Atrium: Left atrial size was mildly dilated. Right Atrium: Right atrial size was mildly dilated. Pericardium: There is no evidence of pericardial effusion. Mitral Valve: The mitral valve is normal in structure. Trivial mitral valve regurgitation. Tricuspid Valve: The tricuspid valve is normal in structure. Tricuspid valve regurgitation is not demonstrated. No evidence of tricuspid stenosis. Aortic Valve: The aortic valve is tricuspid. . There is mild thickening and moderate calcification of the aortic valve. Aortic valve regurgitation is trivial. No aortic stenosis is present. Mild aortic valve annular calcification. There is mild thickening of the aortic valve. There is moderate calcification of the aortic valve. Pulmonic Valve: The pulmonic valve was grossly normal. Pulmonic valve regurgitation is trivial. Aorta: The aortic root is normal in size and structure. Venous: The inferior vena cava was not well visualized. IAS/Shunts: No atrial level shunt detected by color flow Doppler.  LEFT VENTRICLE PLAX 2D LVIDd:         5.50 cm      Diastology LVIDs:         4.50 cm      LV e' lateral:   4.22 cm/s LV PW:         0.80 cm      LV E/e' lateral: 12.2 LV IVS:        0.90 cm      LV e' medial:    5.27 cm/s LVOT diam:     2.00 cm      LV E/e' medial:  9.8 LV SV:         57 LV SV Index:   35 LVOT Area:     3.14 cm  LV Volumes (MOD) LV vol d, MOD A2C: 210.0 ml LV vol d, MOD A4C: 160.0 ml LV vol s, MOD A2C: 136.0 ml LV vol s, MOD A4C: 124.0 ml LV SV MOD A2C:     74.0 ml LV SV MOD A4C:     160.0 ml LV SV MOD BP:      54.0  ml RIGHT VENTRICLE RV S prime:     12.30 cm/s TAPSE (M-mode): 2.2 cm LEFT ATRIUM             Index       RIGHT ATRIUM           Index LA diam:        3.60 cm 2.22 cm/m  RA Area:     14.80 cm LA Vol (A2C):   48.6 ml 30.03 ml/m RA Volume:   39.80 ml  24.59 ml/m LA Vol (A4C):   38.1 ml 23.54 ml/m LA Biplane Vol: 44.2 ml 27.31 ml/m  AORTIC VALVE LVOT Vmax:   108.00 cm/s LVOT Vmean:  66.000 cm/s LVOT VTI:    0.180 m  AORTA Ao Root diam: 3.30 cm MITRAL VALVE MV Area (PHT): 1.72 cm  SHUNTS MV Decel Time: 440 msec     Systemic VTI:  0.18 m MV E velocity: 51.40 cm/s   Systemic Diam: 2.00 cm MV A velocity: 113.00 cm/s MV E/A ratio:  0.45 Adrian Prows MD Electronically signed by Adrian Prows MD Signature Date/Time: 08/07/2019/3:00:09 PM    Final     Assessment/Plan  1. Pneumonia of left lung due to infectious organism, unspecified part of lung - continue Doxycycline and Florastor  2. Pulmonary embolism without acute cor pulmonale, unspecified chronicity, unspecified pulmonary embolism type (HCC) -Continue Eliquis  3. Acute combined systolic and diastolic heart failure (HCC) -Continue Coreg  4. Urinary retention - has Foley catheter and will follow up with urology for voiding trial  5. Iron deficiency anemia secondary to inadequate dietary iron intake Lab Results  Component Value Date   HGB 9.7 (A) 08/22/2019   -Continue ferrous sulfate      Family/ staff Communication: Discussed plan of care with charge nurse.  Labs/tests ordered: None  Goals of care:   Short-term care   Durenda Age, DNP, FNP-BC [facility] [phone] (Monday-Friday 8:00 a.m. - 5:00 p.m.) [phone] (after hours)

## 2019-08-28 ENCOUNTER — Encounter: Payer: Self-pay | Admitting: Adult Health

## 2019-08-28 NOTE — Progress Notes (Deleted)
Location:  [facility] Room Number: 222-A Place of Service:  SNF (31) Provider:  Kenard Gower, DNP, FNP-BC  Patient Care Team: Patient, No Pcp Per as PCP - General (General Practice) Patient, No Pcp Per (General Practice)  Extended Emergency Contact Information Primary Emergency Contact: Hippert,Jennifer  [place] Home Phone: [phone] Mobile Phone: [phone] Relation: Niece Secondary Emergency Contact: [place], [place] Mobile Phone: [phone] Relation: Brother  Code Status:  DNR  Goals of care: Advanced Directive information Advanced Directives 08/23/2019  Does Patient Have a Medical Advance Directive? Yes  Type of Advance Directive Out of facility DNR (pink MOST or yellow form)  Does patient want to make changes to medical advance directive? No - Patient declined  Copy of Healthcare Power of Attorney in Chart? -  Would patient like information on creating a medical advance directive? -  Pre-existing out of facility DNR order (yellow form or pink MOST form) Yellow form placed in chart (order not valid for inpatient use);Pink MOST form placed in chart (order not valid for inpatient use)  Some encounter information is confidential and restricted. Go to Review Flowsheets activity to see all data.     Chief Complaint  Patient presents with  . Discharge Note    Patient is seen for discharge from SNF    HPI:  Pt is a 79 y.o. male seen today for discharge from SNF. *** He has a PMH of PAD, essential HTN, dyslipidemia, history of DVT and PE on Eliquis, and cardiomyopathy with CHF and CAD. ***   Past Medical History:  Diagnosis Date  . Bilateral carotid artery disease (HCC)   . Cardiomyopathy (HCC) 05/2019  . CHF (congestive heart failure) (HCC)   . Critical lower limb ischemia   . DVT (deep venous thrombosis) (HCC)   . Dyspnea on exertion 05/30/2019  . Hyperlipidemia    takes Atorvastatin daily  . Hypertension    takes  Amlodipine and Ramipril daily  . Joint swelling   . Nocturia   . Peripheral arterial disease (HCC)   . Pulmonary embolism (HCC)    Refer to VQ scan.    Past Surgical History:  Procedure Laterality Date  . BACK SURGERY    . colonosocpy    . ENDARTERECTOMY FEMORAL Right 02/25/2015   Procedure: ENDARTERECTOMYRight  FEMORAL Endarterectomy with profundaplasty.;  Surgeon: Sherren Kerns, MD;  Location: [facility] OR;  Service: Vascular;  Laterality: Right;  . FEMORAL-POPLITEAL BYPASS GRAFT Right 02/25/2015   Procedure: BYPASS GRAFT FEMORAL-POPLITEAL ARTERY using non-reversed saphenous vein.;  Surgeon: Sherren Kerns, MD;  Location: [facility] OR;  Service: Vascular;  Laterality: Right;  . PERIPHERAL VASCULAR CATHETERIZATION N/A 01/24/2015   Procedure: Abdominal Aortogram;  Surgeon: Sherren Kerns, MD;  Location: [facility] INVASIVE CV LAB;  Service: Cardiovascular;  Laterality: N/A;    Allergies  Allergen Reactions  . Chocolate Hives  . Sulfur Rash    Outpatient Encounter Medications as of 08/28/2019  Medication Sig  . acetaminophen (TYLENOL) 325 MG tablet Take 650 mg by mouth every 6 (six) hours as needed.   Marland Kitchen albuterol (VENTOLIN HFA) 108 (90 Base) MCG/ACT inhaler Inhale 2 puffs into the lungs every 4 (four) hours as needed for wheezing or shortness of breath.   Marland Kitchen apixaban (ELIQUIS) 5 MG TABS tablet Take 5 mg by mouth 2 (two) times daily.  Marland Kitchen aspirin 81 MG chewable tablet Chew 81 mg by mouth daily.   Marland Kitchen atorvastatin (LIPITOR) 20 MG tablet Take 1 tablet (20 mg total)  by mouth daily.  . carvedilol (COREG) 3.125 MG tablet Take 3.125 mg by mouth 2 (two) times daily with a meal.  . ferrous sulfate 325 (65 FE) MG tablet Take 1 tablet (325 mg total) by mouth daily with breakfast.  . folic acid (FOLVITE) 1 MG tablet Take 1 tablet (1 mg total) by mouth daily.  . Nutritional Supplement LIQD Take 120 mLs by mouth 3 (three) times daily between meals. MedPass   No facility-administered encounter medications on file as  of 08/28/2019.    Review of Systems  GENERAL: No change in appetite, no fatigue, no weight changes, no fever, chills or weakness SKIN: Denies rash, itching, wounds, ulcer sores, or nail abnormalities EYES: Denies change in vision, dry eyes, eye pain, itching or discharge EARS: Denies change in hearing, ringing in ears, or earache NOSE: Denies nasal congestion or epistaxis MOUTH and THROAT: Denies oral discomfort, gingival pain or bleeding, pain from teeth or hoarseness   RESPIRATORY: no cough, SOB, DOE, wheezing, hemoptysis CARDIAC: No chest pain, edema or palpitations GI: No abdominal pain, diarrhea, constipation, heart burn, nausea or vomiting GU: Denies dysuria, frequency, hematuria, incontinence, or discharge MUSCULOSKELETAL: Denies joint pain, muscle pain, back pain, restricted movement, or unusual weakness CIRCULATION: Denies claudication, edema of legs, varicosities, or cold extremities NEUROLOGICAL: Denies dizziness, syncope, numbness, or headache PSYCHIATRIC: Denies feelings of depression or anxiety. No report of hallucinations, insomnia, paranoia, or agitation ENDOCRINE: Denies polyphagia, polyuria, polydipsia, heat or cold intolerance HEME/LYMPH: Denies excessive bruising, petechia, enlarged lymph nodes, or bleeding problems IMMUNOLOGIC: Denies history of frequent infections, AIDS, or use of immunosuppressive agents    There is no immunization history on file for this patient. Pertinent  Health Maintenance Due  Topic Date Due  . PNA vac Low Risk Adult (1 of 2 - PCV13) Never done  . INFLUENZA VACCINE  11/11/2019   No flowsheet data found.   Vitals:   08/28/19 1208  BP: 128/84  Pulse: 68  Resp: 18  Temp: (!) 97.1 F (36.2 C)  TempSrc: Oral  Weight: 112 lb 9.6 oz (51.1 kg)  Height: 5\' 8"  (1.727 m)   Body mass index is 17.12 kg/m.  Physical Exam  GENERAL APPEARANCE: Well nourished. In no acute distress. Normal body habitus SKIN:  Skin is warm and dry. There  are no suspicious lesions or rash HEAD: Normal in size and contour. No evidence of trauma EYES: Lids open and close normally. No blepharitis, entropion or ectropion. PERRL. Conjunctivae are clear and sclerae are white. Lenses are without opacity EARS: Pinnae are normal. Patient hears normal voice tunes of the examiner MOUTH and THROAT: Lips are without lesions. Oral mucosa is moist and without lesions. Tongue is normal in shape, size, and color and without lesions NECK: supple, trachea midline, no neck masses, no thyroid tenderness, no thyromegaly LYMPHATICS: No LAN in the neck, no supraclavicular LAN RESPIRATORY: Breathing is even & unlabored, BS CTAB CARDIAC: RRR, no murmur,no extra heart sounds, no edema GI: Abdomen soft, normal BS, no masses, no tenderness, no hepatomegaly, no splenomegaly MUSCULOSKELETAL: No deformities. Movement at each extremity is full and painless. Strength is 5/5 at each extremity. Back is without kyphosis or scoliosis CIRCULATION: Pedal pulses are 2+. There is no edema of the legs, ankles and feet NEUROLOGICAL: There is no tremor. Speech is clear PSYCHIATRIC: Alert and oriented X 3. Affect and behavior are appropriate  Labs reviewed: Recent Labs    06/21/19 1426 07/09/19 1350 08/04/19 0322 08/05/19 0245 08/06/19 0035 08/06/19 0035 08/07/19  1610 08/07/19 0218 08/08/19 0815 08/15/19 0000 08/22/19 0000  NA 134   < >  --    < > 141   < > 141   < > 142 141 142  K 4.8   < >  --    < > 3.7   < > 3.3*   < > 3.8 4.0 3.4  CL 97   < >  --    < > 102   < > 102   < > 102 102 103  CO2 23   < >  --    < > 31   < > 30   < > 29 23* 26*  GLUCOSE 110*   < >  --    < > 127*  --  94  --  100*  --   --   BUN 38*   < >  --    < > 59*   < > 31*   < > 18 32* 39*  CREATININE 1.49*   < >  --    < > 1.78*   < > 1.39*   < > 1.48* 1.7* 1.0  CALCIUM 9.1   < >  --    < > 8.3*   < > 8.7*   < > 9.0 9.2 9.4  MG 1.6  --  2.3  --  1.8  --   --   --   --   --   --   PHOS  --   --   --    --  2.8  --   --   --   --   --   --    < > = values in this interval not displayed.   Recent Labs    06/05/19 1528 07/09/19 1350 08/03/19 2223  AST 87* 42* 20  ALT 65* 37 19  ALKPHOS 110 113 63  BILITOT 0.8 1.1 0.5  PROT 6.7 7.2 4.5*  ALBUMIN 3.0* 2.4* 1.1*   Recent Labs    08/03/19 2223 08/03/19 2314 08/05/19 0245 08/05/19 0245 08/06/19 0035 08/06/19 0035 08/07/19 0218 08/15/19 0000 08/22/19 0000  WBC 14.0*   < > 11.1*   < > 9.3   < > 11.5* 12.6 6.9  NEUTROABS 12.8*  --   --   --   --   --   --  10 5  HGB 8.5*   < > 9.3*   < > 8.7*   < > 9.5* 10.7* 9.7*  HCT 29.2*   < > 30.9*   < > 28.7*   < > 31.2* 34* 30*  MCV 96.7   < > 91.7  --  92.6  --  92.0  --   --   PLT 429*   < > 386   < > 354   < > 332 392 358   < > = values in this interval not displayed.   No results found for: TSH No results found for: HGBA1C Lab Results  Component Value Date   CHOL 174 02/10/2015   HDL 42 02/10/2015   LDLCALC 105 02/10/2015   TRIG 137 02/10/2015   CHOLHDL 4.1 02/10/2015    Significant Diagnostic Results in last 30 days:  CT Head Wo Contrast  Result Date: 08/04/2019 CLINICAL DATA:  Lying on the floor. EXAM: CT HEAD WITHOUT CONTRAST TECHNIQUE: Contiguous axial images were obtained from the base of the skull through the vertex without intravenous contrast. COMPARISON:  None.  FINDINGS: Brain: There is mild cerebral atrophy with widening of the extra-axial spaces and ventricular dilatation. There are areas of decreased attenuation within the white matter tracts of the supratentorial brain, consistent with microvascular disease changes. Vascular: No hyperdense vessel or unexpected calcification. Skull: Normal. Negative for fracture or focal lesion. Sinuses/Orbits: No acute finding. Other: None. IMPRESSION: 1. Generalized cerebral atrophy. 2. No acute intracranial abnormality. Electronically Signed   By: Aram Candela M.D.   On: 08/04/2019 02:11   CT Chest W Contrast  Result Date:  08/04/2019 CLINICAL DATA:  Found on the floor. EXAM: CT CHEST WITH CONTRAST TECHNIQUE: Multidetector CT imaging of the chest was performed during intravenous contrast administration. CONTRAST:  75mL OMNIPAQUE IOHEXOL 300 MG/ML  SOLN COMPARISON:  None. FINDINGS: Cardiovascular: A moderate amount of intraluminal low attenuation is seen involving multiple lower lobe branches of the right pulmonary artery (axial CT images 92 through 99, CT series number 4). A mild amount of intraluminal low attenuation is also seen involving several upper lobe branches of the left pulmonary artery (axial CT images 82 through 86, CT series number 4). There is marked severity calcification of the thoracic aorta without evidence of aneurysmal dilatation or dissection. Normal heart size. No pericardial effusion. Marked severity coronary artery calcification is seen. Mediastinum/Nodes: A large amount of air is seen throughout the mediastinum. This is seen at the level of the lung bases and extends superiorly to involve the soft tissues posterior and lateral to the oropharynx. Lungs/Pleura: There is moderate to marked severity emphysematous lung disease. A 9.2 cm x 4.4 cm x 4.2 cm lobulated, thick walled cavitary lesion is seen along the posterolateral aspect of the left upper lobe. A 2.9 cm x 1.1 cm x 1.4 cm thin walled cavitary lesion is seen along the posterolateral aspect of the right middle lobe. A 9 mm noncalcified lung nodule is seen within the posterior aspect of the right lower lobe (axial CT image 93, CT series number 5). Upper Abdomen: A small amount of air density is seen posterior to the infrarenal abdominal aorta (axial CT images 153 through 157, CT series number 4), likely consistent with extension of the previously noted pneumomediastinum. Musculoskeletal: Multilevel degenerative changes seen throughout the thoracic spine. IMPRESSION: 1. Large amount of pneumomediastinum which extends superiorly to involve the soft tissues  posterior and lateral to the oropharynx. 2. Mild to moderate severity bilateral pulmonary embolism, right greater than left. 3. 9.2 cm x 4.4 cm x 4.2 cm lobulated, thick walled left upper lobe cavitary lesion. This may represent a large cavitary pneumonia, however, a cavitary neoplasm cannot be excluded. 4. Additional smaller right middle lobe cavitary lesion with a 9 mm noncalcified right lower lobe lung nodule. Attention on follow-up is recommended. 5. Moderate to marked severity emphysematous lung disease. Aortic Atherosclerosis (ICD10-I70.0) and Emphysema (ICD10-J43.9). Electronically Signed   By: Aram Candela M.D.   On: 08/04/2019 02:40   CT Cervical Spine Wo Contrast  Result Date: 08/04/2019 CLINICAL DATA:  Found on the floor. EXAM: CT CERVICAL SPINE WITHOUT CONTRAST TECHNIQUE: Multidetector CT imaging of the cervical spine was performed without intravenous contrast. Multiplanar CT image reconstructions were also generated. COMPARISON:  July 09, 2019 FINDINGS: Alignment: There is approximately 2 mm anterolisthesis of the C4 on C5 vertebral body, with approximately 3 mm anterolisthesis of the C5 on C6 vertebral body. 3 mm anterolisthesis of C7 on T1 is also noted. Skull base and vertebrae: No acute fracture. No primary bone lesion or focal pathologic process. Soft tissues  and spinal canal: No prevertebral fluid or swelling. No visible canal hematoma. Disc levels: Moderate severity endplate sclerosis is seen at the levels of C4-C5, C5-C6 and C6-C7. Moderate to marked severity intervertebral disc space narrowing is also seen at these levels. Marked severity posterior intervertebral disc space narrowing is seen at the level of C3-C4. There is marked severity bilateral multilevel facet joint hypertrophy. Upper chest: A marked amount of air is seen within the visualized portion of the superior mediastinum. This extends superiorly to involve the soft tissues along the posterior and posterolateral aspect of  the a moderate amount of soft tissue air is also seen along the supraclavicular region on the left. Oropharynx. Other: None. IMPRESSION: 1. Marked severity superior pneumomediastinum which extends superiorly to involve the soft tissues along the posterior and posterolateral aspect of the left supraclavicular region on the left. Further evaluation with a chest CT is recommended. 2. Marked severity multilevel degenerative changes, most prominent at the levels of C4-C5, C5-C6 and C6-C7. 3. 2 mm anterolisthesis of the C4 on C5 vertebral body, C5-C6 vertebral body, and C7-T1 vertebral bodies. Electronically Signed   By: Aram Candela M.D.   On: 08/04/2019 02:19   DG Chest Port 1 View  Result Date: 08/03/2019 CLINICAL DATA:  Altered mental status. EXAM: PORTABLE CHEST 1 VIEW COMPARISON:  Chest radiograph 07/09/2019. No prior chest CT. FINDINGS: Persistent airspace disease in the periphery of the left mid lung, now with air-filled component concerning for cavitation. Patchy opacities in the right lung have improved from prior exam. Normal heart size with unchanged mediastinal contours. Short lucency outlining the aortic arch was not seen on prior. Vertically oriented lucency projecting over the central left lung base. There is air in the left supraclavicular soft tissues. Possible tiny left pneumothorax. No significant pleural fluid. Underlying emphysema. No acute rib fractures are visualized. IMPRESSION: 1. Persistent airspace disease in the periphery of the left mid lung, now with air-filled component concerning for cavitation. Bilateral patchy lung opacities have otherwise improved from radiograph last month. 2. Multiple lucencies in the left hemithorax are subtle but suspicious for pneumothorax, including adjacent to the aortic arch, medial left lung base, and possibly left apex. 3. Soft tissue air in the left supraclavicular soft tissues. 4. Recommend further characterization with chest CT. These results were  called by telephone at the time of interpretation on 08/03/2019 at 10:46 pm to PA [facility] , who verbally acknowledged these results. Electronically Signed   By: Narda Rutherford M.D.   On: 08/03/2019 22:47   DG Swallowing Func-Speech Pathology  Result Date: 08/07/2019 Objective Swallowing Evaluation: Type of Study: Bedside Swallow Evaluation  Patient Details Name: PAPE PARSON MRN: [id number] Date of Birth: 04-Mar-1941 Today's Date: 08/07/2019 Time: SLP Start Time (ACUTE ONLY): 0850 -SLP Stop Time (ACUTE ONLY): 0919 SLP Time Calculation (min) (ACUTE ONLY): 29 min Past Medical History: Past Medical History: Diagnosis Date . Bilateral carotid artery disease (HCC)  . Cardiomyopathy (HCC) 05/2019 . CHF (congestive heart failure) (HCC)  . Critical lower limb ischemia  . DVT (deep venous thrombosis) (HCC)  . Dyspnea on exertion 05/30/2019 . Hyperlipidemia   takes Atorvastatin daily . Hypertension   takes Amlodipine and Ramipril daily . Joint swelling  . Nocturia  . Peripheral arterial disease (HCC)  . Pulmonary embolism (HCC)   Refer to VQ scan.  Past Surgical History: Past Surgical History: Procedure Laterality Date . BACK SURGERY   . colonosocpy   . ENDARTERECTOMY FEMORAL Right 02/25/2015  Procedure: ENDARTERECTOMYRight  FEMORAL Endarterectomy with profundaplasty.;  Surgeon: Elam Dutch, MD;  Location: [place];  Service: Vascular;  Laterality: Right; . FEMORAL-POPLITEAL BYPASS GRAFT Right 02/25/2015  Procedure: BYPASS GRAFT FEMORAL-POPLITEAL ARTERY using non-reversed saphenous vein.;  Surgeon: Elam Dutch, MD;  Location: [place];  Service: Vascular;  Laterality: Right; . PERIPHERAL VASCULAR CATHETERIZATION N/A 01/24/2015  Procedure: Abdominal Aortogram;  Surgeon: Elam Dutch, MD;  Location: [place] CV LAB;  Service: Cardiovascular;  Laterality: N/A; HPI: 79 yo male adm to [facility] with FTT, pneunomediastinum, cavitary lung lesions, bilateral PE, now on heparin per MD notes.  Per notes, pt's  daughter, wife and dog passed and concern for depression present.  Pt denies dysphagia.  Swallow evaluation ordered.  Subjective: pt awake in chair Assessment / Plan / Recommendation CHL IP CLINICAL IMPRESSIONS 08/07/2019 Clinical Impression Patient presents with sensormotor based oropharyngeal dysphagia.  Weak muscle contraction and decreased oral control results in poor oral bolus cohesion and pharyngeal retention/aspiration.  Lingual pumping observed across consistencies with delay - SLP counting 1, 2, 3 - did not appear to improve efficency.  Pt aspirated thin liquids due to decreased timing and adequacy of laryngeal closure.  Reflexive cough noted with aspiration but did not clear aspirates.  Chin tuck posture prevented aspiration but allowed laryngeal penetration of thin.  Pharyngeal retention noted across all consistencies due to weakness without adequate sensation/awareness.  Pt does intermittent propel vallecular residuals into oral cavity using tongue base and expectorate= admitting he has done this prior to admissoin.  SLP recommends to continue current diet with strict precautions and allow single tsps of thin with chin tuck (with staff only).  Pt's swallowing appears laborious for him and concern for nutritional adequacy present.  Using teach back, educated pt to findings/recommendations.  Thankful palliative is following.  Marland Kitchen SLP Visit Diagnosis Dysphagia, oropharyngeal phase (R13.12) Attention and concentration deficit following -- Frontal lobe and executive function deficit following -- Impact on safety and function Risk for inadequate nutrition/hydration;Moderate aspiration risk   CHL IP TREATMENT RECOMMENDATION 08/07/2019 Treatment Recommendations F/U MBS in --- days (Comment);Therapy as outlined in treatment plan below;Defer until completion of intrumental exam   Prognosis 08/07/2019 Prognosis for Safe Diet Advancement Guarded Barriers to Reach Goals Time post onset;Motivation Barriers/Prognosis  Comment -- CHL IP DIET RECOMMENDATION 08/07/2019 SLP Diet Recommendations Dysphagia 3 (Mech soft) solids;Nectar thick liquid Liquid Administration via Cup;Straw Medication Administration Crushed with puree Compensations Slow rate;Small sips/bites Postural Changes Seated upright at 90 degrees;Remain semi-upright after after feeds/meals (Comment)   CHL IP OTHER RECOMMENDATIONS 08/07/2019 Recommended Consults -- Oral Care Recommendations Oral care QID Other Recommendations Have oral suction available   No flowsheet data found.  CHL IP FREQUENCY AND DURATION 08/07/2019 Speech Therapy Frequency (ACUTE ONLY) min 2x/week Treatment Duration 2 weeks      CHL IP ORAL PHASE 08/07/2019 Oral Phase Impaired Oral - Pudding Teaspoon -- Oral - Pudding Cup -- Oral - Honey Teaspoon -- Oral - Honey Cup -- Oral - Nectar Teaspoon Decreased bolus cohesion;Weak lingual manipulation;Delayed oral transit;Reduced posterior propulsion Oral - Nectar Cup Decreased bolus cohesion;Weak lingual manipulation;Delayed oral transit;Reduced posterior propulsion Oral - Nectar Straw Decreased bolus cohesion;Weak lingual manipulation;Delayed oral transit;Reduced posterior propulsion Oral - Thin Teaspoon Decreased bolus cohesion;Weak lingual manipulation;Reduced posterior propulsion;Premature spillage Oral - Thin Cup Decreased bolus cohesion;Weak lingual manipulation;Delayed oral transit;Reduced posterior propulsion;Premature spillage Oral - Thin Straw Decreased bolus cohesion;Weak lingual manipulation;Delayed oral transit;Reduced posterior propulsion Oral - Puree Decreased bolus cohesion;Weak lingual manipulation;Delayed oral transit;Reduced posterior propulsion Oral -  Mech Soft Decreased bolus cohesion;Weak lingual manipulation;Delayed oral transit;Reduced posterior propulsion Oral - Regular -- Oral - Multi-Consistency -- Oral - Pill -- Oral Phase - Comment --  CHL IP PHARYNGEAL PHASE 08/07/2019 Pharyngeal Phase Impaired Pharyngeal- Pudding Teaspoon --  Pharyngeal -- Pharyngeal- Pudding Cup -- Pharyngeal -- Pharyngeal- Honey Teaspoon -- Pharyngeal -- Pharyngeal- Honey Cup -- Pharyngeal -- Pharyngeal- Nectar Teaspoon Reduced epiglottic inversion;Reduced pharyngeal peristalsis;Reduced laryngeal elevation;Pharyngeal residue - pyriform;Pharyngeal residue - valleculae Pharyngeal Material does not enter airway Pharyngeal- Nectar Cup Pharyngeal residue - valleculae;Pharyngeal residue - pyriform;Reduced pharyngeal peristalsis;Reduced epiglottic inversion;Reduced anterior laryngeal mobility;Reduced laryngeal elevation;Reduced airway/laryngeal closure;Reduced tongue base retraction Pharyngeal Material does not enter airway Pharyngeal- Nectar Straw Pharyngeal residue - valleculae;Pharyngeal residue - pyriform;Reduced pharyngeal peristalsis;Reduced epiglottic inversion;Reduced anterior laryngeal mobility;Reduced laryngeal elevation;Reduced airway/laryngeal closure Pharyngeal Material does not enter airway Pharyngeal- Thin Teaspoon Penetration/Aspiration before swallow;Trace aspiration;Pharyngeal residue - valleculae;Pharyngeal residue - pyriform Pharyngeal Material enters airway, passes BELOW cords and not ejected out despite cough attempt by patient Pharyngeal- Thin Cup Delayed swallow initiation-pyriform sinuses;Penetration/Aspiration during swallow;Trace aspiration;Pharyngeal residue - valleculae;Pharyngeal residue - pyriform Pharyngeal Material enters airway, passes BELOW cords and not ejected out despite cough attempt by patient Pharyngeal- Thin Straw Penetration/Aspiration during swallow;Pharyngeal residue - valleculae;Pharyngeal residue - pyriform;Reduced pharyngeal peristalsis;Reduced epiglottic inversion;Reduced anterior laryngeal mobility;Reduced laryngeal elevation;Reduced airway/laryngeal closure;Reduced tongue base retraction Pharyngeal Material enters airway, remains ABOVE vocal cords and not ejected out Pharyngeal- Puree Reduced pharyngeal peristalsis;Reduced  tongue base retraction;Reduced laryngeal elevation;Reduced anterior laryngeal mobility;Reduced airway/laryngeal closure;Pharyngeal residue - valleculae Pharyngeal Material does not enter airway Pharyngeal- Mechanical Soft Reduced laryngeal elevation;Reduced anterior laryngeal mobility;Reduced epiglottic inversion;Reduced pharyngeal peristalsis;Reduced airway/laryngeal closure;Reduced tongue base retraction;Pharyngeal residue - valleculae Pharyngeal Material does not enter airway Pharyngeal- Regular -- Pharyngeal -- Pharyngeal- Multi-consistency -- Pharyngeal -- Pharyngeal- Pill -- Pharyngeal -- Pharyngeal Comment chin tuck prevent aspiration but allowed laryngeal penetration of thin and pt needed total cues to perform; head turn left (pt leaning toward left) did not decrease  pharyngeal retention; pt tilts head posterior to aid oral transiting of boluses; pt propels boluses from pharynx into oral cavity and expectorates or re=swallows- stating he has done this prior to admission; pt with reflexive cough with aspiration events but cough did not clear aspirates  CHL IP CERVICAL ESOPHAGEAL PHASE 08/07/2019 Cervical Esophageal Phase Impaired Pudding Teaspoon -- Pudding Cup -- Honey Teaspoon -- Honey Cup -- Nectar Teaspoon -- Nectar Cup -- Nectar Straw -- Thin Teaspoon -- Thin Cup -- Thin Straw -- Puree -- Mechanical Soft -- Regular -- Multi-consistency -- Pill -- Cervical Esophageal Comment Prominent cricopharyngeus apparent x1 with nectar liquid did not impair barium flow; At end of testing,pt's esophagus appeared with retention distally without pt sensation Rolena Infante, MS [facility] Acute Rehab Services Office [phone] Chales Abrahams 08/07/2019, 10:00 AM              ECHOCARDIOGRAM COMPLETE  Result Date: 08/07/2019    ECHOCARDIOGRAM REPORT   Patient Name:   Kevin Reilly Goertz Date of Exam: 08/06/2019 Medical Rec #:  [id number]     Height:       68.0 in Accession #:    [id number]    Weight:       115.5 lb Date of Birth:   1941/02/26     BSA:          1.619 m Patient Age:    79 years      BP:           116/67 mmHg Patient Gender: M  HR:           81 bpm. Exam Location:  Inpatient Procedure: 2D Echo Indications:    Pulmonary Embolus 415.19 / I26.99  History:        Patient has prior history of Echocardiogram examinations, most                 recent 06/08/2019. CHF, CAD, PAD, Pulmonic Valve Disease; Risk                 Factors:Hypertension and Dyslipidemia. DVT, Bilateral carotid                 artery disease, Cardiomyopathy.  Sonographer:    Leta Jungling RDCS Referring Phys: [id number] VASUNDHRA RATHORE IMPRESSIONS  1. Left ventricular ejection fraction, by estimation, is 25 to 30%. The left ventricle has severely decreased function. The left ventricle demonstrates global hypokinesis. Left ventricular diastolic parameters are consistent with Grade I diastolic dysfunction (impaired relaxation).  2. Right ventricular systolic function is normal. The right ventricular size is normal.  3. Left atrial size was mildly dilated.  4. Right atrial size was mildly dilated.  5. The mitral valve is normal in structure. Trivial mitral valve regurgitation.  6. The aortic valve is tricuspid. Aortic valve regurgitation is trivial. No aortic stenosis is present. Mild aortic valve annular calcification. There is mild thickening of the aortic valve. There is moderate calcification of the aortic valve Comparison(s): No significant change from prior study. 06/08/2019. FINDINGS  Left Ventricle: Left ventricular ejection fraction, by estimation, is 25 to 30%. The left ventricle has severely decreased function. The left ventricle demonstrates global hypokinesis. The left ventricular internal cavity size was normal in size. There is no left ventricular hypertrophy. Left ventricular diastolic parameters are consistent with Grade I diastolic dysfunction (impaired relaxation). Normal left ventricular filling pressure. Right Ventricle: The right  ventricular size is normal. No increase in right ventricular wall thickness. Right ventricular systolic function is normal. Left Atrium: Left atrial size was mildly dilated. Right Atrium: Right atrial size was mildly dilated. Pericardium: There is no evidence of pericardial effusion. Mitral Valve: The mitral valve is normal in structure. Trivial mitral valve regurgitation. Tricuspid Valve: The tricuspid valve is normal in structure. Tricuspid valve regurgitation is not demonstrated. No evidence of tricuspid stenosis. Aortic Valve: The aortic valve is tricuspid. . There is mild thickening and moderate calcification of the aortic valve. Aortic valve regurgitation is trivial. No aortic stenosis is present. Mild aortic valve annular calcification. There is mild thickening of the aortic valve. There is moderate calcification of the aortic valve. Pulmonic Valve: The pulmonic valve was grossly normal. Pulmonic valve regurgitation is trivial. Aorta: The aortic root is normal in size and structure. Venous: The inferior vena cava was not well visualized. IAS/Shunts: No atrial level shunt detected by color flow Doppler.  LEFT VENTRICLE PLAX 2D LVIDd:         5.50 cm      Diastology LVIDs:         4.50 cm      LV e' lateral:   4.22 cm/s LV PW:         0.80 cm      LV E/e' lateral: 12.2 LV IVS:        0.90 cm      LV e' medial:    5.27 cm/s LVOT diam:     2.00 cm      LV E/e' medial:  9.8 LV SV:  57 LV SV Index:   35 LVOT Area:     3.14 cm  LV Volumes (MOD) LV vol d, MOD A2C: 210.0 ml LV vol d, MOD A4C: 160.0 ml LV vol s, MOD A2C: 136.0 ml LV vol s, MOD A4C: 124.0 ml LV SV MOD A2C:     74.0 ml LV SV MOD A4C:     160.0 ml LV SV MOD BP:      54.0 ml RIGHT VENTRICLE RV S prime:     12.30 cm/s TAPSE (M-mode): 2.2 cm LEFT ATRIUM             Index       RIGHT ATRIUM           Index LA diam:        3.60 cm 2.22 cm/m  RA Area:     14.80 cm LA Vol (A2C):   48.6 ml 30.03 ml/m RA Volume:   39.80 ml  24.59 ml/m LA Vol (A4C):    38.1 ml 23.54 ml/m LA Biplane Vol: 44.2 ml 27.31 ml/m  AORTIC VALVE LVOT Vmax:   108.00 cm/s LVOT Vmean:  66.000 cm/s LVOT VTI:    0.180 m  AORTA Ao Root diam: 3.30 cm MITRAL VALVE MV Area (PHT): 1.72 cm     SHUNTS MV Decel Time: 440 msec     Systemic VTI:  0.18 m MV E velocity: 51.40 cm/s   Systemic Diam: 2.00 cm MV A velocity: 113.00 cm/s MV E/A ratio:  0.45 Yates Decamp MD Electronically signed by Yates Decamp MD Signature Date/Time: 08/07/2019/3:00:09 PM    Final     Assessment/Plan ***   Family/ staff Communication: ***  Labs/tests ordered:  ***  Goals of care:   Discharge   Kenard Gower, DNP, FNP-BC [facility] [phone] (Monday-Friday 8:00 a.m. - 5:00 p.m.) [phone] (after hours)

## 2019-08-28 NOTE — Progress Notes (Signed)
This encounter was created in error - please disregard.

## 2019-09-04 LAB — COMPREHENSIVE METABOLIC PANEL
Calcium: 9.5 (ref 8.7–10.7)
Calcium: 9.5 (ref 8.7–10.7)

## 2019-09-04 LAB — BASIC METABOLIC PANEL
BUN: 27 — AB (ref 4–21)
CO2: 23 — AB (ref 13–22)
Chloride: 104 (ref 99–108)
Creatinine: 1.1 (ref 0.6–1.3)
Creatinine: 1.1 (ref 0.6–1.3)
Glucose: 83
Glucose: 83
Potassium: 3.6 (ref 3.4–5.3)
Sodium: 142 (ref 137–147)

## 2019-09-06 ENCOUNTER — Encounter: Payer: Self-pay | Admitting: Adult Health

## 2019-09-06 ENCOUNTER — Non-Acute Institutional Stay (SKILLED_NURSING_FACILITY): Payer: Medicare Other | Admitting: Adult Health

## 2019-09-06 DIAGNOSIS — R634 Abnormal weight loss: Secondary | ICD-10-CM | POA: Diagnosis not present

## 2019-09-06 DIAGNOSIS — R29818 Other symptoms and signs involving the nervous system: Secondary | ICD-10-CM | POA: Diagnosis not present

## 2019-09-06 DIAGNOSIS — I509 Heart failure, unspecified: Secondary | ICD-10-CM

## 2019-09-06 DIAGNOSIS — I2699 Other pulmonary embolism without acute cor pulmonale: Secondary | ICD-10-CM

## 2019-09-06 DIAGNOSIS — I10 Essential (primary) hypertension: Secondary | ICD-10-CM

## 2019-09-06 DIAGNOSIS — J984 Other disorders of lung: Secondary | ICD-10-CM | POA: Diagnosis not present

## 2019-09-06 DIAGNOSIS — R4189 Other symptoms and signs involving cognitive functions and awareness: Secondary | ICD-10-CM

## 2019-09-06 DIAGNOSIS — I5042 Chronic combined systolic (congestive) and diastolic (congestive) heart failure: Secondary | ICD-10-CM

## 2019-09-06 DIAGNOSIS — J982 Interstitial emphysema: Secondary | ICD-10-CM

## 2019-09-06 DIAGNOSIS — R338 Other retention of urine: Secondary | ICD-10-CM

## 2019-09-06 DIAGNOSIS — E782 Mixed hyperlipidemia: Secondary | ICD-10-CM

## 2019-09-06 MED ORDER — NUTRITIONAL SUPPLEMENT PO LIQD: 120.0000 mL | Freq: Three times a day (TID) | ORAL | 0 refills | Status: AC

## 2019-09-06 MED ORDER — FERROUS SULFATE 325 (65 FE) MG PO TABS: 325.0000 mg | ORAL_TABLET | Freq: Every day | ORAL | 0 refills | Status: AC

## 2019-09-06 MED ORDER — CARVEDILOL 3.125 MG PO TABS: 3.1250 mg | ORAL_TABLET | Freq: Two times a day (BID) | ORAL | 0 refills | Status: AC

## 2019-09-06 MED ORDER — APIXABAN 5 MG PO TABS: 5.0000 mg | ORAL_TABLET | Freq: Two times a day (BID) | ORAL | 0 refills | Status: AC

## 2019-09-06 MED ORDER — ASPIRIN 81 MG PO CHEW: 81.0000 mg | CHEWABLE_TABLET | Freq: Every day | ORAL | 0 refills | Status: AC

## 2019-09-06 MED ORDER — ACETAMINOPHEN 325 MG PO TABS: 650.0000 mg | ORAL_TABLET | Freq: Four times a day (QID) | ORAL | 0 refills | Status: AC | PRN

## 2019-09-06 MED ORDER — ATORVASTATIN CALCIUM 20 MG PO TABS: 20.0000 mg | ORAL_TABLET | Freq: Every day | ORAL | 0 refills | Status: AC

## 2019-09-06 MED ORDER — FOLIC ACID 1 MG PO TABS: 1.0000 mg | ORAL_TABLET | Freq: Every day | ORAL | 0 refills | Status: AC

## 2019-09-06 MED ORDER — ALBUTEROL SULFATE HFA 108 (90 BASE) MCG/ACT IN AERS: 2.0000 | INHALATION_SPRAY | RESPIRATORY_TRACT | 0 refills | Status: AC | PRN

## 2019-09-06 NOTE — Progress Notes (Signed)
Location:  [facility] Room Number: 222-A Place of Service:  SNF (31)  Provider: Fletcher Anon, NP  PCP: Patient, No Pcp Per Patient Care Team: Patient, No Pcp Per as PCP - General (General Practice) Patient, No Pcp Per (General Practice)  Extended Emergency Contact Information Primary Emergency Contact: Hippert,Jennifer  [place] Home Phone: [phone] Mobile Phone: [phone] Relation: Niece Secondary Emergency Contact: Phillipe, Eberhardt Mobile Phone: [phone] Relation: Brother  Code Status: DNR Goals of care:  Advanced Directive information Advanced Directives 08/28/2019  Does Patient Have a Medical Advance Directive? Yes  Type of Advance Directive Out of facility DNR (pink MOST or yellow form)  Does patient want to make changes to medical advance directive? No - Patient declined  Copy of Healthcare Power of Attorney in Chart? -  Would patient like information on creating a medical advance directive? -  Pre-existing out of facility DNR order (yellow form or pink MOST form) Yellow form placed in chart (order not valid for inpatient use)  Some encounter information is confidential and restricted. Go to Review Flowsheets activity to see all data.     Allergies  Allergen Reactions  . Chocolate Hives  . Sulfur Rash    Chief Complaint  Patient presents with  . Discharge Note    Patient is seen for discharge home from SNF    HPI:  79 y.o. male seen for discharge to home from [facility] and rehab. He was admitted to the hospital 08/03/19-08/08/19 due to AMS.  He was found lying on the floor at home and was confused. He lives alone and apparently was not eating and drinking well or going to doc apts. A CT of the head was done which showed no acute findings but cerebral atrophy was noted and a CT of the chest was done which showed bilateral pulmonary embolism, large amount of pneumomediastinum, suspected cavitary pneumonia, cavitary  neoplasm cannot be excluded.  He completed 5 days of Zosyn in the hospital. The pneumomediastinum was suspected to be trauma related. His sats have been in the 90's on room air. He was already on Eliquis when they found the PEs and was treated with a heparin drip. This was suspected to be due to non compliance.  He also had dysphagia and a mechanical soft diet with NTL recommended. Later after therapy with his diet was changed to thin liquid.  He is ambulatory but spends much of the day in bed. At [facility] he continues with a foley catheter. The urine appeared dark and therefore a BMP was ordered which was WNL. He continues to eat very little, has a flat affect.  He is now a DNR and hospice has been consulted due to his weight loss, neurocognitive deficit, and decline with weight loss.  He was seen by pulmonary on 5/4 and a CXR was ordered which identified a left infiltrate and doxycycline was ordered . At this time he has no cough, sob, and sats are WNL.    Past Medical History:  Diagnosis Date  . Bilateral carotid artery disease (HCC)   . Cardiomyopathy (HCC) 05/2019  . CHF (congestive heart failure) (HCC)   . Critical lower limb ischemia   . DVT (deep venous thrombosis) (HCC)   . Dyspnea on exertion 05/30/2019  . Hyperlipidemia    takes Atorvastatin daily  . Hypertension    takes Amlodipine and Ramipril daily  . Joint swelling   . Nocturia   . Peripheral arterial disease (HCC)   .  Pulmonary embolism (HCC)    Refer to VQ scan.     Past Surgical History:  Procedure Laterality Date  . BACK SURGERY    . colonosocpy    . ENDARTERECTOMY FEMORAL Right 02/25/2015   Procedure: ENDARTERECTOMYRight  FEMORAL Endarterectomy with profundaplasty.;  Surgeon: Sherren Kerns, MD;  Location: [facility] OR;  Service: Vascular;  Laterality: Right;  . FEMORAL-POPLITEAL BYPASS GRAFT Right 02/25/2015   Procedure: BYPASS GRAFT FEMORAL-POPLITEAL ARTERY using non-reversed saphenous vein.;  Surgeon: Sherren Kerns, MD;  Location: [facility] OR;  Service: Vascular;  Laterality: Right;  . PERIPHERAL VASCULAR CATHETERIZATION N/A 01/24/2015   Procedure: Abdominal Aortogram;  Surgeon: Sherren Kerns, MD;  Location: [facility] INVASIVE CV LAB;  Service: Cardiovascular;  Laterality: N/A;      reports that he has quit smoking. His smoking use included cigarettes. He has never used smokeless tobacco. He reports that he does not drink alcohol or use drugs. Social History   Socioeconomic History  . Marital status: Widowed    Spouse name: Not on file  . Number of children: 1  . Years of education: Not on file  . Highest education level: Not on file  Occupational History  . Not on file  Tobacco Use  . Smoking status: Former Smoker    Types: Cigarettes  . Smokeless tobacco: Never Used  Substance and Sexual Activity  . Alcohol use: No    Alcohol/week: 0.0 standard drinks  . Drug use: No  . Sexual activity: Not Currently    Birth control/protection: None  Other Topics Concern  . Not on file  Social History Narrative   1 deceased child   Social Determinants of Health   Financial Resource Strain:   . Difficulty of Paying Living Expenses:   Food Insecurity:   . Worried About Programme researcher, broadcasting/film/video in the Last Year:   . Barista in the Last Year:   Transportation Needs:   . Freight forwarder (Medical):   Marland Kitchen Lack of Transportation (Non-Medical):   Physical Activity:   . Days of Exercise per Week:   . Minutes of Exercise per Session:   Stress:   . Feeling of Stress :   Social Connections:   . Frequency of Communication with Friends and Family:   . Frequency of Social Gatherings with Friends and Family:   . Attends Religious Services:   . Active Member of Clubs or Organizations:   . Attends Banker Meetings:   Marland Kitchen Marital Status:   Intimate Partner Violence:   . Fear of Current or Ex-Partner:   . Emotionally Abused:   Marland Kitchen Physically Abused:   . Sexually Abused:    Functional  Status Survey:    Allergies  Allergen Reactions  . Chocolate Hives  . Sulfur Rash    Pertinent  Health Maintenance Due  Topic Date Due  . PNA vac Low Risk Adult (1 of 2 - PCV13) Never done  . INFLUENZA VACCINE  11/11/2019    Medications: Outpatient Encounter Medications as of 09/06/2019  Medication Sig  . acetaminophen (TYLENOL) 325 MG tablet Take 650 mg by mouth every 6 (six) hours as needed.   Marland Kitchen albuterol (VENTOLIN HFA) 108 (90 Base) MCG/ACT inhaler Inhale 2 puffs into the lungs every 4 (four) hours as needed for wheezing or shortness of breath.   Marland Kitchen apixaban (ELIQUIS) 5 MG TABS tablet Take 5 mg by mouth 2 (two) times daily.  Marland Kitchen aspirin 81 MG chewable tablet Chew 81 mg  by mouth daily.   Marland Kitchen atorvastatin (LIPITOR) 20 MG tablet Take 1 tablet (20 mg total) by mouth daily.  . carvedilol (COREG) 3.125 MG tablet Take 3.125 mg by mouth 2 (two) times daily with a meal.  . ferrous sulfate 325 (65 FE) MG tablet Take 1 tablet (325 mg total) by mouth daily with breakfast.  . folic acid (FOLVITE) 1 MG tablet Take 1 tablet (1 mg total) by mouth daily.  . Nutritional Supplement LIQD Take 120 mLs by mouth 3 (three) times daily between meals. MedPass   No facility-administered encounter medications on file as of 09/06/2019.    Review of Systems  Constitutional: Positive for appetite change and unexpected weight change. Negative for activity change, chills, diaphoresis, fatigue and fever.  Respiratory: Negative for cough, shortness of breath, wheezing and stridor.   Cardiovascular: Negative for chest pain, palpitations and leg swelling.  Gastrointestinal: Negative for abdominal distention, abdominal pain, constipation and diarrhea.  Genitourinary: Negative for difficulty urinating and dysuria.  Musculoskeletal: Positive for gait problem. Negative for arthralgias, back pain, joint swelling and myalgias.  Neurological: Negative for dizziness, seizures, syncope, facial asymmetry, speech difficulty,  weakness and headaches.  Hematological: Negative for adenopathy. Does not bruise/bleed easily.  Psychiatric/Behavioral: Positive for confusion. Negative for agitation and behavioral problems.    Vitals:   09/06/19 1503  BP: 119/80  Pulse: 81  Resp: 17  Temp: (!) 97 F (36.1 C)  TempSrc: Oral  SpO2: 98%  Weight: 109 lb 3.2 oz (49.5 kg)  Height: 5\' 8"  (1.727 m)   Body mass index is 16.6 kg/m. Physical Exam Vitals and nursing note reviewed.  Constitutional:      General: He is not in acute distress.    Appearance: He is not diaphoretic.     Comments: Frail thin male  HENT:     Head: Normocephalic and atraumatic.     Mouth/Throat:     Mouth: Mucous membranes are moist.     Pharynx: Oropharynx is clear.  Neck:     Thyroid: No thyromegaly.     Vascular: No JVD.     Trachea: No tracheal deviation.  Cardiovascular:     Rate and Rhythm: Normal rate and regular rhythm.     Heart sounds: No murmur.  Pulmonary:     Effort: Pulmonary effort is normal. No respiratory distress.     Breath sounds: Normal breath sounds. No wheezing.     Comments: Scattered rhonchi which cleared with throat clearing. Decreased bases.  Abdominal:     General: Bowel sounds are normal. There is no distension.     Palpations: Abdomen is soft.     Tenderness: There is no abdominal tenderness.  Musculoskeletal:     Right lower leg: No edema.     Left lower leg: No edema.  Lymphadenopathy:     Cervical: No cervical adenopathy.  Skin:    General: Skin is warm and dry.     Comments: Erythema to coccyx  Neurological:     Mental Status: He is alert.     Cranial Nerves: No cranial nerve deficit.     Comments: Oriented to self only. Able to f/c  Psychiatric:     Comments: flat     Labs reviewed: Basic Metabolic Panel: Recent Labs    06/21/19 1426 07/09/19 1350 08/04/19 0322 08/05/19 0245 08/06/19 0035 08/06/19 0035 08/07/19 0218 08/07/19 0218 08/08/19 0815 08/08/19 0815 08/15/19 0000  08/22/19 0000 09/04/19 0000  NA 134   < >  --    < >  141   < > 141   < > 142  --  141 142 142  K 4.8   < >  --    < > 3.7   < > 3.3*   < > 3.8   < > 4.0 3.4 3.6  CL 97   < >  --    < > 102   < > 102   < > 102   < > 102 103 104  CO2 23   < >  --    < > 31   < > 30   < > 29   < > 23* 26* 23*  GLUCOSE 110*   < >  --    < > 127*  --  94  --  100*  --   --   --   --   BUN 38*   < >  --    < > 59*   < > 31*   < > 18  --  32* 39* 27*  CREATININE 1.49*   < >  --    < > 1.78*   < > 1.39*   < > 1.48*  --  1.7* 1.0 1.1  1.1  CALCIUM 9.1   < >  --    < > 8.3*   < > 8.7*   < > 9.0   < > 9.2 9.4 9.5  9.5  MG 1.6  --  2.3  --  1.8  --   --   --   --   --   --   --   --   PHOS  --   --   --   --  2.8  --   --   --   --   --   --   --   --    < > = values in this interval not displayed.   Liver Function Tests: Recent Labs    06/05/19 1528 07/09/19 1350 08/03/19 2223  AST 87* 42* 20  ALT 65* 37 19  ALKPHOS 110 113 63  BILITOT 0.8 1.1 0.5  PROT 6.7 7.2 4.5*  ALBUMIN 3.0* 2.4* 1.1*   No results for input(s): LIPASE, AMYLASE in the last 8760 hours. Recent Labs    08/03/19 2224  AMMONIA 37*   CBC: Recent Labs    08/03/19 2223 08/03/19 2314 08/05/19 0245 08/05/19 0245 08/06/19 0035 08/06/19 0035 08/07/19 0218 08/15/19 0000 08/22/19 0000  WBC 14.0*   < > 11.1*   < > 9.3   < > 11.5* 12.6 6.9  NEUTROABS 12.8*  --   --   --   --   --   --  10 5  HGB 8.5*   < > 9.3*   < > 8.7*   < > 9.5* 10.7* 9.7*  HCT 29.2*   < > 30.9*   < > 28.7*   < > 31.2* 34* 30*  MCV 96.7   < > 91.7  --  92.6  --  92.0  --   --   PLT 429*   < > 386   < > 354   < > 332 392 358   < > = values in this interval not displayed.   Cardiac Enzymes: Recent Labs    07/09/19 1515 08/03/19 2223  CKTOTAL 442* 84   BNP: Invalid input(s): POCBNP CBG: Recent Labs    07/09/19 1349  GLUCAP 114*    Procedures  and Imaging Studies During Stay: No results found.  Assessment/Plan:    1. Cavitary lesion of lung He  was due for a CT of the chest f/u on 5/28 but this will be canceled due to his hospice status. He is doing home with 24 hr care and hospice support. DNR in place. Appears comfortable.   2. Weight loss Continue supplements tid, given his decline he would not likely benefit from additional treatment at this time (as per discussion with Dr. Linna Darner)  3. Neurocognitive deficits CT showed cerebral atrophy. No sure if he had underlying dementia prior to this but has not recovered and can not complete MMSE at this time.   4. Chronic combined systolic and diastolic heart failure (HCC) No issues with volume overload. Continue Coreg at this time.   5. Essential hypertension Controlled  6. Pneumomediastinum ([place]) No current issues, or further treatment indicated.   7. Pulmonary embolism without acute cor pulmonale, unspecified chronicity, unspecified pulmonary embolism type (HCC) Continue Eliquis 5 mg bid and and aspirin per cardiology   8. Acute urinary retention Failed voiding trial, continue foley cath and change monthly  9. Mixed hyperlipidemia Continues on lipitor with associated hx of carotid disease and heart disease but given his decline this could possibly be discontinued in f/u    Patient is being discharged with the following home health services:  Hospice  Patient is being discharged with the following durable medical equipment:  NA  Patient has been advised to f/u with their PCP in 1-2 weeks to for a transitions of care visit.  Social services at their facility was responsible for arranging this appointment.  Pt was provided with adequate prescriptions of noncontrolled medications to reach the scheduled appointment .  For controlled substances, a limited supply was provided as appropriate for the individual patient.  If the pt normally receives these medications from a pain clinic or has a contract with another physician, these medications should be received from that clinic or  physician only).    Future labs/tests needed:  NA under hospice care

## 2019-09-07 ENCOUNTER — Other Ambulatory Visit: Payer: Medicare Other

## 2019-09-11 ENCOUNTER — Ambulatory Visit: Payer: Medicare Other | Admitting: Adult Health

## 2019-09-11 ENCOUNTER — Other Ambulatory Visit: Payer: Medicare Other

## 2019-11-11 DEATH — deceased

## 2019-11-19 ENCOUNTER — Ambulatory Visit: Payer: Medicare Other | Admitting: Cardiology
